# Patient Record
Sex: Male | Born: 1945
Health system: Southern US, Community
[De-identification: ages and names within clinical notes are randomized; demographics above are authoritative.]

## PROBLEM LIST (undated history)

## (undated) DIAGNOSIS — M1990 Unspecified osteoarthritis, unspecified site: Secondary | ICD-10-CM

## (undated) DIAGNOSIS — E785 Hyperlipidemia, unspecified: Secondary | ICD-10-CM

## (undated) DIAGNOSIS — E119 Type 2 diabetes mellitus without complications: Secondary | ICD-10-CM

## (undated) DIAGNOSIS — H409 Unspecified glaucoma: Secondary | ICD-10-CM

## (undated) DIAGNOSIS — N419 Inflammatory disease of prostate, unspecified: Secondary | ICD-10-CM

## (undated) DIAGNOSIS — I10 Essential (primary) hypertension: Secondary | ICD-10-CM

## (undated) HISTORY — DX: Type 2 diabetes mellitus without complications: E11.9

## (undated) HISTORY — DX: Essential (primary) hypertension: I10

## (undated) HISTORY — DX: Unspecified glaucoma: H40.9

## (undated) HISTORY — DX: Hyperlipidemia, unspecified: E78.5

## (undated) HISTORY — PX: APPENDECTOMY: SHX54

## (undated) HISTORY — DX: Unspecified osteoarthritis, unspecified site: M19.90

---

## 2006-05-18 ENCOUNTER — Ambulatory Visit: Payer: Self-pay

## 2006-05-30 ENCOUNTER — Ambulatory Visit: Payer: Self-pay | Admitting: Internal Medicine

## 2006-06-08 ENCOUNTER — Ambulatory Visit: Payer: Self-pay | Admitting: Internal Medicine

## 2006-06-08 LAB — HM COLONOSCOPY

## 2011-07-13 ENCOUNTER — Encounter: Payer: Self-pay | Admitting: Internal Medicine

## 2011-09-30 ENCOUNTER — Telehealth: Payer: Self-pay

## 2011-09-30 NOTE — Telephone Encounter (Signed)
PT WOULD LIKE TO HAVE SOME TEST SCRIPTS CALLED IN FOR HIM. WE HAD NEVER GIVEN IT TO HIM BEFORE, BUT HE WAS IN A STUDY A WHILE BACK PLEASE CALL [phone]   [facility] ON [address]

## 2011-10-02 MED ORDER — GLUCOSE BLOOD VI STRP: ORAL_STRIP | Status: AC

## 2011-10-02 NOTE — Telephone Encounter (Signed)
Sent them to pharmacy.  We will need to see pt.

## 2011-10-02 NOTE — Telephone Encounter (Signed)
PT CHART IS AT NURSES STATION FOR REVIEW.  MR [id number]

## 2011-10-02 NOTE — Telephone Encounter (Signed)
LMOM THAT RX WAS SENT IN AND NEEDS OV

## 2011-10-06 ENCOUNTER — Other Ambulatory Visit: Payer: Self-pay | Admitting: Family Medicine

## 2012-01-02 ENCOUNTER — Telehealth: Payer: Self-pay | Admitting: Medical Oncology

## 2012-01-02 NOTE — Telephone Encounter (Signed)
Wrong chart

## 2012-02-12 ENCOUNTER — Other Ambulatory Visit: Payer: Self-pay | Admitting: Internal Medicine

## 2012-02-12 ENCOUNTER — Other Ambulatory Visit: Payer: Self-pay | Admitting: Physician Assistant

## 2012-02-12 NOTE — Telephone Encounter (Signed)
Chart pulled °

## 2012-02-12 NOTE — Telephone Encounter (Signed)
Need chart

## 2012-02-19 ENCOUNTER — Other Ambulatory Visit: Payer: Self-pay | Admitting: Internal Medicine

## 2012-02-26 ENCOUNTER — Ambulatory Visit (INDEPENDENT_AMBULATORY_CARE_PROVIDER_SITE_OTHER): Payer: Medicare Other | Admitting: Emergency Medicine

## 2012-02-26 ENCOUNTER — Ambulatory Visit: Payer: Medicare Other

## 2012-02-26 VITALS — BP 131/71 | HR 87 | Temp 98.3°F | Resp 17 | Ht 73.0 in | Wt 242.0 lb

## 2012-02-26 DIAGNOSIS — J329 Chronic sinusitis, unspecified: Secondary | ICD-10-CM

## 2012-02-26 DIAGNOSIS — L989 Disorder of the skin and subcutaneous tissue, unspecified: Secondary | ICD-10-CM | POA: Diagnosis not present

## 2012-02-26 DIAGNOSIS — K047 Periapical abscess without sinus: Secondary | ICD-10-CM

## 2012-02-26 MED ORDER — AMOXICILLIN-POT CLAVULANATE 875-125 MG PO TABS: 1.0000 | ORAL_TABLET | Freq: Two times a day (BID) | ORAL | Status: AC

## 2012-02-26 NOTE — Progress Notes (Signed)
  Subjective:    Patient ID: Christopher Bauer, male    DOB: April 29, 1946, 66 y.o.   MRN: [id number]  HPI patient enters with a four-day history of pain and swelling over the left side of his face. He had broken off On his left upper premolar and has not had it repaired yet. He denies. Nasal drainage. He also has a nonhealing lesion over the right side of his face the    Review of Systems     Objective:   Physical Exam TMs are normal. Examination of the nose is normal. Examination of the right side of the face reveals a 1.5 x 0.5 cm shallow ulcerated area with indurated margins. Examination mouth revealed an exposed root of his left upper first premolar  [facility] reading (PRIMARY) by  Dr. Cleta Alberts no evidence of sinusitis .       Assessment & Plan:  I think this probably is secondary to his dental abscess and not secondary to sinusitis. I have advised him to make an appointment to see the dermatologist to remove the skin lesion on the right side of his face.

## 2012-02-26 NOTE — Patient Instructions (Addendum)
Please call first thing in the morning and get an appointment as soon as possible with your dentist. We will call you with your appointment with the dermatologist.

## 2012-03-22 ENCOUNTER — Other Ambulatory Visit: Payer: Self-pay | Admitting: Surgery

## 2012-03-22 DIAGNOSIS — C44319 Basal cell carcinoma of skin of other parts of face: Secondary | ICD-10-CM | POA: Diagnosis not present

## 2012-03-22 DIAGNOSIS — L259 Unspecified contact dermatitis, unspecified cause: Secondary | ICD-10-CM | POA: Diagnosis not present

## 2012-03-22 DIAGNOSIS — C44211 Basal cell carcinoma of skin of unspecified ear and external auricular canal: Secondary | ICD-10-CM | POA: Diagnosis not present

## 2012-03-22 DIAGNOSIS — D485 Neoplasm of uncertain behavior of skin: Secondary | ICD-10-CM | POA: Diagnosis not present

## 2012-04-03 ENCOUNTER — Other Ambulatory Visit: Payer: Self-pay | Admitting: Physician Assistant

## 2012-04-04 ENCOUNTER — Encounter: Payer: Self-pay | Admitting: Internal Medicine

## 2012-04-09 ENCOUNTER — Other Ambulatory Visit: Payer: Self-pay | Admitting: Internal Medicine

## 2012-04-13 ENCOUNTER — Ambulatory Visit (INDEPENDENT_AMBULATORY_CARE_PROVIDER_SITE_OTHER): Payer: Medicare Other | Admitting: Family Medicine

## 2012-04-13 ENCOUNTER — Other Ambulatory Visit: Payer: Self-pay | Admitting: Radiology

## 2012-04-13 VITALS — BP 128/73 | HR 82 | Temp 98.0°F | Resp 16 | Ht 73.0 in | Wt 235.6 lb

## 2012-04-13 DIAGNOSIS — M1990 Unspecified osteoarthritis, unspecified site: Secondary | ICD-10-CM

## 2012-04-13 DIAGNOSIS — E7800 Pure hypercholesterolemia, unspecified: Secondary | ICD-10-CM | POA: Diagnosis not present

## 2012-04-13 DIAGNOSIS — E663 Overweight: Secondary | ICD-10-CM | POA: Insufficient documentation

## 2012-04-13 DIAGNOSIS — E119 Type 2 diabetes mellitus without complications: Secondary | ICD-10-CM

## 2012-04-13 DIAGNOSIS — M170 Bilateral primary osteoarthritis of knee: Secondary | ICD-10-CM

## 2012-04-13 LAB — COMPREHENSIVE METABOLIC PANEL
BUN: 15 mg/dL (ref 6–23)
CO2: 24 mEq/L (ref 19–32)
Calcium: 9.9 mg/dL (ref 8.4–10.5)
Chloride: 104 mEq/L (ref 96–112)
Creat: 0.75 mg/dL (ref 0.50–1.35)

## 2012-04-13 LAB — LIPID PANEL
Cholesterol: 196 mg/dL (ref 0–200)
HDL: 45 mg/dL (ref >39)
Total CHOL/HDL Ratio: 4.4 Ratio
Triglycerides: 126 mg/dL (ref <150)

## 2012-04-13 MED ORDER — GLIPIZIDE ER 2.5 MG PO TB24: 2.5000 mg | ORAL_TABLET | Freq: Every day | ORAL | Status: DC

## 2012-04-13 MED ORDER — METFORMIN HCL 1000 MG PO TABS: 1000.0000 mg | ORAL_TABLET | Freq: Two times a day (BID) | ORAL | Status: DC

## 2012-04-13 MED ORDER — MELOXICAM 15 MG PO TABS: 15.0000 mg | ORAL_TABLET | Freq: Every day | ORAL | Status: DC | PRN

## 2012-04-13 MED ORDER — LISINOPRIL-HYDROCHLOROTHIAZIDE 20-12.5 MG PO TABS: 1.0000 | ORAL_TABLET | Freq: Every day | ORAL | Status: DC

## 2012-04-13 MED ORDER — PRAVASTATIN SODIUM 40 MG PO TABS: 40.0000 mg | ORAL_TABLET | Freq: Every day | ORAL | Status: DC

## 2012-04-13 MED ORDER — METFORMIN HCL ER (MOD) 1000 MG PO TB24: 1000.0000 mg | ORAL_TABLET | Freq: Every day | ORAL | Status: DC

## 2012-04-13 NOTE — Progress Notes (Signed)
[facility] [address] [place] [address] [phone]  Date:  04/13/2012   Name:  Christopher Bauer   DOB:  28-Nov-1945   MRN:  [id number]  PCP:  Tally Due, MD    Chief Complaint: Diabetes   History of Present Illness:  Christopher Bauer is a 66 y.o. very pleasant male patient who presents with the following:  Here today for a DM check- up.  He was told at the pharmacy that he needed an office visit to get more medication.   He also needs a RF of his mobic which he uses for OA of the knees.  However, he sometimes needs to take 2 of these a day and wonders if he could be increased to the 15 mg dosage.  He does not use these everyday- more when he has to do a lot of driving or plays golf.  He plays a fair amount of golf for exercise.    He works at the [employer].  He will get his flu shot there this year.    There is no problem list on file for this patient.   No past medical history on file.  No past surgical history on file.  History  Substance Use Topics  . Smoking status: Current Every Day Smoker -- 0.8 packs/day for 50 years    Types: Cigarettes  . Smokeless tobacco: Not on file  . Alcohol Use: Not on file    No family history on file.  No Known Allergies  Medication list has been reviewed and updated.  Current Outpatient Prescriptions on File Prior to Visit  Medication Sig Dispense Refill  . lisinopril-hydrochlorothiazide (PRINZIDE,ZESTORETIC) 20-12.5 MG per tablet Take 1 tablet by mouth daily.      . metFORMIN (GLUMETZA) 1000 MG (MOD) 24 hr tablet Take 1,000 mg by mouth daily with breakfast.      . glipiZIDE (GLUCOTROL XL) 2.5 MG 24 hr tablet Take 1 tablet (2.5 mg total) by mouth daily. Needs office visit  30 tablet  0  . glucose blood test strip Use as instructed  100 each  0  . meloxicam (MOBIC) 7.5 MG tablet TAKE 1 TABLET BY MOUTH TWICE DAILY AS NEEDED FOR PAIN  60 tablet  0  . pravastatin (PRAVACHOL) 40 MG tablet TAKE ONE TABLET  BY MOUTH DAILY AT BEDTIME FOR CHOLESTEROL  90 tablet  0    Review of Systems:  As per HPI- otherwise negative.   Physical Examination: Filed Vitals:   04/13/12 0748  BP: 128/73  Pulse: 82  Temp: 98 F (36.7 C)  Resp: 16   Filed Vitals:   04/13/12 0748  Height: 6\' 1"  (1.854 m)  Weight: 235 lb 9.6 oz (106.867 kg)   Body mass index is 31.08 kg/(m^2). Ideal Body Weight: Weight in (lb) to have BMI = 25: 189.1   GEN: WDWN, NAD, Non-toxic, A & O x 3, overweight HEENT: Atraumatic, Normocephalic. Neck supple. No masses, No LAD. Ears and Nose: No external deformity. CV: RRR, No M/G/R. No JVD. No thrill. No extra heart sounds. PULM: CTA B, no wheezes, crackles, rhonchi. No retractions. No resp. distress. No accessory muscle use. EXTR: No c/c/e.  Knees: slight crepitus and increased size of joints due to OA. No heat or effusion.  NEURO Normal gait.  PSYCH: Normally interactive. Conversant. Not depressed or anxious appearing.  Calm demeanor.   Results for orders placed in visit on 04/13/12  POCT GLYCOSYLATED HEMOGLOBIN (HGB A1C)  Component Value Range   Hemoglobin A1C 6.1      Assessment and Plan: 1. Diabetes mellitus type II  POCT glycosylated hemoglobin (Hb A1C), glipiZIDE (GLUCOTROL XL) 2.5 MG 24 hr tablet, lisinopril-hydrochlorothiazide (PRINZIDE,ZESTORETIC) 20-12.5 MG per tablet, metFORMIN (GLUMETZA) 1000 MG (MOD) 24 hr tablet  2. High cholesterol  Comprehensive metabolic panel, Lipid panel, pravastatin (PRAVACHOL) 40 MG tablet  3. Osteoarthritis  meloxicam (MOBIC) 15 MG tablet    DM is well controlled, BP looks fine. Refilled all meds today.  Cautioned re: overuse of mobic which could lead to stomach ulcers.  He agreed to use this only when needed.  Otherwise will follow- up further with his lab results, and he will get his flu shot at work this fall.   Meds ordered this encounter  Medications  . DISCONTD: metFORMIN (GLUMETZA) 1000 MG (MOD) 24 hr tablet    Sig: Take  1,000 mg by mouth daily with breakfast.  . DISCONTD: lisinopril-hydrochlorothiazide (PRINZIDE,ZESTORETIC) 20-12.5 MG per tablet    Sig: Take 1 tablet by mouth daily.  Marland Kitchen glipiZIDE (GLUCOTROL XL) 2.5 MG 24 hr tablet    Sig: Take 1 tablet (2.5 mg total) by mouth daily.    Dispense:  90 tablet    Refill:  3  . lisinopril-hydrochlorothiazide (PRINZIDE,ZESTORETIC) 20-12.5 MG per tablet    Sig: Take 1 tablet by mouth daily.    Dispense:  90 tablet    Refill:  3  . metFORMIN (GLUMETZA) 1000 MG (MOD) 24 hr tablet    Sig: Take 1 tablet (1,000 mg total) by mouth daily with breakfast.    Dispense:  90 tablet    Refill:  3  . pravastatin (PRAVACHOL) 40 MG tablet    Sig: Take 1 tablet (40 mg total) by mouth daily.    Dispense:  90 tablet    Refill:  3  . meloxicam (MOBIC) 15 MG tablet    Sig: Take 1 tablet (15 mg total) by mouth daily as needed for pain.    Dispense:  90 tablet    Refill:  2     COPLAND,JESSICA, MD

## 2012-04-17 ENCOUNTER — Encounter: Payer: Self-pay | Admitting: Family Medicine

## 2012-12-02 ENCOUNTER — Other Ambulatory Visit: Payer: Self-pay | Admitting: Family Medicine

## 2013-01-24 ENCOUNTER — Other Ambulatory Visit: Payer: Self-pay | Admitting: Physician Assistant

## 2013-02-24 ENCOUNTER — Other Ambulatory Visit: Payer: Self-pay | Admitting: Family Medicine

## 2013-03-20 ENCOUNTER — Other Ambulatory Visit: Payer: Self-pay | Admitting: Physician Assistant

## 2013-04-10 ENCOUNTER — Other Ambulatory Visit: Payer: Self-pay | Admitting: Physician Assistant

## 2013-04-13 ENCOUNTER — Other Ambulatory Visit: Payer: Self-pay | Admitting: Family Medicine

## 2013-04-15 ENCOUNTER — Other Ambulatory Visit: Payer: Self-pay | Admitting: Internal Medicine

## 2013-04-21 ENCOUNTER — Other Ambulatory Visit: Payer: Self-pay | Admitting: Internal Medicine

## 2013-04-21 ENCOUNTER — Other Ambulatory Visit: Payer: Self-pay | Admitting: Physician Assistant

## 2013-04-26 ENCOUNTER — Ambulatory Visit (INDEPENDENT_AMBULATORY_CARE_PROVIDER_SITE_OTHER): Payer: Medicare Other | Admitting: Internal Medicine

## 2013-04-26 VITALS — BP 124/72 | HR 80 | Temp 98.2°F | Resp 16 | Ht 72.0 in | Wt 229.0 lb

## 2013-04-26 DIAGNOSIS — E7800 Pure hypercholesterolemia, unspecified: Secondary | ICD-10-CM

## 2013-04-26 DIAGNOSIS — Z79899 Other long term (current) drug therapy: Secondary | ICD-10-CM | POA: Diagnosis not present

## 2013-04-26 DIAGNOSIS — I10 Essential (primary) hypertension: Secondary | ICD-10-CM | POA: Diagnosis not present

## 2013-04-26 DIAGNOSIS — E119 Type 2 diabetes mellitus without complications: Secondary | ICD-10-CM

## 2013-04-26 DIAGNOSIS — E785 Hyperlipidemia, unspecified: Secondary | ICD-10-CM | POA: Diagnosis not present

## 2013-04-26 LAB — LIPID PANEL
Cholesterol: 216 mg/dL — ABNORMAL HIGH (ref 0–200)
HDL: 53 mg/dL (ref >39)
Total CHOL/HDL Ratio: 4.1 Ratio
Triglycerides: 108 mg/dL (ref <150)
VLDL: 22 mg/dL (ref 0–40)

## 2013-04-26 LAB — POCT URINALYSIS DIPSTICK
Glucose, UA: NEGATIVE
Ketones, UA: NEGATIVE
Nitrite, UA: NEGATIVE
Protein, UA: NEGATIVE
Spec Grav, UA: 1.025
Urobilinogen, UA: 0.2

## 2013-04-26 LAB — COMPREHENSIVE METABOLIC PANEL
ALT: 44 U/L (ref 0–53)
Albumin: 4.4 g/dL (ref 3.5–5.2)
Alkaline Phosphatase: 80 U/L (ref 39–117)
BUN: 15 mg/dL (ref 6–23)
CO2: 24 mEq/L (ref 19–32)
Calcium: 9.4 mg/dL (ref 8.4–10.5)
Chloride: 102 mEq/L (ref 96–112)
Creat: 0.73 mg/dL (ref 0.50–1.35)
Glucose, Bld: 116 mg/dL — ABNORMAL HIGH (ref 70–99)
Total Bilirubin: 0.8 mg/dL (ref 0.3–1.2)

## 2013-04-26 LAB — POCT CBC
Lymph, poc: 3.2 (ref 0.6–3.4)
MCHC: 31.7 g/dL — AB (ref 31.8–35.4)
MPV: 7.1 fL (ref 0–99.8)
POC Granulocyte: 8.3 — AB (ref 2–6.9)
POC LYMPH PERCENT: 26 %L (ref 10–50)
POC MID %: 5.6 %M (ref 0–12)
RDW, POC: 14.6 %

## 2013-04-26 LAB — POCT UA - MICROSCOPIC ONLY
Casts, Ur, LPF, POC: NEGATIVE
Crystals, Ur, HPF, POC: NEGATIVE
Mucus, UA: POSITIVE
Yeast, UA: NEGATIVE

## 2013-04-26 LAB — POCT GLYCOSYLATED HEMOGLOBIN (HGB A1C): Hemoglobin A1C: 112

## 2013-04-26 LAB — TSH: TSH: 0.981 u[IU]/mL (ref 0.350–4.500)

## 2013-04-26 MED ORDER — LISINOPRIL-HYDROCHLOROTHIAZIDE 20-12.5 MG PO TABS: 1.0000 | ORAL_TABLET | Freq: Every day | ORAL | Status: DC

## 2013-04-26 MED ORDER — METFORMIN HCL 1000 MG PO TABS: 1000.0000 mg | ORAL_TABLET | Freq: Two times a day (BID) | ORAL | Status: DC

## 2013-04-26 MED ORDER — GLIPIZIDE ER 2.5 MG PO TB24: 2.5000 mg | ORAL_TABLET | Freq: Every day | ORAL | Status: DC

## 2013-04-26 MED ORDER — PRAVASTATIN SODIUM 40 MG PO TABS: 40.0000 mg | ORAL_TABLET | Freq: Every day | ORAL | Status: DC

## 2013-04-26 NOTE — Progress Notes (Signed)
°  Subjective:    Patient ID: Christopher Bauer, male    DOB: March 28, 1946, 67 y.o.   MRN: [id number]  HPI 67 year old male presents with the need of a diabetes check and would also like medications to be refilled.    Review of Systems     Objective:   Physical Exam        Assessment & Plan:

## 2013-04-26 NOTE — Progress Notes (Signed)
  Subjective:    Patient ID: Christopher Bauer, male    DOB: February 19, 1946, 67 y.o.   MRN: [id number]  HPI Doing well diabetes controlled by home glucose hx. No problems with meds. Has lost 15 lbs Feels good.   Review of Systems Had prostate procedure with urologist, unsure what and why    Objective:   Physical Exam  Vitals reviewed. Constitutional: He is oriented to person, place, and time. He appears well-developed and well-nourished.  HENT:  Head: Normocephalic.  Eyes: EOM are normal.  Cardiovascular: Normal rate, regular rhythm and normal heart sounds.   Pulmonary/Chest: Effort normal and breath sounds normal.  Neurological: He is alert and oriented to person, place, and time. He has normal strength. No cranial nerve deficit or sensory deficit.  Skin: No rash noted.  Psychiatric: He has a normal mood and affect. His behavior is normal.          Assessment & Plan:  RF meds 1 yr CPE 6 mos.

## 2013-04-26 NOTE — Patient Instructions (Addendum)
2000 Calorie Diabetic Diet The 2000 calorie diabetic diet is designed for eating up to 2000 calories each day. Following this diet and making healthy meal choices can help improve overall health. It controls blood glucose (sugar) levels. It can also lower blood pressure and cholesterol. SERVING SIZES Measuring foods and serving sizes helps to make sure you are getting the right amount of food. The list below tells how big or small some common serving sizes are.  1 oz.........4 stacked dice.  3 oz.........Deck of cards.  1 tsp........Tip of little finger.  1 tbs........Thumb.  2 tbs........Golf ball.   cup.......Half of a fist.  1 cup........A fist. GUIDELINES FOR CHOOSING FOODS The goal of this diet is to eat a variety of foods and limit calories to 2000 each day. This can be done by choosing foods that are low in calories and fat. The diet also suggests eating small amounts of food often. Doing this helps control your blood glucose levels so they do not get too high or too low. Each meal or snack should contain a protein food source to help you feel more satisfied and to stabilize your blood glucose. Try to eat about the same amount of food around the same time each day. This includes weekend days, travel days, and days off work. Space your meals about 4 to 5 hours apart and add a snack between them if you wish. For example, a daily food plan could include breakfast, a morning snack, lunch, dinner, and an evening snack. Healthy meals and snacks include whole grains, vegetables, fruits, lean meats, poultry, fish, and dairy products. As you plan your meals, choose a variety of foods. Choose from the bread and starches, vegetables, fruit, dairy, and meat/protein groups. Examples of foods from each group are listed below with their suggested serving sizes. Use measuring cups and spoons to become familiar with what a healthy portion looks like. Bread and Starches Each serving equals 15 grams of  carbohydrates.  1 slice bread.   bagel.   cup or 1 cup cold cereal (unsweetened).   cup hot cereal or mashed potatoes.  1 small potato (size of a computer mouse).   cup cooked pasta or rice.   English muffin.  1 cup broth-based soup.  3 cups popcorn.  4 to 6 whole-wheat crackers.   cup cooked beans, peas, or corn. Vegetables Each serving equals 5 grams of carbohydrates.   cup cooked vegetables.  1 cup raw vegetables.   cup tomato juice. Fruit Each serving equals 15 grams of carbohydrates.  1 small apple, banana, or orange.  1  cup watermelon or strawberries.   cup applesauce (no sugar added).  2 tbs raisins.   banana.   cup unsweetened canned fruit.   cup unsweetened fruit juice. Dairy Each serving equals 12 to 15 grams of carbohydrates.  1 cup fat-free milk.  6 oz artificially sweetened yogurt.  1 cup buttermilk.  1 cup soy milk. Meat/Protein  1 large egg.  2 to 3 oz meat, poultry, or fish.   cup cottage cheese.  1 tbs peanut butter.   cup tofu.  1 oz cheese.   cup tuna canned in water. SAMPLE 2000 CALORIE DIET PLAN Breakfast  1 English muffin (2 carb servings).  Reduced fat cream cheese, 1 tbs.  1 scrambled egg.   grapefruit (1 carb serving).  Fat-free milk, 1 cup (1 carb serving). Morning Snack  Artificially sweetened yogurt, 6 oz (1 carb serving).  2 tbs chopped nuts.  1   small peach (1 carb serving). Lunch  Grilled chicken sandwich.  1 hamburger bun (2 carb servings).  2 oz chicken breast.  1 lettuce leaf.  2 slices tomato.  Reduced fat mayonnaise, 1 tbs.  Carrot sticks, 1 cup.  Celery, 1 cup.  1 small apple (1 carb serving).  Fat-free milk, 1 cup (1 carb serving). Afternoon Snack   cup low-fat cottage cheese.  1  cups strawberries (1 carb serving). Dinner  Steak fajitas.  2 oz lean steak.  1 whole-wheat tortilla, 8 inches (1  carb servings).  Shredded lettuce,   cup.  2 slices tomato.  Salsa,  cup.  Low-fat sour cream, 2 tbs.  Brown rice,  cup (1 carb serving).  1 small orange (1 carb serving). Evening Snack  4 reduced fat whole-wheat crackers (1 carb serving).  1 tbs peanut butter.  12 to 15 grapes (1 carb serving). MEAL PLAN Use this worksheet to help you make a daily meal plan based on the 2000 calorie diabetic diet suggestions. The total amount of carbohydrates in your meal or snack is more important than making sure you include all of the food groups at every meal or snack. If you are using this plan to help you control your blood glucose, you may interchange carbohydrate containing foods (dairy, starches, and fruits). Choose a variety of fresh foods of varying colors and flavors. You can choose from the following foods to build your day's meals:  11 Starches.  4 Vegetables.  3 Fruits.  3 Dairy.  8 oz Meat.  Up to 6 Fats. Your dietician can use this worksheet to help you decide how many servings and what types of foods are right for you. BREAKFAST Food Group and Servings / Food Choice Starches ___________________________________________ Dairy ______________________________________________ Fruit ______________________________________________ Meat ______________________________________________ Fat________________________________________________ LUNCH Food Group and Servings / Food Choice Starch _____________________________________________ Meat ______________________________________________ Vegetables _________________________________________ Fruit ______________________________________________ Dairy______________________________________________ Fat________________________________________________ Aura Fey Food Group and Servings / Food  Choice Starch________________________________________________ Meat_________________________________________________ Fruit__________________________________________________ Linford Arnold Group and Servings / Food Choice Starches ____________________________________________ Meat _______________________________________________ Dairy _______________________________________________ Vegetables __________________________________________ Fruit ________________________________________________ Fat_________________________________________________ Lollie Sails Food Group and Servings / Food Choice Fruit _______________________________________________ Meat _______________________________________________ Starch ______________________________________________ DAILY TOTALS Starches ________________________ Vegetables ______________________ Fruit ___________________________ Dairy ___________________________ Meat ___________________________ Fat _____________________________ Document Released: 01/17/2005 Document Revised: 09/19/2011 Document Reviewed: 02/02/2009 [employer] Patient Information 2014 [employer] Calorie Counting Diet A calorie counting diet requires you to eat the number of calories that are right for you in a day. Calories are the measurement of how much energy you get from the food you eat. Eating the right amount of calories is important for staying at a healthy weight. If you eat too many calories, your body will store them as fat and you may gain weight. If you eat too few calories, you may lose weight. Counting the number of calories you eat during a day will help you know if you are eating the right amount. A Registered Dietitian can determine how many calories you need in a day. The amount of calories needed varies from person to person. If your goal is to lose weight, you will need to eat fewer calories. Losing weight can benefit you if you are overweight or have health problems such  as heart disease, high blood pressure, or diabetes. If your goal is to gain weight, you will need to eat more calories. Gaining weight may be necessary if you have a certain health problem that causes your body to need more energy. TIPS Whether  you are increasing or decreasing the number of calories you eat during a day, it may be hard to get used to changes in what you eat and drink. The following are tips to help you keep track of the number of calories you eat.  Measure foods at home with measuring cups. This helps you know the amount of food and number of calories you are eating.  Restaurants often serve food in amounts that are larger than 1 serving. While eating out, estimate how many servings of a food you are given. For example, a serving of cooked rice is  cup or about the size of half of a fist. Knowing serving sizes will help you be aware of how much food you are eating at restaurants.  Ask for smaller portion sizes or child-size portions at restaurants.  Plan to eat half of a meal at a restaurant. Take the rest home or share the other half with a friend.  Read the Nutrition Facts panel on food labels for calorie content and serving size. You can find out how many servings are in a package, the size of a serving, and the number of calories each serving has.  For example, a package might contain 3 cookies. The Nutrition Facts panel on that package says that 1 serving is 1 cookie. Below that, it will say there are 3 servings in the container. The calories section of the Nutrition Facts label says there are 90 calories. This means there are 90 calories in 1 cookie (1 serving). If you eat 1 cookie you have eaten 90 calories. If you eat all 3 cookies, you have eaten 270 calories (3 servings x 90 calories = 270 calories). The list below tells you how big or small some common portion sizes are.  1 oz.........4 stacked dice.  3 oz........Marland KitchenDeck of cards.  1 tsp.......Marland KitchenTip of little  finger.  1 tbs......Marland KitchenMarland KitchenThumb.  2 tbs.......Marland KitchenGolf ball.   cup......Marland KitchenHalf of a fist.  1 cup.......Marland KitchenA fist. KEEP A FOOD LOG Write down every food item you eat, the amount you eat, and the number of calories in each food you eat during the day. At the end of the day, you can add up the total number of calories you have eaten. It may help to keep a list like the one below. Find out the calorie information by reading the Nutrition Facts panel on food labels. Breakfast  Bran cereal (1 cup, 110 calories).  Fat-free milk ( cup, 45 calories). Snack  Apple (1 medium, 80 calories). Lunch  Spinach (1 cup, 20 calories).  Tomato ( medium, 20 calories).  Chicken breast strips (3 oz, 165 calories).  Shredded cheddar cheese ( cup, 110 calories).  Light [place] dressing (2 tbs, 60 calories).  Whole-wheat bread (1 slice, 80 calories).  Tub margarine (1 tsp, 35 calories).  Vegetable soup (1 cup, 160 calories). Dinner  Pork chop (3 oz, 190 calories).  Brown rice (1 cup, 215 calories).  Steamed broccoli ( cup, 20 calories).  Strawberries (1  cup, 65 calories).  Whipped cream (1 tbs, 50 calories). Daily Calorie Total: 1425 Document Released: 06/27/2005 Document Revised: 09/19/2011 Document Reviewed: 12/22/2006 [facility] Patient Information 2014 [place], [place].

## 2013-04-27 LAB — MICROALBUMIN, URINE: Microalb, Ur: 1.07 mg/dL (ref 0.00–1.89)

## 2013-04-27 LAB — PSA, MEDICARE: PSA: 0.81 ng/mL (ref <=4.00)

## 2013-04-28 ENCOUNTER — Encounter: Payer: Self-pay | Admitting: *Deleted

## 2013-08-06 ENCOUNTER — Other Ambulatory Visit: Payer: Self-pay | Admitting: Family Medicine

## 2013-10-06 ENCOUNTER — Encounter: Payer: Self-pay | Admitting: Internal Medicine

## 2013-10-06 ENCOUNTER — Ambulatory Visit (INDEPENDENT_AMBULATORY_CARE_PROVIDER_SITE_OTHER): Payer: Medicare Other | Admitting: Internal Medicine

## 2013-10-06 VITALS — BP 142/72 | HR 121 | Temp 98.0°F | Ht 72.0 in | Wt 237.2 lb

## 2013-10-06 DIAGNOSIS — J029 Acute pharyngitis, unspecified: Secondary | ICD-10-CM | POA: Diagnosis not present

## 2013-10-06 DIAGNOSIS — M542 Cervicalgia: Secondary | ICD-10-CM | POA: Diagnosis not present

## 2013-10-06 DIAGNOSIS — E119 Type 2 diabetes mellitus without complications: Secondary | ICD-10-CM

## 2013-10-06 LAB — POCT CBC
GRANULOCYTE PERCENT: 65.8 % (ref 37–80)
HEMATOCRIT: 48.2 % (ref 43.5–53.7)
Hemoglobin: 15.5 g/dL (ref 14.1–18.1)
Lymph, poc: 3.1 (ref 0.6–3.4)
MCH, POC: 31 pg (ref 27–31.2)
MCHC: 32.2 g/dL (ref 31.8–35.4)
MCV: 96.4 fL (ref 80–97)
MID (cbc): 0.7 (ref 0–0.9)
MPV: 7.4 fL (ref 0–99.8)
PLATELET COUNT, POC: 483 10*3/uL — AB (ref 142–424)
POC GRANULOCYTE: 7.3 — AB (ref 2–6.9)
POC LYMPH %: 28 % (ref 10–50)
POC MID %: 6.2 %M (ref 0–12)
RBC: 5 M/uL (ref 4.69–6.13)
RDW, POC: 14.3 %
WBC: 11.1 10*3/uL — AB (ref 4.6–10.2)

## 2013-10-06 LAB — POCT RAPID STREP A (OFFICE): RAPID STREP A SCREEN: NEGATIVE

## 2013-10-06 LAB — GLUCOSE, POCT (MANUAL RESULT ENTRY): POC GLUCOSE: 169 mg/dL — AB (ref 70–99)

## 2013-10-06 LAB — POCT GLYCOSYLATED HEMOGLOBIN (HGB A1C): HEMOGLOBIN A1C: 5.7

## 2013-10-06 MED ORDER — AZITHROMYCIN 500 MG PO TABS: 500.0000 mg | ORAL_TABLET | Freq: Every day | ORAL | Status: DC

## 2013-10-06 MED ORDER — METHOCARBAMOL 750 MG PO TABS: 750.0000 mg | ORAL_TABLET | Freq: Four times a day (QID) | ORAL | Status: DC

## 2013-10-06 NOTE — Progress Notes (Signed)
   Subjective:    Patient ID: Christopher Bauer, male    DOB: 27-Aug-1945, 68 y.o.   MRN: [id number]  HPI 68 year old male complains of ear pain. He has had this pain for 3 days. It is only the right ear and it feels like it has fluid in it. He has difficulty turning his head to the right. He also states when he touches under his right earlobe he can feel pain going down his neck. He has tried Aleve for the pain but it only helped a little. Pain is deep right neck, no hearing loss. No fever, chills. Some pain with swallowing, no congestion or cough but does smoke   Review of Systems     Objective:   Physical Exam  Constitutional: He is oriented to person, place, and time. He appears well-developed and well-nourished. No distress.  HENT:  Head: Normocephalic.  Right Ear: External ear normal.  Left Ear: External ear normal.  Nose: Nose normal.  Mouth/Throat: Uvula is midline and mucous membranes are normal. Uvula swelling present. Posterior oropharyngeal erythema present. No oropharyngeal exudate.  Eyes: EOM are normal. Pupils are equal, round, and reactive to light.  Neck: Phonation normal. Tracheal tenderness and muscular tenderness present. Decreased range of motion present. No mass and no thyromegaly present.    Cardiovascular: Normal rate.   Pulmonary/Chest: Effort normal and breath sounds normal.  Musculoskeletal: He exhibits tenderness.       Cervical back: He exhibits decreased range of motion, tenderness, pain and spasm. He exhibits no bony tenderness, no swelling, no edema, no deformity, no laceration and normal pulse.  Neurological: He is alert and oriented to person, place, and time. He exhibits normal muscle tone. Coordination normal.  Psychiatric: He has a normal mood and affect. His behavior is normal. Judgment and thought content normal.     Results for orders placed in visit on 10/06/13  POCT CBC      Result Value Ref Range   WBC 11.1 (*) 4.6 - 10.2 K/uL   Lymph, poc 3.1   0.6 - 3.4   POC LYMPH PERCENT 28.0  10 - 50 %L   MID (cbc) 0.7  0 - 0.9   POC MID % 6.2  0 - 12 %M   POC Granulocyte 7.3 (*) 2 - 6.9   Granulocyte percent 65.8  37 - 80 %G   RBC 5.00  4.69 - 6.13 M/uL   Hemoglobin 15.5  14.1 - 18.1 g/dL   HCT, POC 48.2  43.5 - 53.7 %   MCV 96.4  80 - 97 fL   MCH, POC 31.0  27 - 31.2 pg   MCHC 32.2  31.8 - 35.4 g/dL   RDW, POC 14.3     Platelet Count, POC 483 (*) 142 - 424 K/uL   MPV 7.4  0 - 99.8 fL  GLUCOSE, POCT (MANUAL RESULT ENTRY)      Result Value Ref Range   POC Glucose 169 (*) 70 - 99 mg/dl  POCT GLYCOSYLATED HEMOGLOBIN (HGB A1C)      Result Value Ref Range   Hemoglobin A1C 5.7    POCT RAPID STREP A (OFFICE)      Result Value Ref Range   Rapid Strep A Screen Negative  Negative        Assessment & Plan:  Possible throat infection Quit smoking Zithromax 500mg  5d Robaxin 750mg  prn neck spasms

## 2013-10-06 NOTE — Progress Notes (Signed)
   Subjective:    Patient ID: Christopher Bauer, male    DOB: Nov 28, 1945, 68 y.o.   MRN: [id number]  HPI    Review of Systems     Objective:   Physical Exam        Assessment & Plan:

## 2013-10-06 NOTE — Patient Instructions (Signed)
Sore Throat A sore throat is pain, burning, irritation, or scratchiness of the throat. There is often pain or tenderness when swallowing or talking. A sore throat may be accompanied by other symptoms, such as coughing, sneezing, fever, and swollen neck glands. A sore throat is often the first sign of another sickness, such as a cold, flu, strep throat, or mononucleosis (commonly known as mono). Most sore throats go away without medical treatment. CAUSES  The most common causes of a sore throat include:  A viral infection, such as a cold, flu, or mono.  A bacterial infection, such as strep throat, tonsillitis, or whooping cough.  Seasonal allergies.  Dryness in the air.  Irritants, such as smoke or pollution.  Gastroesophageal reflux disease (GERD). HOME CARE INSTRUCTIONS   Only take over-the-counter medicines as directed by your caregiver.  Drink enough fluids to keep your urine clear or pale yellow.  Rest as needed.  Try using throat sprays, lozenges, or sucking on hard candy to ease any pain (if older than 4 years or as directed).  Sip warm liquids, such as broth, herbal tea, or warm water with honey to relieve pain temporarily. You may also eat or drink cold or frozen liquids such as frozen ice pops.  Gargle with salt water (mix 1 tsp salt with 8 oz of water).  Do not smoke and avoid secondhand smoke.  Put a cool-mist humidifier in your bedroom at night to moisten the air. You can also turn on a hot shower and sit in the bathroom with the door closed for 5 10 minutes. SEEK IMMEDIATE MEDICAL CARE IF:  You have difficulty breathing.  You are unable to swallow fluids, soft foods, or your saliva.  You have increased swelling in the throat.  Your sore throat does not get better in 7 days.  You have nausea and vomiting.  You have a fever or persistent symptoms for more than 2 3 days.  You have a fever and your symptoms suddenly get worse. MAKE SURE YOU:   Understand  these instructions.  Will watch your condition.  Will get help right away if you are not doing well or get worse. Document Released: 08/04/2004 Document Revised: 06/13/2012 Document Reviewed: 03/04/2012 [facility] Patient Information 2014 [place], [place]. Smoking Cessation Quitting smoking is important to your health and has many advantages. However, it is not always easy to quit since nicotine is a very addictive drug. Often times, people try 3 times or more before being able to quit. This document explains the best ways for you to prepare to quit smoking. Quitting takes hard work and a lot of effort, but you can do it. ADVANTAGES OF QUITTING SMOKING  You will live longer, feel better, and live better.  Your body will feel the impact of quitting smoking almost immediately.  Within 20 minutes, blood pressure decreases. Your pulse returns to its normal level.  After 8 hours, carbon monoxide levels in the blood return to normal. Your oxygen level increases.  After 24 hours, the chance of having a heart attack starts to decrease. Your breath, hair, and body stop smelling like smoke.  After 48 hours, damaged nerve endings begin to recover. Your sense of taste and smell improve.  After 72 hours, the body is virtually free of nicotine. Your bronchial tubes relax and breathing becomes easier.  After 2 to 12 weeks, lungs can hold more air. Exercise becomes easier and circulation improves.  The risk of having a heart attack, stroke, cancer, or lung  disease is greatly reduced.  After 1 year, the risk of coronary heart disease is cut in half.  After 5 years, the risk of stroke falls to the same as a nonsmoker.  After 10 years, the risk of lung cancer is cut in half and the risk of other cancers decreases significantly.  After 15 years, the risk of coronary heart disease drops, usually to the level of a nonsmoker.  If you are pregnant, quitting smoking will improve your chances of having a  healthy baby.  The people you live with, especially any children, will be healthier.  You will have extra money to spend on things other than cigarettes. QUESTIONS TO THINK ABOUT BEFORE ATTEMPTING TO QUIT You may want to talk about your answers with your caregiver.  Why do you want to quit?  If you tried to quit in the past, what helped and what did not?  What will be the most difficult situations for you after you quit? How will you plan to handle them?  Who can help you through the tough times? Your family? Friends? A caregiver?  What pleasures do you get from smoking? What ways can you still get pleasure if you quit? Here are some questions to ask your caregiver:  How can you help me to be successful at quitting?  What medicine do you think would be best for me and how should I take it?  What should I do if I need more help?  What is smoking withdrawal like? How can I get information on withdrawal? GET READY  Set a quit date.  Change your environment by getting rid of all cigarettes, ashtrays, matches, and lighters in your home, car, or work. Do not let people smoke in your home.  Review your past attempts to quit. Think about what worked and what did not. GET SUPPORT AND ENCOURAGEMENT You have a better chance of being successful if you have help. You can get support in many ways.  Tell your family, friends, and co-workers that you are going to quit and need their support. Ask them not to smoke around you.  Get individual, group, or telephone counseling and support. Programs are available at [employer] and health centers. Call your local health department for information about programs in your area.  Spiritual beliefs and practices may help some smokers quit.  Download a "quit meter" on your computer to keep track of quit statistics, such as how long you have gone without smoking, cigarettes not smoked, and money saved.  Get a self-help book about quitting smoking  and staying off of tobacco. [place] yourself from urges to smoke. Talk to someone, go for a walk, or occupy your time with a task.  Change your normal routine. Take a different route to work. Drink tea instead of coffee. Eat breakfast in a different place.  Reduce your stress. Take a hot bath, exercise, or read a book.  Plan something enjoyable to do every day. Reward yourself for not smoking.  Explore interactive web-based programs that specialize in helping you quit. GET MEDICINE AND USE IT CORRECTLY Medicines can help you stop smoking and decrease the urge to smoke. Combining medicine with the above behavioral methods and support can greatly increase your chances of successfully quitting smoking.  Nicotine replacement therapy helps deliver nicotine to your body without the negative effects and risks of smoking. Nicotine replacement therapy includes nicotine gum, lozenges, inhalers, nasal sprays, and skin patches. Some may  be available over-the-counter and others require a prescription.  Antidepressant medicine helps people abstain from smoking, but how this works is unknown. This medicine is available by prescription.  Nicotinic receptor partial agonist medicine simulates the effect of nicotine in your brain. This medicine is available by prescription. Ask your caregiver for advice about which medicines to use and how to use them based on your health history. Your caregiver will tell you what side effects to look out for if you choose to be on a medicine or therapy. Carefully read the information on the package. Do not use any other product containing nicotine while using a nicotine replacement product.  RELAPSE OR DIFFICULT SITUATIONS Most relapses occur within the first 3 months after quitting. Do not be discouraged if you start smoking again. Remember, most people try several times before finally quitting. You may have symptoms of withdrawal because your  body is used to nicotine. You may crave cigarettes, be irritable, feel very hungry, cough often, get headaches, or have difficulty concentrating. The withdrawal symptoms are only temporary. They are strongest when you first quit, but they will go away within 10 14 days. To reduce the chances of relapse, try to:  Avoid drinking alcohol. Drinking lowers your chances of successfully quitting.  Reduce the amount of caffeine you consume. Once you quit smoking, the amount of caffeine in your body increases and can give you symptoms, such as a rapid heartbeat, sweating, and anxiety.  Avoid smokers because they can make you want to smoke.  Do not let weight gain distract you. Many smokers will gain weight when they quit, usually less than 10 pounds. Eat a healthy diet and stay active. You can always lose the weight gained after you quit.  Find ways to improve your mood other than smoking. FOR MORE INFORMATION  www.smokefree.gov  Document Released: 06/21/2001 Document Revised: 12/27/2011 Document Reviewed: 10/06/2011 [facility] Patient Information 2014 [place], [place].

## 2014-04-13 ENCOUNTER — Other Ambulatory Visit: Payer: Self-pay | Admitting: Internal Medicine

## 2014-04-14 ENCOUNTER — Other Ambulatory Visit: Payer: Self-pay | Admitting: Family Medicine

## 2014-04-14 ENCOUNTER — Other Ambulatory Visit: Payer: Self-pay | Admitting: Internal Medicine

## 2014-04-23 ENCOUNTER — Telehealth: Payer: Self-pay | Admitting: *Deleted

## 2014-04-23 NOTE — Telephone Encounter (Signed)
Called patient and advised him to come to the walk in clinic at [address] for follow up on diabetes. Per patient he will come on his next day off from work.

## 2014-05-01 ENCOUNTER — Ambulatory Visit (INDEPENDENT_AMBULATORY_CARE_PROVIDER_SITE_OTHER): Payer: Medicare Other

## 2014-05-01 ENCOUNTER — Ambulatory Visit (INDEPENDENT_AMBULATORY_CARE_PROVIDER_SITE_OTHER): Payer: Medicare Other | Admitting: Emergency Medicine

## 2014-05-01 ENCOUNTER — Other Ambulatory Visit: Payer: Self-pay | Admitting: Emergency Medicine

## 2014-05-01 VITALS — BP 120/64 | HR 88 | Temp 98.3°F | Resp 18 | Ht 73.0 in | Wt 237.0 lb

## 2014-05-01 DIAGNOSIS — F17200 Nicotine dependence, unspecified, uncomplicated: Secondary | ICD-10-CM

## 2014-05-01 DIAGNOSIS — I10 Essential (primary) hypertension: Secondary | ICD-10-CM

## 2014-05-01 DIAGNOSIS — Z23 Encounter for immunization: Secondary | ICD-10-CM | POA: Diagnosis not present

## 2014-05-01 DIAGNOSIS — Z79899 Other long term (current) drug therapy: Secondary | ICD-10-CM

## 2014-05-01 DIAGNOSIS — R938 Abnormal findings on diagnostic imaging of other specified body structures: Secondary | ICD-10-CM

## 2014-05-01 DIAGNOSIS — Z720 Tobacco use: Secondary | ICD-10-CM | POA: Diagnosis not present

## 2014-05-01 DIAGNOSIS — E7800 Pure hypercholesterolemia, unspecified: Secondary | ICD-10-CM

## 2014-05-01 DIAGNOSIS — E119 Type 2 diabetes mellitus without complications: Secondary | ICD-10-CM | POA: Diagnosis not present

## 2014-05-01 DIAGNOSIS — Z7185 Encounter for immunization safety counseling: Secondary | ICD-10-CM

## 2014-05-01 DIAGNOSIS — Z7189 Other specified counseling: Secondary | ICD-10-CM | POA: Diagnosis not present

## 2014-05-01 DIAGNOSIS — E785 Hyperlipidemia, unspecified: Secondary | ICD-10-CM | POA: Diagnosis not present

## 2014-05-01 DIAGNOSIS — R9389 Abnormal findings on diagnostic imaging of other specified body structures: Secondary | ICD-10-CM | POA: Insufficient documentation

## 2014-05-01 LAB — COMPLETE METABOLIC PANEL WITH GFR
ALK PHOS: 65 U/L (ref 39–117)
ALT: 41 U/L (ref 0–53)
AST: 26 U/L (ref 0–37)
Albumin: 4 g/dL (ref 3.5–5.2)
BUN: 16 mg/dL (ref 6–23)
CALCIUM: 9.6 mg/dL (ref 8.4–10.5)
CO2: 23 mEq/L (ref 19–32)
Chloride: 105 mEq/L (ref 96–112)
Creat: 0.82 mg/dL (ref 0.50–1.35)
GFR, Est African American: >89 mL/min
GLUCOSE: 133 mg/dL — AB (ref 70–99)
POTASSIUM: 4.5 meq/L (ref 3.5–5.3)
Sodium: 137 mEq/L (ref 135–145)
Total Bilirubin: 0.7 mg/dL (ref 0.2–1.2)
Total Protein: 6.3 g/dL (ref 6.0–8.3)

## 2014-05-01 LAB — MICROALBUMIN, URINE: Microalb, Ur: 0.5 mg/dL (ref <2.0)

## 2014-05-01 LAB — POCT CBC
Granulocyte percent: 61.7 %G (ref 37–80)
HCT, POC: 48.8 % (ref 43.5–53.7)
HEMOGLOBIN: 15.9 g/dL (ref 14.1–18.1)
LYMPH, POC: 3.4 (ref 0.6–3.4)
MCH: 30.8 pg (ref 27–31.2)
MCHC: 32.6 g/dL (ref 31.8–35.4)
MCV: 94.4 fL (ref 80–97)
MID (cbc): 0.4 (ref 0–0.9)
MPV: 6.1 fL (ref 0–99.8)
POC Granulocyte: 6 (ref 2–6.9)
POC LYMPH %: 34.6 % (ref 10–50)
POC MID %: 3.7 % (ref 0–12)
Platelet Count, POC: 425 10*3/uL — AB (ref 142–424)
RBC: 5.17 M/uL (ref 4.69–6.13)
RDW, POC: 14.6 %
WBC: 9.8 10*3/uL (ref 4.6–10.2)

## 2014-05-01 LAB — LIPID PANEL
CHOL/HDL RATIO: 4.6 ratio
CHOLESTEROL: 201 mg/dL — AB (ref 0–200)
HDL: 44 mg/dL (ref >39)
LDL Cholesterol: 128 mg/dL — ABNORMAL HIGH (ref 0–99)
Triglycerides: 146 mg/dL (ref <150)
VLDL: 29 mg/dL (ref 0–40)

## 2014-05-01 LAB — GLUCOSE, POCT (MANUAL RESULT ENTRY): POC Glucose: 131 mg/dl — AB (ref 70–99)

## 2014-05-01 LAB — POCT GLYCOSYLATED HEMOGLOBIN (HGB A1C): Hemoglobin A1C: 5.7

## 2014-05-01 MED ORDER — METFORMIN HCL 1000 MG PO TABS: ORAL_TABLET | ORAL | Status: DC

## 2014-05-01 MED ORDER — LISINOPRIL-HYDROCHLOROTHIAZIDE 20-12.5 MG PO TABS: 1.0000 | ORAL_TABLET | Freq: Every day | ORAL | Status: DC

## 2014-05-01 MED ORDER — PRAVASTATIN SODIUM 40 MG PO TABS: 40.0000 mg | ORAL_TABLET | Freq: Every day | ORAL | Status: DC

## 2014-05-01 NOTE — Progress Notes (Addendum)
Subjective:    Patient ID: Christopher Bauer, male    DOB: Jun 12, 1946, 68 y.o.   MRN: [id number] This chart was scribed for Arlyss Queen, MD by Marti Sleigh, Medical Scribe. This patient was seen in Room 10 and the patient's care was started at 8:10 AM.  HPI HPI Comments: Christopher Bauer is a 68 y.o. male with a past hx of controlled DM and HLD who presents to the Emergency Department complaining of for a diabetes check up. Pt states he has been taking accidentally 1000mg  of metformin per day, which is double his prescription. Pt states he has felt fine, but recently realized that he was supposed to only take a half pill, and has been taking a whole pill. Pt states he has been checking his blood sugar, and it has been 107-108. Pt states last time he came in for his A1C was six months ago, and it was 5.7. Pt states he is not sure whether he has completed a colonoscopy in the last two years. Pt's previous PCP was Dr. Elder Cyphers. Pt smokes .5 PPD for the last 25 years. Pt states he would like a pneumonia vaccine shot.    Review of Systems  Constitutional: Negative for fever and chills.  Gastrointestinal: Negative for nausea, vomiting, diarrhea and constipation.  Skin: Negative for color change and rash.  Neurological: Negative for dizziness and headaches.       Objective:   Physical Exam  Nursing note and vitals reviewed. Constitutional: He is oriented to person, place, and time. He appears well-developed and well-nourished.  HENT:  Head: Normocephalic and atraumatic.  Eyes: Pupils are equal, round, and reactive to light.  Neck: No JVD present.  Cardiovascular: Normal rate and regular rhythm.   Pulmonary/Chest: Effort normal and breath sounds normal. No respiratory distress.  Neurological: He is alert and oriented to person, place, and time.  Skin: Skin is warm and dry.  Psychiatric: He has a normal mood and affect. His behavior is normal.   [facility] reading (PRIMARY) by  Dr.Decklan Mau there is some elevation  of the right hemidiaphragm. There is a questionable three-quarter centimeter nodule seen on lateral view please comment. Results for orders placed in visit on 05/01/14  POCT CBC      Result Value Ref Range   WBC 9.8  4.6 - 10.2 K/uL   Lymph, poc 3.4  0.6 - 3.4   POC LYMPH PERCENT 34.6  10 - 50 %L   MID (cbc) 0.4  0 - 0.9   POC MID % 3.7  0 - 12 %M   POC Granulocyte 6.0  2 - 6.9   Granulocyte percent 61.7  37 - 80 %G   RBC 5.17  4.69 - 6.13 M/uL   Hemoglobin 15.9  14.1 - 18.1 g/dL   HCT, POC 48.8  43.5 - 53.7 %   MCV 94.4  80 - 97 fL   MCH, POC 30.8  27 - 31.2 pg   MCHC 32.6  31.8 - 35.4 g/dL   RDW, POC 14.6     Platelet Count, POC 425 (*) 142 - 424 K/uL   MPV 6.1  0 - 99.8 fL  GLUCOSE, POCT (MANUAL RESULT ENTRY)      Result Value Ref Range   POC Glucose 131 (*) 70 - 99 mg/dl  POCT GLYCOSYLATED HEMOGLOBIN (HGB A1C)      Result Value Ref Range   Hemoglobin A1C 5.7     Meds ordered this encounter  Medications  . pravastatin (PRAVACHOL)  40 MG tablet    Sig: Take 1 tablet (40 mg total) by mouth daily.    Dispense:  90 tablet    Refill:  3  . lisinopril-hydrochlorothiazide (PRINZIDE,ZESTORETIC) 20-12.5 MG per tablet    Sig: Take 1 tablet by mouth daily.    Dispense:  90 tablet    Refill:  3  . metFORMIN (GLUCOPHAGE) 1000 MG tablet    Sig: Take one half tablet twice a day    Dispense:  90 tablet    Refill:  3    **Patient requests 90 days supply**      Assessment & Plan:  Patient doing great.. Other medication for refill. There is a suspicious nodule on chest x-ray.. Order CT chest. I stopped his sulfonylurea. He will take metformin at a half tablet twice a day. Prevnar was given.I personally performed the services described in this documentation, which was scribed in my presence. The recorded information has been reviewed and is accurate.

## 2014-05-12 ENCOUNTER — Ambulatory Visit
Admission: RE | Admit: 2014-05-12 | Discharge: 2014-05-12 | Disposition: A | Discharge status: Home / Self Care | Payer: Medicare Other | Source: Ambulatory Visit | Attending: Emergency Medicine | Admitting: Emergency Medicine

## 2014-05-12 DIAGNOSIS — R9389 Abnormal findings on diagnostic imaging of other specified body structures: Secondary | ICD-10-CM

## 2014-05-12 DIAGNOSIS — R918 Other nonspecific abnormal finding of lung field: Secondary | ICD-10-CM | POA: Diagnosis not present

## 2014-05-12 DIAGNOSIS — J984 Other disorders of lung: Secondary | ICD-10-CM | POA: Diagnosis not present

## 2014-06-26 ENCOUNTER — Telehealth: Payer: Self-pay

## 2014-06-26 NOTE — Telephone Encounter (Signed)
Called patient to inquire about his flu vaccine, and he states that he had the shot in October.

## 2014-09-29 LAB — HM DIABETES EYE EXAM

## 2014-11-08 ENCOUNTER — Other Ambulatory Visit: Payer: Self-pay | Admitting: Emergency Medicine

## 2014-11-10 ENCOUNTER — Other Ambulatory Visit: Payer: Self-pay | Admitting: Physician Assistant

## 2014-11-18 ENCOUNTER — Other Ambulatory Visit: Payer: Self-pay | Admitting: Emergency Medicine

## 2015-04-05 ENCOUNTER — Other Ambulatory Visit: Payer: Self-pay | Admitting: Physician Assistant

## 2015-04-05 ENCOUNTER — Ambulatory Visit (INDEPENDENT_AMBULATORY_CARE_PROVIDER_SITE_OTHER): Payer: Medicare Other | Admitting: Physician Assistant

## 2015-04-05 VITALS — BP 124/62 | HR 97 | Temp 98.3°F | Resp 16 | Ht 73.0 in | Wt 240.0 lb

## 2015-04-05 DIAGNOSIS — E7800 Pure hypercholesterolemia, unspecified: Secondary | ICD-10-CM

## 2015-04-05 DIAGNOSIS — M25569 Pain in unspecified knee: Secondary | ICD-10-CM

## 2015-04-05 DIAGNOSIS — L989 Disorder of the skin and subcutaneous tissue, unspecified: Secondary | ICD-10-CM | POA: Diagnosis not present

## 2015-04-05 DIAGNOSIS — Z79899 Other long term (current) drug therapy: Secondary | ICD-10-CM

## 2015-04-05 DIAGNOSIS — E119 Type 2 diabetes mellitus without complications: Secondary | ICD-10-CM | POA: Diagnosis not present

## 2015-04-05 DIAGNOSIS — E785 Hyperlipidemia, unspecified: Secondary | ICD-10-CM | POA: Diagnosis not present

## 2015-04-05 DIAGNOSIS — I10 Essential (primary) hypertension: Secondary | ICD-10-CM | POA: Diagnosis not present

## 2015-04-05 LAB — COMPLETE METABOLIC PANEL WITH GFR
ALT: 73 U/L — AB (ref 9–46)
AST: 55 U/L — ABNORMAL HIGH (ref 10–35)
Albumin: 4.1 g/dL (ref 3.6–5.1)
Alkaline Phosphatase: 74 U/L (ref 40–115)
BUN: 13 mg/dL (ref 7–25)
CALCIUM: 9.9 mg/dL (ref 8.6–10.3)
CHLORIDE: 101 mmol/L (ref 98–110)
CO2: 25 mmol/L (ref 20–31)
Creat: 0.75 mg/dL (ref 0.70–1.18)
Glucose, Bld: 134 mg/dL — ABNORMAL HIGH (ref 65–99)
POTASSIUM: 4.4 mmol/L (ref 3.5–5.3)
Sodium: 134 mmol/L — ABNORMAL LOW (ref 135–146)
Total Bilirubin: 0.6 mg/dL (ref 0.2–1.2)
Total Protein: 6.4 g/dL (ref 6.1–8.1)

## 2015-04-05 LAB — POCT CBC
GRANULOCYTE PERCENT: 67 % (ref 37–80)
HCT, POC: 49.2 % (ref 43.5–53.7)
Hemoglobin: 15.7 g/dL (ref 14.1–18.1)
Lymph, poc: 2.8 (ref 0.6–3.4)
MCH, POC: 29.4 pg (ref 27–31.2)
MCHC: 31.9 g/dL (ref 31.8–35.4)
MCV: 92.3 fL (ref 80–97)
MID (CBC): 0.7 (ref 0–0.9)
MPV: 6.2 fL (ref 0–99.8)
POC GRANULOCYTE: 7.1 — AB (ref 2–6.9)
POC LYMPH PERCENT: 26.7 %L (ref 10–50)
POC MID %: 6.3 % (ref 0–12)
Platelet Count, POC: 482 10*3/uL — AB (ref 142–424)
RBC: 5.33 M/uL (ref 4.69–6.13)
RDW, POC: 13.7 %
WBC: 10.6 10*3/uL — AB (ref 4.6–10.2)

## 2015-04-05 LAB — LIPID PANEL
CHOLESTEROL: 215 mg/dL — AB (ref 125–200)
HDL: 46 mg/dL (ref >=40)
LDL Cholesterol: 139 mg/dL — ABNORMAL HIGH (ref <130)
TRIGLYCERIDES: 148 mg/dL (ref <150)
Total CHOL/HDL Ratio: 4.7 Ratio (ref <=5.0)
VLDL: 30 mg/dL (ref <30)

## 2015-04-05 MED ORDER — MELOXICAM 15 MG PO TABS: 15.0000 mg | ORAL_TABLET | Freq: Every day | ORAL | Status: DC | PRN

## 2015-04-05 MED ORDER — PRAVASTATIN SODIUM 40 MG PO TABS: 40.0000 mg | ORAL_TABLET | Freq: Every day | ORAL | Status: DC

## 2015-04-05 MED ORDER — METFORMIN HCL 1000 MG PO TABS: ORAL_TABLET | ORAL | Status: DC

## 2015-04-05 NOTE — Progress Notes (Addendum)
[facility] [address] [place] [address] [phone]  Date:  04/05/2015   Name:  Christopher Bauer   DOB:  1945-07-23   MRN:  [id number]  PCP:  Kennon Portela, MD   Chief Complaint  Patient presents with  . Follow-up    Diabetes  . Medication Refill  . Rash    Bilateral Arms and face     History of Present Illness:  Christopher Bauer is a 69 y.o. male patient who presents to [facility] for chief complaint of rash of arms and right side of face, follow up of diabetes, and medication refill. Patient reports that his diabetes has been within normal range.  He denies any changes in vision, polyuria, fatigue, dizziness, or nausea.  He has not been exercising as of late due to schedule.  He has not had great dietary restriction. Medication refill of metformin and pravastatin.    Right side of face with open wound that has not healed for several years.  Same location with biopsy for basal cell carcinoma.  After the biopsy, the wound appeared like it had never wanted to close.  It is not painful unless he nicks it with his razor.  He has not followed up with the dermatologist.   He also complains of scars and open wounds along his arms.  They heel slowly into hypopigmented skin.  He has minimal sun exposure.    He also noticed change in mood.  He feels as if he is shorter fused.  He wakes in the morning and may feel agitated initially until some time passes.  He denies stressors, loss of interest, energy, sleep, appetite.  No HI.  He works at a [employer].  He has not had any altercations and does not feel these thoughts.    Patient Active Problem List   Diagnosis Date Noted  . Abnormal CXR 05/01/2014  . Diabetes mellitus type II 04/13/2012  . High cholesterol 04/13/2012  . Osteoarthritis of both knees 04/13/2012  . Overweight(278.02) 04/13/2012    Past Medical History  Diagnosis Date  . Arthritis   . Diabetes mellitus without complication   . Hypertension   .  Hyperlipidemia     Past Surgical History  Procedure Laterality Date  . Appendectomy      Social History  Substance Use Topics  . Smoking status: Current Some Day Smoker -- 0.25 packs/day for 50 years    Types: Cigarettes  . Smokeless tobacco: None  . Alcohol Use: 0.0 oz/week    0 Standard drinks or equivalent per week    Family History  Problem Relation Age of Onset  . Heart disease Mother     No Known Allergies  Medication list has been reviewed and updated.  Current Outpatient Prescriptions on File Prior to Visit  Medication Sig Dispense Refill  . aspirin 81 MG tablet Take 81 mg by mouth daily.    Marland Kitchen lisinopril-hydrochlorothiazide (PRINZIDE,ZESTORETIC) 20-12.5 MG per tablet Take 1 tablet by mouth daily. 90 tablet 3  . metFORMIN (GLUCOPHAGE) 1000 MG tablet TAKE 1 AND 1/2 TABLETS BY MOUTH TWICE DAILY.  "OV NEEDED FOR FURTHER REFILLS" 2ND 270 tablet 0  . pravastatin (PRAVACHOL) 40 MG tablet Take 1 tablet (40 mg total) by mouth daily. 90 tablet 3  . meloxicam (MOBIC) 15 MG tablet TAKE 1 TABLET BY MOUTH DAILY AS NEEDED FOR PAIN (Patient not taking: Reported on 04/05/2015) 30 tablet 0   No current facility-administered medications on file prior  to visit.    ROS ROS otherwise unremarkable unless listed above.   Physical Examination: BP 124/62 mmHg  Pulse 97  Temp(Src) 98.3 F (36.8 C) (Oral)  Resp 16  Ht 6\' 1"  (1.854 m)  Wt 240 lb (108.863 kg)  BMI 31.67 kg/m2  SpO2 96% Ideal Body Weight: Weight in (lb) to have BMI = 25: 189.1  Physical Exam  Constitutional: He is oriented to person, place, and time. He appears well-developed and well-nourished. No distress.  HENT:  Head: Normocephalic and atraumatic.  Eyes: Conjunctivae and EOM are normal. Pupils are equal, round, and reactive to light.  Cardiovascular: Normal rate, regular rhythm and normal heart sounds.  Exam reveals no gallop, no distant heart sounds and no friction rub.   Pulses:      Dorsalis pedis pulses are  2+ on the right side, and 2+ on the left side.  Pulmonary/Chest: Effort normal. No respiratory distress.  Musculoskeletal:       Right foot: There is normal range of motion and no deformity.       Left foot: There is normal range of motion and no deformity.  Feet:  Right Foot:  Protective Sensation: 5 sites tested.5 sites sensed. Skin Integrity: Negative for ulcer or blister.  Left Foot:  Protective Sensation: 5 sites tested. 5 sites sensed. Skin Integrity: Negative for ulcer or blister.  Neurological: He is alert and oriented to person, place, and time.  Skin: Skin is warm and dry. He is not diaphoretic.  Forearms with scarring and open abrasions consistent with excoriations.   Right preauricular with ulcerated lesion without inflammation, sharp borders.  No necrosis or erythema.    Psychiatric: He has a normal mood and affect. His behavior is normal.     Assessment and Plan: 69 year old male with PMH listed above that is here today for chief complaint of rash, medication refill, and diabetes recheck. Labs drawn today, rechecking hemoglobin a1c, and kidney function and lipids. Refill glucophage today Dermatology consult appreciated at this time.  Likely the basal cell may have returned.  Arms appear due to nightly scratching skin picking, likely while sleeping.    Type 2 diabetes mellitus without complication - Plan: POCT CBC, COMPLETE METABOLIC PANEL WITH GFR, Lipid panel, Hemoglobin A1c, pravastatin (PRAVACHOL) 40 MG tablet, metFORMIN (GLUCOPHAGE) 1000 MG tablet  Hyperlipidemia - Plan: Lipid panel, pravastatin (PRAVACHOL) 40 MG tablet  Essential hypertension - Plan: COMPLETE METABOLIC PANEL WITH GFR, Lipid panel, pravastatin (PRAVACHOL) 40 MG tablet  Skin lesion - Plan: Ambulatory referral to Dermatology  High cholesterol - Plan: pravastatin (PRAVACHOL) 40 MG tablet  Encounter for medication review - Plan: pravastatin (PRAVACHOL) 40 MG tablet  Knee joint pain, unspecified  laterality - Plan: DISCONTINUED: meloxicam (MOBIC) 15 MG tablet  Ivar Drape, PA-C [facility] 04/05/2015 8:24 AM

## 2015-04-05 NOTE — Patient Instructions (Signed)
I will contact you with the lab results as well as if I can find this medication. Please await contact regarding your appointment with dermatology. Make sure you attempt to exercise.  Water aerobics would be better for the knees.  [facility] the [facility].

## 2015-04-06 LAB — HEMOGLOBIN A1C
Hgb A1c MFr Bld: 7 % — ABNORMAL HIGH (ref <5.7)
Mean Plasma Glucose: 154 mg/dL — ABNORMAL HIGH (ref <117)

## 2015-04-16 DIAGNOSIS — C44319 Basal cell carcinoma of skin of other parts of face: Secondary | ICD-10-CM | POA: Diagnosis not present

## 2015-04-16 DIAGNOSIS — Z85828 Personal history of other malignant neoplasm of skin: Secondary | ICD-10-CM | POA: Diagnosis not present

## 2015-04-16 DIAGNOSIS — L281 Prurigo nodularis: Secondary | ICD-10-CM | POA: Diagnosis not present

## 2015-04-29 DIAGNOSIS — C44319 Basal cell carcinoma of skin of other parts of face: Secondary | ICD-10-CM | POA: Diagnosis not present

## 2015-04-29 DIAGNOSIS — Z85828 Personal history of other malignant neoplasm of skin: Secondary | ICD-10-CM | POA: Diagnosis not present

## 2015-05-07 DIAGNOSIS — Z4802 Encounter for removal of sutures: Secondary | ICD-10-CM | POA: Diagnosis not present

## 2015-05-08 ENCOUNTER — Encounter: Payer: Self-pay | Admitting: Family Medicine

## 2015-05-08 ENCOUNTER — Other Ambulatory Visit: Payer: Self-pay | Admitting: Emergency Medicine

## 2015-05-08 ENCOUNTER — Telehealth: Payer: Self-pay | Admitting: Family Medicine

## 2015-07-15 ENCOUNTER — Other Ambulatory Visit: Payer: Self-pay | Admitting: Physician Assistant

## 2015-07-15 ENCOUNTER — Telehealth: Payer: Self-pay

## 2015-07-15 DIAGNOSIS — E119 Type 2 diabetes mellitus without complications: Secondary | ICD-10-CM

## 2015-07-15 NOTE — Telephone Encounter (Signed)
Notes Recorded by Joretta Bachelor, PA on 04/29/2015 at 8:45 AM It was a pleasure to meet you. Your a1c has increased to 7.0. We need to exercise 4 times per week for at least 30 minutes of aerobic movement. This will help with weight loss which will help your glucose and cholesterol. This is also almost guaranteed to help with mood as well. Cholesterol has also mildly increased, and again exercise will help that. Avoid artifical juices and sugars. Avoid fried fatty foods and sweet teas. We can recheck this in 3 months. If you have concerns, let me know.       Ref Range       He was supposed to come back in three months.

## 2015-07-15 NOTE — Telephone Encounter (Signed)
Pt would like a refill on his metFORMIN (GLUCOPHAGE) 1000 MG tablet [id number]. Pharmacy:  [id number] - [place], [place] - [address] AT [address] CB # [phone]

## 2015-07-16 MED ORDER — METFORMIN HCL 1000 MG PO TABS: ORAL_TABLET | ORAL | Status: DC

## 2015-07-16 NOTE — Telephone Encounter (Signed)
Rx refilled.

## 2015-08-07 ENCOUNTER — Other Ambulatory Visit: Payer: Self-pay | Admitting: Physician Assistant

## 2015-09-30 ENCOUNTER — Telehealth: Payer: Self-pay | Admitting: Family Medicine

## 2015-09-30 NOTE — Telephone Encounter (Signed)
Patient returned call.  We updated his flu information.   He will not have another colonoscopy, he is not due until November 2017.

## 2015-09-30 NOTE — Telephone Encounter (Signed)
PATIENT WILL BE RETURNING CALL TO SEE IF HE HAS A COLONOSCOPY SINCE 2007.  IF HE HAS WHERE AND WHEN?  IF HE HAS NOT, SCHEDULE ONE WITH  [facility]

## 2015-10-10 ENCOUNTER — Other Ambulatory Visit: Payer: Self-pay | Admitting: Physician Assistant

## 2015-10-15 ENCOUNTER — Ambulatory Visit (INDEPENDENT_AMBULATORY_CARE_PROVIDER_SITE_OTHER): Payer: Medicare HMO

## 2015-10-15 ENCOUNTER — Encounter: Payer: Self-pay | Admitting: Emergency Medicine

## 2015-10-15 ENCOUNTER — Ambulatory Visit (INDEPENDENT_AMBULATORY_CARE_PROVIDER_SITE_OTHER): Payer: Medicare HMO | Admitting: Emergency Medicine

## 2015-10-15 VITALS — BP 120/58 | HR 102 | Temp 98.2°F | Resp 16 | Ht 72.0 in | Wt 231.4 lb

## 2015-10-15 DIAGNOSIS — F17200 Nicotine dependence, unspecified, uncomplicated: Secondary | ICD-10-CM

## 2015-10-15 DIAGNOSIS — C4490 Unspecified malignant neoplasm of skin, unspecified: Secondary | ICD-10-CM | POA: Diagnosis not present

## 2015-10-15 DIAGNOSIS — Z125 Encounter for screening for malignant neoplasm of prostate: Secondary | ICD-10-CM | POA: Diagnosis not present

## 2015-10-15 DIAGNOSIS — E785 Hyperlipidemia, unspecified: Secondary | ICD-10-CM | POA: Diagnosis not present

## 2015-10-15 DIAGNOSIS — E119 Type 2 diabetes mellitus without complications: Secondary | ICD-10-CM | POA: Diagnosis not present

## 2015-10-15 DIAGNOSIS — R945 Abnormal results of liver function studies: Secondary | ICD-10-CM

## 2015-10-15 DIAGNOSIS — Z720 Tobacco use: Secondary | ICD-10-CM | POA: Diagnosis not present

## 2015-10-15 DIAGNOSIS — E7800 Pure hypercholesterolemia, unspecified: Secondary | ICD-10-CM | POA: Diagnosis not present

## 2015-10-15 DIAGNOSIS — I10 Essential (primary) hypertension: Secondary | ICD-10-CM | POA: Diagnosis not present

## 2015-10-15 DIAGNOSIS — R799 Abnormal finding of blood chemistry, unspecified: Secondary | ICD-10-CM | POA: Diagnosis not present

## 2015-10-15 DIAGNOSIS — R7989 Other specified abnormal findings of blood chemistry: Secondary | ICD-10-CM | POA: Insufficient documentation

## 2015-10-15 DIAGNOSIS — Z79899 Other long term (current) drug therapy: Secondary | ICD-10-CM

## 2015-10-15 DIAGNOSIS — R918 Other nonspecific abnormal finding of lung field: Secondary | ICD-10-CM

## 2015-10-15 LAB — POC MICROSCOPIC URINALYSIS (UMFC): Mucus: ABSENT

## 2015-10-15 LAB — POCT URINALYSIS DIP (MANUAL ENTRY)
BILIRUBIN UA: NEGATIVE
Bilirubin, UA: NEGATIVE
Glucose, UA: NEGATIVE
LEUKOCYTES UA: NEGATIVE
NITRITE UA: NEGATIVE
Protein Ur, POC: NEGATIVE
SPEC GRAV UA: 1.025
UROBILINOGEN UA: 1
pH, UA: 5.5

## 2015-10-15 LAB — GLUCOSE, POCT (MANUAL RESULT ENTRY): POC Glucose: 102 mg/dL — AB (ref 70–99)

## 2015-10-15 LAB — COMPLETE METABOLIC PANEL WITH GFR
ALT: 29 U/L (ref 9–46)
AST: 20 U/L (ref 10–35)
Albumin: 3.9 g/dL (ref 3.6–5.1)
Alkaline Phosphatase: 73 U/L (ref 40–115)
BUN: 16 mg/dL (ref 7–25)
CHLORIDE: 102 mmol/L (ref 98–110)
CO2: 21 mmol/L (ref 20–31)
CREATININE: 0.71 mg/dL (ref 0.70–1.18)
Calcium: 9.4 mg/dL (ref 8.6–10.3)
GFR, Est African American: >89 mL/min (ref >=60)
GFR, Est Non African American: >89 mL/min (ref >=60)
Glucose, Bld: 128 mg/dL — ABNORMAL HIGH (ref 65–99)
Potassium: 4.7 mmol/L (ref 3.5–5.3)
SODIUM: 136 mmol/L (ref 135–146)
Total Bilirubin: 0.8 mg/dL (ref 0.2–1.2)
Total Protein: 6.4 g/dL (ref 6.1–8.1)

## 2015-10-15 LAB — LIPID PANEL
Cholesterol: 191 mg/dL (ref 125–200)
HDL: 39 mg/dL — AB (ref >=40)
LDL Cholesterol: 123 mg/dL (ref <130)
Total CHOL/HDL Ratio: 4.9 Ratio (ref <=5.0)
Triglycerides: 144 mg/dL (ref <150)
VLDL: 29 mg/dL (ref <30)

## 2015-10-15 LAB — POCT GLYCOSYLATED HEMOGLOBIN (HGB A1C): HEMOGLOBIN A1C: 6.1

## 2015-10-15 LAB — HEPATITIS C ANTIBODY: HCV Ab: NEGATIVE

## 2015-10-15 MED ORDER — PRAVASTATIN SODIUM 40 MG PO TABS: 40.0000 mg | ORAL_TABLET | Freq: Every day | ORAL | Status: DC

## 2015-10-15 MED ORDER — MELOXICAM 15 MG PO TABS: ORAL_TABLET | ORAL | Status: DC

## 2015-10-15 MED ORDER — LISINOPRIL-HYDROCHLOROTHIAZIDE 20-12.5 MG PO TABS: 1.0000 | ORAL_TABLET | Freq: Every day | ORAL | Status: DC

## 2015-10-15 MED ORDER — METFORMIN HCL 1000 MG PO TABS: ORAL_TABLET | ORAL | Status: DC

## 2015-10-15 NOTE — Progress Notes (Addendum)
Patient ID: Christopher Bauer, male   DOB: October 28, 1945, 70 y.o.   MRN: [id number]    By signing my name below, I, Essence Howell, attest that this documentation has been prepared under the direction and in the presence of Darlyne Russian, MD Electronically Signed: Ladene Artist, ED Scribe 10/15/2015 at 2:56 PM.  Chief Complaint:  Chief Complaint  Patient presents with  . Follow-up    DIABETES  . Medication Refill    METFORMIN,PRAVASTATIN, MOBIC, LISINOPRIL-HCTZ for 90 day   HPI: Christopher Bauer is a 70 y.o. male, with a h/o HTN, DM, hyperlipidemia, who reports to [facility] today for a follow-up regarding DM. Pt states that he feels well overall. Pt states that he has been eating healthier and losing weight to improve his A1C.   Wt Readings from Last 3 Encounters:  10/15/15 231 lb 6.4 oz (104.962 kg)  04/05/15 240 lb (108.863 kg)  05/01/14 237 lb (107.502 kg)   R Wrist Mas  Pt reports a non-tender mass to his right wrist for several weeks. He suspects that this is a ganglion cyst. Pt has a h/o ganglion cyst to left wrist and right foot that were removed several years ago.   Dermatology  Pt had a basal cell removed from the right side of his face in October 2016 by Dr. Elvera Lennox with [facility]. He plans to schedule another appointment with them for another skin spot that he recently noticed. Pt is only available on Thursdays. He states that this skin spot feels like a bee sting.   Smoker Pt is an active smoker; smoke 0.5 ppd. He denies chest pain at rest or on exertion. Pt takes a baby aspirin daily. He had a stress test done several years ago.   Preventative Maintenance  Pt is overdue for his annual eye exam. Pt's last colonoscopy was in October 2007; pt states that he does not desire to have another due to infection following his last colonoscopy.   Immunizations Pt received Prevnar 13 in 04/2014. He has not yet received the pneumovax vaccine.   Medication Refill  Pt requests a refill  of Metformin, pravastatin, mobic and lisipopril-HCTZ for 90 days.    Past Medical History  Diagnosis Date  . Arthritis   . Diabetes mellitus without complication   . Hypertension   . Hyperlipidemia    Past Surgical History  Procedure Laterality Date  . Appendectomy     Social History   Social History  . Marital Status: Single    Spouse Name: N/A  . Number of Children: N/A  . Years of Education: N/A   Social History Main Topics  . Smoking status: Current Some Day Smoker -- 0.25 packs/day for 50 years    Types: Cigarettes  . Smokeless tobacco: Not on file  . Alcohol Use: 0.0 oz/week    0 Standard drinks or equivalent per week  . Drug Use: No  . Sexual Activity: Not on file   Other Topics Concern  . Not on file   Social History Narrative   Family History  Problem Relation Age of Onset  . Heart disease Mother    No Known Allergies Prior to Admission medications   Medication Sig Start Date End Date Taking? Authorizing Provider  aspirin 81 MG tablet Take 81 mg by mouth daily.    Historical Provider, MD  lisinopril-hydrochlorothiazide (PRINZIDE,ZESTORETIC) 20-12.5 MG tablet TAKE 1 TABLET BY MOUTH EVERY DAY 05/12/15   Dorian Heckle English, PA  meloxicam (MOBIC) 15 MG tablet TAKE  1 TABLET(15 MG) BY MOUTH DAILY AS NEEDED FOR PAIN 04/06/15   Dorian Heckle English, PA  metFORMIN (GLUCOPHAGE) 1000 MG tablet TAKE 1 AND 1/2 TABLETS BY MOUTH TWICE DAILY( FOLLOW UP VISIT REQUIRED FOR MORE REFILLS) 10/11/15   Darlyne Russian, MD  pravastatin (PRAVACHOL) 40 MG tablet Take 1 tablet (40 mg total) by mouth daily. 04/05/15   Dorian Heckle English, PA   ROS: The patient denies fevers, chills, night sweats, unintentional weight loss, -chest pain, palpitations, wheezing, dyspnea on exertion, nausea, vomiting, abdominal pain, dysuria, hematuria, melena, numbness, weakness, or tingling. +cyst (R wrist)  All other systems have been reviewed and were otherwise negative with the exception of those mentioned  in the HPI and as above.    PHYSICAL EXAM: Filed Vitals:   10/15/15 1421  BP: 120/58  Pulse: 102  Temp: 98.2 F (36.8 C)  Resp: 16   Body mass index is 31.38 kg/(m^2).  General: Alert, no acute distress HEENT:  Normocephalic, atraumatic, oropharynx patent. R side of face: 1x1.5 cm crusty raised area suspicious for basal sub cancer.  Eye: Juliet[facility] M[facility] Cardiovascular: Tachycardic. No rubs, murmurs or gallops. No Carotid bruits, radial pulse intact. No pedal edema.  Respiratory: Clear to auscultation bilaterally. No wheezes, rales, or rhonchi. No cyanosis, no use of accessory musculature Abdominal: No organomegaly, abdomen is soft and non-tender, positive bowel sounds. No masses. Musculoskeletal: Gait intact. No edema, tenderness. R arm: 2x2 cm ganglion cyst. Skin: No rashes. Neurologic: Facial musculature symmetric. Psychiatric: Patient acts appropriately throughout our interaction. Lymphatic: No cervical or submandibular lymphadenopathy  LABS: Results for orders placed or performed in visit on 10/15/15  POCT glucose (manual entry)  Result Value Ref Range   POC Glucose 102 (A) 70 - 99 mg/dl  POCT glycosylated hemoglobin (Hb A1C)  Result Value Ref Range   Hemoglobin A1C 6.1   POCT urinalysis dipstick  Result Value Ref Range   Color, UA yellow yellow   Clarity, UA clear clear   Glucose, UA negative negative   Bilirubin, UA negative negative   Ketones, POC UA negative negative   Spec Grav, UA 1.025    Blood, UA trace-intact (A) negative   pH, UA 5.5    Protein Ur, POC negative negative   Urobilinogen, UA 1.0    Nitrite, UA Negative Negative   Leukocytes, UA Negative Negative  POCT Microscopic Urinalysis ([facility])  Result Value Ref Range   WBC,UR,HPF,POC None None WBC/hpf   RBC,UR,HPF,POC None None RBC/hpf   Bacteria None None, Too numerous to count   Mucus Absent Absent   Epithelial Cells, UR Per Microscopy Few (A) None, Too numerous to count cells/hpf     EKG/XRAY:     ASSESSMENT/PLAN: Referral made to cardiology for evaluation due to multiple risk factors. Patient advised to make an appointment at the dermatology office for appointment for  a skin cancer. Patient scheduled for CT chest follow-up pulmonary nodules. Patient agrees, back in 2 months to update his immunizations and discuss ways he can stop smoking.I personally performed the services described in this documentation, which was scribed in my presence. The recorded information has been reviewed and is accurate.    Gross sideeffects, risk and benefits, and alternatives of medications d/w patient. Patient is aware that all medications have potential sideeffects and we are unable to predict every sideeffect or drug-drug interaction that may occur.  Arlyss Queen MD 10/15/2015 2:35 PM

## 2015-10-15 NOTE — Patient Instructions (Addendum)
I decreased her metformin to 1 g twice a day. I have made a referral for you to see the cardiologist. I have made a referral for you to have a CT of your chest to follow-up on your smoking history.Smoking Cessation, Tips for Success If you are ready to quit smoking, congratulations! You have chosen to help yourself be healthier. Cigarettes bring nicotine, tar, carbon monoxide, and other irritants into your body. Your lungs, heart, and blood vessels will be able to work better without these poisons. There are many different ways to quit smoking. Nicotine gum, nicotine patches, a nicotine inhaler, or nicotine nasal spray can help with physical craving. Hypnosis, support groups, and medicines help break the habit of smoking. WHAT THINGS CAN I DO TO MAKE QUITTING EASIER?  Here are some tips to help you quit for good:  Pick a date when you will quit smoking completely. Tell all of your friends and family about your plan to quit on that date.  Do not try to slowly cut down on the number of cigarettes you are smoking. Pick a quit date and quit smoking completely starting on that day.  Throw away all cigarettes.   Clean and remove all ashtrays from your home, work, and car.  On a card, write down your reasons for quitting. Carry the card with you and read it when you get the urge to smoke.  Cleanse your body of nicotine. Drink enough water and fluids to keep your urine clear or pale yellow. Do this after quitting to flush the nicotine from your body.  Learn to predict your moods. Do not let a bad situation be your excuse to have a cigarette. Some situations in your life might tempt you into wanting a cigarette.  Never have "just one" cigarette. It leads to wanting another and another. Remind yourself of your decision to quit.  Change habits associated with smoking. If you smoked while driving or when feeling stressed, try other activities to replace smoking. Stand up when drinking your coffee. Brush  your teeth after eating. Sit in a different chair when you read the paper. Avoid alcohol while trying to quit, and try to drink fewer caffeinated beverages. Alcohol and caffeine may urge you to smoke.  Avoid foods and drinks that can trigger a desire to smoke, such as sugary or spicy foods and alcohol.  Ask people who smoke not to smoke around you.  Have something planned to do right after eating or having a cup of coffee. For example, plan to take a walk or exercise.  Try a relaxation exercise to calm you down and decrease your stress. Remember, you may be tense and nervous for the first 2 weeks after you quit, but this will pass.  Find new activities to keep your hands busy. Play with a pen, coin, or rubber band. Doodle or draw things on paper.  Brush your teeth right after eating. This will help cut down on the craving for the taste of tobacco after meals. You can also try mouthwash.   Use oral substitutes in place of cigarettes. Try using lemon drops, carrots, cinnamon sticks, or chewing gum. Keep them handy so they are available when you have the urge to smoke.  When you have the urge to smoke, try deep breathing.  Designate your home as a nonsmoking area.  If you are a heavy smoker, ask your health care provider about a prescription for nicotine chewing gum. It can ease your withdrawal from nicotine.  Reward  yourself. Set aside the cigarette money you save and buy yourself something nice.  Look for support from others. Join a support group or smoking cessation program. Ask someone at home or at work to help you with your plan to quit smoking.  Always ask yourself, "Do I need this cigarette or is this just a reflex?" Tell yourself, "Today, I choose not to smoke," or "I do not want to smoke." You are reminding yourself of your decision to quit.  Do not replace cigarette smoking with electronic cigarettes (commonly called e-cigarettes). The safety of e-cigarettes is unknown, and some  may contain harmful chemicals.  If you relapse, do not give up! Plan ahead and think about what you will do the next time you get the urge to smoke. HOW WILL I FEEL WHEN I QUIT SMOKING? You may have symptoms of withdrawal because your body is used to nicotine (the addictive substance in cigarettes). You may crave cigarettes, be irritable, feel very hungry, cough often, get headaches, or have difficulty concentrating. The withdrawal symptoms are only temporary. They are strongest when you first quit but will go away within 10-14 days. When withdrawal symptoms occur, stay in control. Think about your reasons for quitting. Remind yourself that these are signs that your body is healing and getting used to being without cigarettes. Remember that withdrawal symptoms are easier to treat than the major diseases that smoking can cause.  Even after the withdrawal is over, expect periodic urges to smoke. However, these cravings are generally short lived and will go away whether you smoke or not. Do not smoke! WHAT RESOURCES ARE AVAILABLE TO HELP ME QUIT SMOKING? Your health care provider can direct you to community resources or hospitals for support, which may include:  Group support.  Education.  Hypnosis.  Therapy.   This information is not intended to replace advice given to you by your health care provider. Make sure you discuss any questions you have with your health care provider.   Document Released: 03/25/2004 Document Revised: 07/18/2014 Document Reviewed: 12/13/2012 Elsevier Interactive Patient Education 2016 [employer].     IF you received an x-ray today, you will receive an invoice from [facility]. Please contact [facility] at [phone] with questions or concerns regarding your invoice.   IF you received labwork today, you will receive an invoice from [employer]. Please contact [facility] at [phone] with questions or concerns  regarding your invoice.   Our billing staff will not be able to assist you with questions regarding bills from these companies.  You will be contacted with the lab results as soon as they are available. The fastest way to get your results is to activate your My Chart account. Instructions are located on the last page of this paperwork. If you have not heard from [place] regarding the results in 2 weeks, please contact this office.

## 2015-10-16 LAB — PSA, MEDICARE: PSA: 0.77 ng/mL (ref <=4.00)

## 2015-10-16 LAB — MICROALBUMIN, URINE: Microalb, Ur: 0.3 mg/dL

## 2015-10-22 DIAGNOSIS — L821 Other seborrheic keratosis: Secondary | ICD-10-CM | POA: Diagnosis not present

## 2015-10-22 DIAGNOSIS — Z85828 Personal history of other malignant neoplasm of skin: Secondary | ICD-10-CM | POA: Diagnosis not present

## 2015-10-22 DIAGNOSIS — C44319 Basal cell carcinoma of skin of other parts of face: Secondary | ICD-10-CM | POA: Diagnosis not present

## 2015-11-26 ENCOUNTER — Ambulatory Visit
Admission: RE | Admit: 2015-11-26 | Discharge: 2015-11-26 | Disposition: A | Discharge status: Home / Self Care | Payer: Medicare HMO | Source: Ambulatory Visit | Attending: Emergency Medicine | Admitting: Emergency Medicine

## 2015-11-26 ENCOUNTER — Other Ambulatory Visit: Payer: Medicare Other

## 2015-11-26 ENCOUNTER — Other Ambulatory Visit: Payer: Self-pay | Admitting: Emergency Medicine

## 2015-11-26 DIAGNOSIS — R918 Other nonspecific abnormal finding of lung field: Secondary | ICD-10-CM | POA: Diagnosis not present

## 2015-11-26 DIAGNOSIS — I2584 Coronary atherosclerosis due to calcified coronary lesion: Principal | ICD-10-CM

## 2015-11-26 DIAGNOSIS — I2510 Atherosclerotic heart disease of native coronary artery without angina pectoris: Secondary | ICD-10-CM

## 2015-12-03 DIAGNOSIS — Z85828 Personal history of other malignant neoplasm of skin: Secondary | ICD-10-CM | POA: Diagnosis not present

## 2015-12-03 DIAGNOSIS — C44319 Basal cell carcinoma of skin of other parts of face: Secondary | ICD-10-CM | POA: Diagnosis not present

## 2015-12-24 ENCOUNTER — Encounter: Payer: Self-pay | Admitting: *Deleted

## 2016-02-04 ENCOUNTER — Ambulatory Visit (INDEPENDENT_AMBULATORY_CARE_PROVIDER_SITE_OTHER): Payer: Medicare HMO

## 2016-02-04 ENCOUNTER — Ambulatory Visit (INDEPENDENT_AMBULATORY_CARE_PROVIDER_SITE_OTHER): Payer: Medicare HMO | Admitting: Emergency Medicine

## 2016-02-04 ENCOUNTER — Encounter: Payer: Self-pay | Admitting: Emergency Medicine

## 2016-02-04 VITALS — BP 110/74 | HR 112 | Temp 97.8°F | Resp 18 | Ht 72.0 in | Wt 235.0 lb

## 2016-02-04 DIAGNOSIS — I2584 Coronary atherosclerosis due to calcified coronary lesion: Secondary | ICD-10-CM

## 2016-02-04 DIAGNOSIS — Z23 Encounter for immunization: Secondary | ICD-10-CM

## 2016-02-04 DIAGNOSIS — H547 Unspecified visual loss: Secondary | ICD-10-CM

## 2016-02-04 DIAGNOSIS — Z720 Tobacco use: Secondary | ICD-10-CM

## 2016-02-04 DIAGNOSIS — I10 Essential (primary) hypertension: Secondary | ICD-10-CM

## 2016-02-04 DIAGNOSIS — M7989 Other specified soft tissue disorders: Secondary | ICD-10-CM | POA: Diagnosis not present

## 2016-02-04 DIAGNOSIS — R938 Abnormal findings on diagnostic imaging of other specified body structures: Secondary | ICD-10-CM

## 2016-02-04 DIAGNOSIS — F17200 Nicotine dependence, unspecified, uncomplicated: Secondary | ICD-10-CM

## 2016-02-04 DIAGNOSIS — E119 Type 2 diabetes mellitus without complications: Secondary | ICD-10-CM | POA: Diagnosis not present

## 2016-02-04 DIAGNOSIS — R000 Tachycardia, unspecified: Secondary | ICD-10-CM

## 2016-02-04 DIAGNOSIS — M67431 Ganglion, right wrist: Secondary | ICD-10-CM

## 2016-02-04 DIAGNOSIS — M170 Bilateral primary osteoarthritis of knee: Secondary | ICD-10-CM | POA: Diagnosis not present

## 2016-02-04 DIAGNOSIS — E7800 Pure hypercholesterolemia, unspecified: Secondary | ICD-10-CM | POA: Diagnosis not present

## 2016-02-04 DIAGNOSIS — R9389 Abnormal findings on diagnostic imaging of other specified body structures: Secondary | ICD-10-CM

## 2016-02-04 DIAGNOSIS — I2510 Atherosclerotic heart disease of native coronary artery without angina pectoris: Secondary | ICD-10-CM | POA: Diagnosis not present

## 2016-02-04 LAB — COMPLETE METABOLIC PANEL WITH GFR
ALBUMIN: 4.1 g/dL (ref 3.6–5.1)
ALK PHOS: 75 U/L (ref 40–115)
ALT: 41 U/L (ref 9–46)
AST: 31 U/L (ref 10–35)
BILIRUBIN TOTAL: 0.7 mg/dL (ref 0.2–1.2)
BUN: 16 mg/dL (ref 7–25)
CO2: 21 mmol/L (ref 20–31)
CREATININE: 0.83 mg/dL (ref 0.70–1.18)
Calcium: 9.7 mg/dL (ref 8.6–10.3)
Chloride: 102 mmol/L (ref 98–110)
GFR, EST NON AFRICAN AMERICAN: 89 mL/min (ref >=60)
GFR, Est African American: >89 mL/min (ref >=60)
GLUCOSE: 180 mg/dL — AB (ref 65–99)
Potassium: 4.3 mmol/L (ref 3.5–5.3)
SODIUM: 134 mmol/L — AB (ref 135–146)
TOTAL PROTEIN: 6.6 g/dL (ref 6.1–8.1)

## 2016-02-04 LAB — POCT GLYCOSYLATED HEMOGLOBIN (HGB A1C): Hemoglobin A1C: 7.1

## 2016-02-04 LAB — GLUCOSE, POCT (MANUAL RESULT ENTRY): POC Glucose: 179 mg/dl — AB (ref 70–99)

## 2016-02-04 MED ORDER — DICLOFENAC SODIUM 1 % TD GEL: 4.0000 g | Freq: Four times a day (QID) | TRANSDERMAL | 3 refills | Status: DC

## 2016-02-04 NOTE — Patient Instructions (Signed)
     IF you received an x-ray today, you will receive an invoice from [facility]. Please contact [facility] at [phone] with questions or concerns regarding your invoice.   IF you received labwork today, you will receive an invoice from [facility]. Please contact [facility] at [phone] with questions or concerns regarding your invoice.   Our billing staff will not be able to assist you with questions regarding bills from these companies.  You will be contacted with the lab results as soon as they are available. The fastest way to get your results is to activate your My Chart account. Instructions are located on the last page of this paperwork. If you have not heard from us regarding the results in 2 weeks, please contact this office.      

## 2016-02-04 NOTE — Progress Notes (Signed)
By signing my name below, I, Moises Blood, attest that this documentation has been prepared under the direction and in the presence of Arlyss Queen, MD. Electronically Signed: Moises Blood, [place]. 02/04/2016 , 10:23 AM .  Patient was seen in room 2 .  Chief Complaint:  Chief Complaint  Patient presents with  . Follow-up    TRANSFER CARE/ MEDICATION QUESTIONS    HPI: Christopher Bauer is a 70 y.o. male who reports to [facility] today for follow up of the 2x2 cm ganglion cyst over his right wrist. Patient states the mass went away without doing anything different. He had a ganglion cyst over his left wrist surgically removed years ago.   Patient also requests referral for eye doctor. He was informed insurance would pay for it if a referral is written. He denies any vision changes.   Patient had a CT chest done on 11/26/15, which showed stable multiple opacities. Recommended follow up CT in 6-12 months. Patient has an appointment with cardiology set up per phone message.   He receives his annual flu shot for free at [employer].   Patient refuses further colonoscopies because he had an infection after having one done previously.   Past Medical History:  Diagnosis Date  . Arthritis   . Diabetes mellitus without complication ([place])   . Hyperlipidemia   . Hypertension    Past Surgical History:  Procedure Laterality Date  . APPENDECTOMY     Social History   Social History  . Marital status: Single    Spouse name: N/A  . Number of children: N/A  . Years of education: N/A   Social History Main Topics  . Smoking status: Current Some Day Smoker    Packs/day: 0.25    Years: 50.00    Types: Cigarettes  . Smokeless tobacco: Never Used  . Alcohol use 0.0 oz/week  . Drug use: No  . Sexual activity: Not Asked   Other Topics Concern  . None   Social History Narrative  . None   Family History  Problem Relation Age of Onset  . Heart disease Mother    No Known  Allergies Prior to Admission medications   Medication Sig Start Date End Date Taking? Authorizing Provider  aspirin 81 MG tablet Take 81 mg by mouth daily.   Yes Historical Provider, MD  lisinopril-hydrochlorothiazide (PRINZIDE,ZESTORETIC) 20-12.5 MG tablet Take 1 tablet by mouth daily. 10/15/15  Yes Darlyne Russian, MD  meloxicam (MOBIC) 15 MG tablet TAKE 1 TABLET(15 MG) BY MOUTH DAILY AS NEEDED FOR PAIN 10/15/15  Yes Darlyne Russian, MD  metFORMIN (GLUCOPHAGE) 1000 MG tablet Take 1 tablet by mouth daily 10/15/15  Yes Darlyne Russian, MD  pravastatin (PRAVACHOL) 40 MG tablet Take 1 tablet (40 mg total) by mouth daily. 10/15/15  Yes Darlyne Russian, MD     ROS:  Constitutional: negative for fever, chills, night sweats, weight changes, or fatigue  HEENT: negative for vision changes, hearing loss, congestion, rhinorrhea, ST, epistaxis, or sinus pressure Cardiovascular: negative for chest pain or palpitations Respiratory: negative for hemoptysis, wheezing, shortness of breath, or cough Abdominal: negative for abdominal pain, nausea, vomiting, diarrhea, or constipation Dermatological: negative for rash Neurologic: negative for headache, dizziness, or syncope All other systems reviewed and are otherwise negative with the exception to those above and in the HPI.  PHYSICAL EXAM: Vitals:   02/04/16 0959  BP: 110/74  Pulse: (!) 112  Resp: 18  Temp: 97.8 F (36.6 C)  Body mass index is 31.87 kg/m.   General: Alert, no acute distress HEENT:  Normocephalic, atraumatic, oropharynx patent; Large scar over right side of his cheek Eye: [facility] Cardiovascular:  Regular rate and rhythm, no rubs murmurs or gallops.  No Carotid bruits, radial pulse intact. No pedal edema.  Respiratory: Clear to auscultation bilaterally.  No wheezes, rales, or rhonchi.  No cyanosis, no use of accessory musculature Abdominal: No organomegaly, abdomen is soft and non-tender, positive bowel sounds. No masses. Musculoskeletal:  Gait intact. No edema, tenderness Skin: No rashes. Neurologic: Facial musculature symmetric. Psychiatric: Patient acts appropriately throughout our interaction.  Lymphatic: No cervical or submandibular lymphadenopathy Genitourinary/Anorectal: No acute findings  LABS: Results for orders placed or performed in visit on 02/04/16  POCT glucose (manual entry)  Result Value Ref Range   POC Glucose 179 (A) 70 - 99 mg/dl  POCT glycosylated hemoglobin (Hb A1C)  Result Value Ref Range   Hemoglobin A1C 7.1      EKG/XRAY:   Dg Wrist 2 Views Right  Result Date: 02/04/2016 CLINICAL DATA:  Follow-up of a previously known ganglion cyst on the wrists which apparently spontaneously regressed. Has undergone previous surgical excision of a ganglion cyst on the right. EXAM: RIGHT WRIST - 2 VIEW COMPARISON:  None in PACs FINDINGS: The bones are subjectively osteopenic. There is dense calcification in the triangular fibrocartilage. There is mild soft tissue swelling over the ulnar aspect of the wrist. There is mild degenerative joint calcification distal to the radio carpal joint. The distal radius and ulna, the carpal bones, and the visualized metacarpals are intact. Mild degenerative change of the first [facility] joint is present. IMPRESSION: There are degenerative changes centered on the radiocarpal and ulnocarpal joints. Soft tissue swelling over the ulnar aspect of the wrist is present. No discrete soft tissue mass is observed. There is no acute bony abnormality. Electronically Signed   By: David  Martinique M.D.   On: 02/04/2016 11:08  Meds ordered this encounter  Medications  . diclofenac sodium (VOLTAREN) 1 % GEL    Sig: Apply 4 g topically 4 (four) times daily.    Dispense:  100 g    Refill:  3    ASSESSMENT/PLAN: Referral made to cardiology for evaluation. He had decreased his mettformin last visit and will return to taking 1 tablet twice a day.pneumonia 23 was given todayi did place an order for his  Voltaren gel and referral made to  DrMarland Kitchen [facility]   to help with the  cystic lesion in his wrist.I personally performed the services described in this documentation, which was scribed in my presence. The recorded information has been reviewed and is accurate.he needs a follow-up CT of his chest. He also has coronary calcification needs evaluation of this.referral also made to ophthalmology.  Gross sideeffects, risk and benefits, and alternatives of medications d/w patient. Patient is aware that all medications have potential sideeffects and we are unable to predict every sideeffect or drug-drug interaction that may occur.  Arlyss Queen MD 02/04/2016 10:05 AM

## 2016-03-01 ENCOUNTER — Encounter: Payer: Self-pay | Admitting: Emergency Medicine

## 2016-03-01 DIAGNOSIS — H02831 Dermatochalasis of right upper eyelid: Secondary | ICD-10-CM | POA: Diagnosis not present

## 2016-03-01 DIAGNOSIS — H2513 Age-related nuclear cataract, bilateral: Secondary | ICD-10-CM | POA: Diagnosis not present

## 2016-03-01 DIAGNOSIS — H02834 Dermatochalasis of left upper eyelid: Secondary | ICD-10-CM | POA: Diagnosis not present

## 2016-03-01 DIAGNOSIS — E119 Type 2 diabetes mellitus without complications: Secondary | ICD-10-CM | POA: Diagnosis not present

## 2016-03-01 LAB — HM DIABETES EYE EXAM

## 2016-03-10 DIAGNOSIS — I2584 Coronary atherosclerosis due to calcified coronary lesion: Secondary | ICD-10-CM | POA: Diagnosis not present

## 2016-03-10 DIAGNOSIS — I2510 Atherosclerotic heart disease of native coronary artery without angina pectoris: Secondary | ICD-10-CM | POA: Diagnosis not present

## 2016-03-10 DIAGNOSIS — R000 Tachycardia, unspecified: Secondary | ICD-10-CM | POA: Diagnosis not present

## 2016-03-10 DIAGNOSIS — R0602 Shortness of breath: Secondary | ICD-10-CM | POA: Diagnosis not present

## 2016-03-10 DIAGNOSIS — M67431 Ganglion, right wrist: Secondary | ICD-10-CM | POA: Diagnosis not present

## 2016-03-24 ENCOUNTER — Encounter: Payer: Self-pay | Admitting: Emergency Medicine

## 2016-03-24 DIAGNOSIS — E119 Type 2 diabetes mellitus without complications: Secondary | ICD-10-CM | POA: Diagnosis not present

## 2016-03-24 DIAGNOSIS — I10 Essential (primary) hypertension: Secondary | ICD-10-CM | POA: Diagnosis not present

## 2016-03-24 DIAGNOSIS — E7800 Pure hypercholesterolemia, unspecified: Secondary | ICD-10-CM | POA: Diagnosis not present

## 2016-03-25 DIAGNOSIS — I2510 Atherosclerotic heart disease of native coronary artery without angina pectoris: Secondary | ICD-10-CM | POA: Diagnosis not present

## 2016-03-25 DIAGNOSIS — I10 Essential (primary) hypertension: Secondary | ICD-10-CM | POA: Diagnosis not present

## 2016-03-25 DIAGNOSIS — R0602 Shortness of breath: Secondary | ICD-10-CM | POA: Diagnosis not present

## 2016-03-31 DIAGNOSIS — R0602 Shortness of breath: Secondary | ICD-10-CM | POA: Diagnosis not present

## 2016-03-31 DIAGNOSIS — I2584 Coronary atherosclerosis due to calcified coronary lesion: Secondary | ICD-10-CM | POA: Diagnosis not present

## 2016-03-31 DIAGNOSIS — I2510 Atherosclerotic heart disease of native coronary artery without angina pectoris: Secondary | ICD-10-CM | POA: Diagnosis not present

## 2016-03-31 DIAGNOSIS — E7800 Pure hypercholesterolemia, unspecified: Secondary | ICD-10-CM | POA: Diagnosis not present

## 2016-04-01 ENCOUNTER — Telehealth: Payer: Self-pay | Admitting: Emergency Medicine

## 2016-04-01 NOTE — Telephone Encounter (Signed)
Good morning J. I received the notes on Christopher Bauer date of birth [birth year] thank you. Christopher Bauer. Do you feel like he will need to catheterize him?Marland Kitchen

## 2016-04-04 ENCOUNTER — Encounter: Payer: Self-pay | Admitting: Cardiology

## 2016-04-05 NOTE — Telephone Encounter (Signed)
Good morning Christopher Bauer you are well. Medical therapy for now. I am seeing him back in 6 weeks to evaluate his symptoms

## 2016-05-19 DIAGNOSIS — E7800 Pure hypercholesterolemia, unspecified: Secondary | ICD-10-CM | POA: Diagnosis not present

## 2016-05-19 DIAGNOSIS — I2584 Coronary atherosclerosis due to calcified coronary lesion: Secondary | ICD-10-CM | POA: Diagnosis not present

## 2016-05-19 DIAGNOSIS — I2510 Atherosclerotic heart disease of native coronary artery without angina pectoris: Secondary | ICD-10-CM | POA: Diagnosis not present

## 2016-05-19 DIAGNOSIS — R0602 Shortness of breath: Secondary | ICD-10-CM | POA: Diagnosis not present

## 2016-05-20 ENCOUNTER — Other Ambulatory Visit: Payer: Self-pay | Admitting: Emergency Medicine

## 2016-05-25 ENCOUNTER — Other Ambulatory Visit: Payer: Self-pay | Admitting: Urgent Care

## 2016-05-25 ENCOUNTER — Ambulatory Visit (INDEPENDENT_AMBULATORY_CARE_PROVIDER_SITE_OTHER): Payer: Medicare HMO | Admitting: Family Medicine

## 2016-05-25 ENCOUNTER — Encounter: Payer: Self-pay | Admitting: Family Medicine

## 2016-05-25 VITALS — BP 122/76 | HR 77 | Temp 98.4°F | Resp 18 | Ht 72.0 in | Wt 250.0 lb

## 2016-05-25 DIAGNOSIS — E119 Type 2 diabetes mellitus without complications: Secondary | ICD-10-CM

## 2016-05-25 DIAGNOSIS — E782 Mixed hyperlipidemia: Secondary | ICD-10-CM | POA: Diagnosis not present

## 2016-05-25 DIAGNOSIS — R739 Hyperglycemia, unspecified: Secondary | ICD-10-CM

## 2016-05-25 DIAGNOSIS — Z23 Encounter for immunization: Secondary | ICD-10-CM | POA: Diagnosis not present

## 2016-05-25 DIAGNOSIS — Z5181 Encounter for therapeutic drug level monitoring: Secondary | ICD-10-CM

## 2016-05-25 LAB — LIPID PANEL
CHOLESTEROL: 150 mg/dL (ref <200)
HDL: 45 mg/dL (ref >40)
LDL Cholesterol: 75 mg/dL (ref <100)
Total CHOL/HDL Ratio: 3.3 Ratio (ref <5.0)
Triglycerides: 151 mg/dL — ABNORMAL HIGH (ref <150)
VLDL: 30 mg/dL (ref <30)

## 2016-05-25 LAB — COMPREHENSIVE METABOLIC PANEL
ALK PHOS: 70 U/L (ref 40–115)
ALT: 48 U/L — AB (ref 9–46)
AST: 32 U/L (ref 10–35)
Albumin: 4 g/dL (ref 3.6–5.1)
BILIRUBIN TOTAL: 0.8 mg/dL (ref 0.2–1.2)
BUN: 16 mg/dL (ref 7–25)
CALCIUM: 9.2 mg/dL (ref 8.6–10.3)
CO2: 24 mmol/L (ref 20–31)
Chloride: 101 mmol/L (ref 98–110)
Creat: 0.66 mg/dL — ABNORMAL LOW (ref 0.70–1.18)
GLUCOSE: 146 mg/dL — AB (ref 65–99)
Potassium: 4.5 mmol/L (ref 3.5–5.3)
Sodium: 134 mmol/L — ABNORMAL LOW (ref 135–146)
Total Protein: 6.3 g/dL (ref 6.1–8.1)

## 2016-05-25 LAB — GLUCOSE, POCT (MANUAL RESULT ENTRY): POC Glucose: 155 mg/dl — AB (ref 70–99)

## 2016-05-25 MED ORDER — CARVEDILOL 6.25 MG PO TABS: 6.2500 mg | ORAL_TABLET | Freq: Two times a day (BID) | ORAL | 3 refills | Status: DC

## 2016-05-25 MED ORDER — LISINOPRIL-HYDROCHLOROTHIAZIDE 20-12.5 MG PO TABS: 1.0000 | ORAL_TABLET | Freq: Every day | ORAL | 3 refills | Status: DC

## 2016-05-25 MED ORDER — ATORVASTATIN CALCIUM 40 MG PO TABS: 40.0000 mg | ORAL_TABLET | Freq: Every day | ORAL | 3 refills | Status: DC

## 2016-05-25 MED ORDER — METFORMIN HCL 1000 MG PO TABS: 1000.0000 mg | ORAL_TABLET | Freq: Two times a day (BID) | ORAL | 1 refills | Status: DC

## 2016-05-25 NOTE — Progress Notes (Signed)
Chief Complaint  Patient presents with  . Diabetes  . Flu Vaccine    HPI  Diabetes Mellitus: Patient presents for follow up of diabetes. Symptoms: hyperglycemia. Symptoms have been well-controlled. Patient denies none.  Evaluation to date has been included: fasting blood sugar.  Home sugars: patient does not check sugars. Treatment to date: Decreased dose of metformin which has been effective, Increased dose of statin which has been effective and Continued ACE inhibitor/ARB which has been effective.  Lab Results  Component Value Date   HGBA1C 7.1 02/04/2016   Pt reports that his last glucose reading was very good so his metformin wsa decreased to Metformin 1000 mg bid instead TID.   Patient ate breakfast at 2am and hist 8:44am fasting glucose was 155.  Coronary Artery Disease He reports that Dr. Everlene Farrier suggested that he meet with a Cardiologist  Dr. Einar Gip Cardiology prescribed Lipitor 40mg  and Carvedilol 6.25mg  by mouth bid He states that he plans to get a stationary bicycle for exercise.  BP Readings from Last 3 Encounters:  05/25/16 122/76  02/04/16 110/74  10/15/15 (!) 120/58   Hypertension: Patient is here for evaluation of elevated blood pressures.  Reports that he watches out for his salt intake and tries to exercise.  States that he is doing well with his meds.  He denies cough, palpitations, sob, lower extremity edema.  BP Readings from Last 3 Encounters:  05/25/16 122/76  02/04/16 110/74  10/15/15 (!) 120/58   Encounter for Medication Monitoring Pt takes meloxicam as needed and reports that he also uses voltaren gel He was started on atorvastatin by Cardiology in September He was also decrease on his metformin and is fasting for labs   Past Medical History:  Diagnosis Date  . Arthritis   . Diabetes mellitus without complication ([place])   . Hyperlipidemia   . Hypertension     Current Outpatient Prescriptions  Medication Sig Dispense Refill  . aspirin 81 MG  tablet Take 81 mg by mouth daily.    Marland Kitchen atorvastatin (LIPITOR) 40 MG tablet Take 1 tablet (40 mg total) by mouth daily. 90 tablet 3  . carvedilol (COREG) 6.25 MG tablet Take 1 tablet (6.25 mg total) by mouth 2 (two) times daily with a meal. 180 tablet 3  . diclofenac sodium (VOLTAREN) 1 % GEL Apply 4 g topically 4 (four) times daily. 100 g 3  . lisinopril-hydrochlorothiazide (PRINZIDE,ZESTORETIC) 20-12.5 MG tablet Take 1 tablet by mouth daily. 90 tablet 3  . meloxicam (MOBIC) 15 MG tablet TAKE 1 TABLET(15 MG) BY MOUTH DAILY AS NEEDED FOR PAIN 30 tablet 1  . metFORMIN (GLUCOPHAGE) 1000 MG tablet Take 1 tablet (1,000 mg total) by mouth 2 (two) times daily with a meal. 180 tablet 1   No current facility-administered medications for this visit.     Allergies: No Known Allergies  Past Surgical History:  Procedure Laterality Date  . APPENDECTOMY      Social History   Social History  . Marital status: Single    Spouse name: N/A  . Number of children: N/A  . Years of education: N/A   Social History Main Topics  . Smoking status: Current Some Day Smoker    Packs/day: 0.25    Years: 50.00    Types: Cigarettes  . Smokeless tobacco: Never Used  . Alcohol use 0.0 oz/week  . Drug use: No  . Sexual activity: Not Asked   Other Topics Concern  . None   Social History Narrative  . None  ROS  Objective: Vitals:   05/25/16 0822  BP: 122/76  Pulse: 77  Resp: 18  Temp: 98.4 F (36.9 C)  TempSrc: Oral  SpO2: 98%  Weight: 250 lb (113.4 kg)  Height: 6' (1.829 m)    Physical Exam  Constitutional: He is oriented to person, place, and time. He appears well-developed and well-nourished.  HENT:  Head: Normocephalic and atraumatic.  Eyes: Conjunctivae and EOM are normal.  Neck: Normal range of motion. Neck supple.  Cardiovascular: Normal rate, regular rhythm and normal heart sounds.   Pulmonary/Chest: Effort normal and breath sounds normal. No respiratory distress. He has no  wheezes.  Abdominal: Soft. Bowel sounds are normal. He exhibits no distension. There is no tenderness.  Musculoskeletal: Normal range of motion. He exhibits no edema.  Neurological: He is alert and oriented to person, place, and time.  Skin: Skin is warm. Capillary refill takes less than 2 seconds. No rash noted.  Psychiatric: He has a normal mood and affect. His behavior is normal. Judgment and thought content normal.     Assessment and Plan Christopher Bauer was seen today for diabetes and flu vaccine.  Diagnoses and all orders for this visit:  Type 2 diabetes mellitus without complication, without long-term current use of insulin ([place])- advised pt to continue his metformin bid Checked fasting glucose and was at 155 Discussed that he should also exercise to improve sugars -     POCT glucose (manual entry) -     Hemoglobin A1c  Need for prophylactic vaccination and inoculation against influenza -     Flu Vaccine QUAD 36+ mos IM  Hyperglycemia -     Hemoglobin A1c  Mixed hyperlipidemia- changed to lipitor by cardiology Will assess response and fax to Cardiology- Dr. Einar Gip -     Lipid panel -     Comprehensive metabolic panel  Encounter for medication monitoring- pt on metformin, bp meds and a statin as well as nsaids -     Lipid panel -     Comprehensive metabolic panel -     Hemoglobin A1c  Other orders -     atorvastatin (LIPITOR) 40 MG tablet; Take 1 tablet (40 mg total) by mouth daily. -     carvedilol (COREG) 6.25 MG tablet; Take 1 tablet (6.25 mg total) by mouth 2 (two) times daily with a meal. -     lisinopril-hydrochlorothiazide (PRINZIDE,ZESTORETIC) 20-12.5 MG tablet; Take 1 tablet by mouth daily. -     metFORMIN (GLUCOPHAGE) 1000 MG tablet; Take 1 tablet (1,000 mg total) by mouth 2 (two) times daily with a meal.  A total of 40 minutes were spent face-to-face with the patient during this encounter and over half of that time was spent on counseling and coordination of  care.    [place]

## 2016-05-25 NOTE — Patient Instructions (Addendum)
IF you received an x-ray today, you will receive an invoice from [facility]. Please contact [facility] at [phone] with questions or concerns regarding your invoice.   IF you received labwork today, you will receive an invoice from [employer]. Please contact [facility] at [phone] with questions or concerns regarding your invoice.   Our billing staff will not be able to assist you with questions regarding bills from these companies.  You will be contacted with the lab results as soon as they are available. The fastest way to get your results is to activate your My Chart account. Instructions are located on the last page of this paperwork. If you have not heard from [place] regarding the results in 2 weeks, please contact this office.     Diabetes Mellitus and Exercise Exercising regularly is important for your overall health, especially when you have diabetes (diabetes mellitus). Exercising is not only about losing weight. It has many health benefits, such as increasing muscle strength and bone density and reducing body fat and stress. This leads to improved fitness, flexibility, and endurance, all of which result in better overall health. Exercise has additional benefits for people with diabetes, including:  Reducing appetite.  Helping to lower and control blood glucose.  Lowering blood pressure.  Helping to control amounts of fatty substances (lipids) in the blood, such as cholesterol and triglycerides.  Helping the body to respond better to insulin (improving insulin sensitivity).  Reducing how much insulin the body needs.  Decreasing the risk for heart disease by:  Lowering cholesterol and triglyceride levels.  Increasing the levels of good cholesterol.  Lowering blood glucose levels. What is my activity plan? Your health care provider or certified diabetes educator can help you make a plan for the type and frequency of  exercise (activity plan) that works for you. Make sure that you:  Do at least 150 minutes of moderate-intensity or vigorous-intensity exercise each week. This could be brisk walking, biking, or water aerobics.  Do stretching and strength exercises, such as yoga or weightlifting, at least 2 times a week.  Spread out your activity over at least 3 days of the week.  Get some form of physical activity every day.  Do not go more than 2 days in a row without some kind of physical activity.  Avoid being inactive for more than 90 minutes at a time. Take frequent breaks to walk or stretch.  Choose a type of exercise or activity that you enjoy, and set realistic goals.  Start slowly, and gradually increase the intensity of your exercise over time. What do I need to know about managing my diabetes?  Check your blood glucose before and after exercising.  If your blood glucose is higher than 240 mg/dL (13.3 mmol/L) before you exercise, check your urine for ketones. If you have ketones in your urine, do not exercise until your blood glucose returns to normal.  Know the symptoms of low blood glucose (hypoglycemia) and how to treat it. Your risk for hypoglycemia increases during and after exercise. Common symptoms of hypoglycemia can include:  Hunger.  Anxiety.  Sweating and feeling clammy.  Confusion.  Dizziness or feeling light-headed.  Increased heart rate or palpitations.  Blurry vision.  Tingling or numbness around the mouth, lips, or tongue.  Tremors or shakes.  Irritability.  Keep a rapid-acting carbohydrate snack available before, during, and after exercise to help prevent or treat hypoglycemia.  Avoid injecting insulin into areas of the  body that are going to be exercised. For example, avoid injecting insulin into:  The arms, when playing tennis.  The legs, when jogging.  Keep records of your exercise habits. Doing this can help you and your health care provider adjust  your diabetes management plan as needed. Write down:  Food that you eat before and after you exercise.  Blood glucose levels before and after you exercise.  The type and amount of exercise you have done.  When your insulin is expected to peak, if you use insulin. Avoid exercising at times when your insulin is peaking.  When you start a new exercise or activity, work with your health care provider to make sure the activity is safe for you, and to adjust your insulin, medicines, or food intake as needed.  Drink plenty of water while you exercise to prevent dehydration or heat stroke. Drink enough fluid to keep your urine clear or pale yellow. This information is not intended to replace advice given to you by your health care provider. Make sure you discuss any questions you have with your health care provider. Document Released: 09/17/2003 Document Revised: 01/15/2016 Document Reviewed: 12/07/2015 Elsevier Interactive Patient Education  2017 [employer].

## 2016-05-26 LAB — HEMOGLOBIN A1C
Hgb A1c MFr Bld: 7 % — ABNORMAL HIGH (ref <5.7)
Mean Plasma Glucose: 154 mg/dL

## 2016-05-26 NOTE — Telephone Encounter (Signed)
Addressed.

## 2016-06-06 ENCOUNTER — Telehealth: Payer: Self-pay | Admitting: Emergency Medicine

## 2016-06-06 ENCOUNTER — Telehealth: Payer: Self-pay

## 2016-06-06 NOTE — Telephone Encounter (Signed)
Application for new disability parking placard placed in Dr. Nolon Rod box

## 2016-06-06 NOTE — Telephone Encounter (Signed)
Pt dropped off handicap form to be completed,gave pt copy of same and placed forms in nurses box,  Pt may be reached at [phone]

## 2016-06-14 NOTE — Telephone Encounter (Signed)
Pt picked up form

## 2016-06-26 IMAGING — CT CT CHEST W/O CM
1 series · 15 of 34 positions shown, 19 images · non-contrast
Comparison: CT chest of 05/12/2014

CLINICAL DATA: Followup pulmonary nodules, smoking history

EXAM:
CT CHEST WITHOUT CONTRAST
TECHNIQUE: Multidetector CT imaging of the chest was performed following the
standard protocol without IV contrast.

[Series 2: chest w/(date) · axial · 0.88mm/px · z∈[-364,-62]mm · 15 of 179 slices shown, 19 images]
[im 14/179  mediastinal]
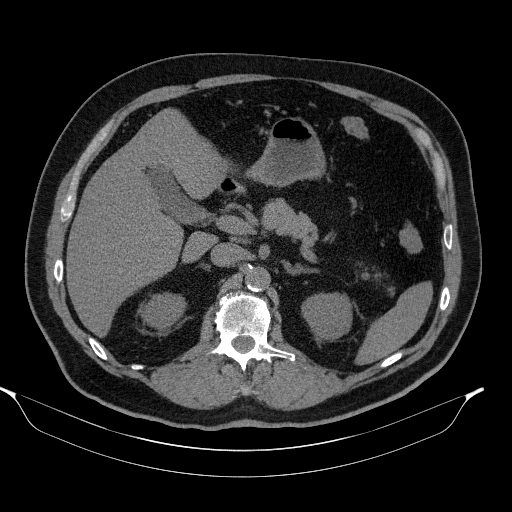
[im 14/179  lung]
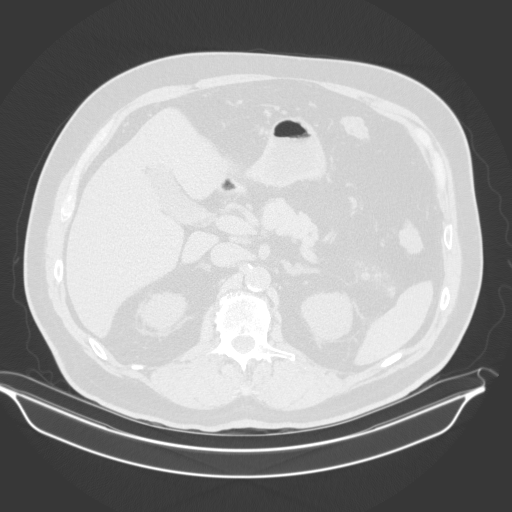
[im 27/179  lung]
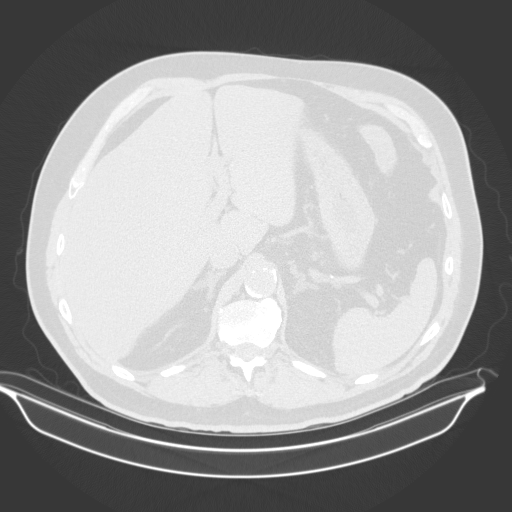
[im 36/179  lung]
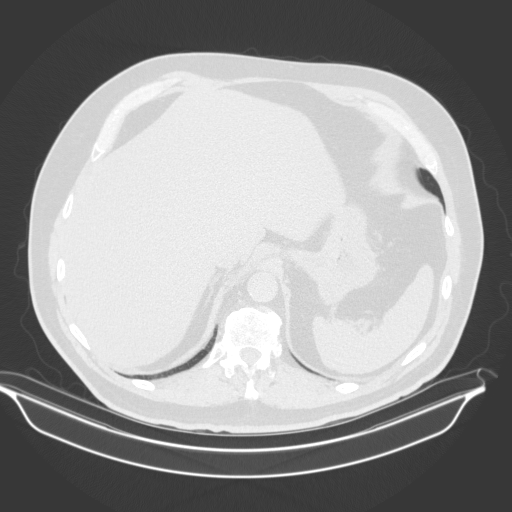
[im 47/179  lung]
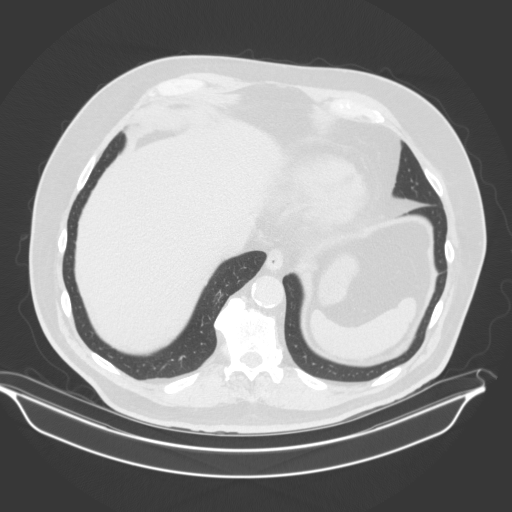
[im 60/179  mediastinal]
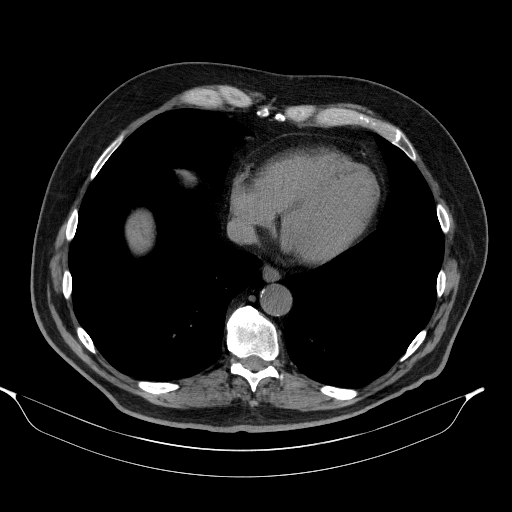
[im 60/179  lung]
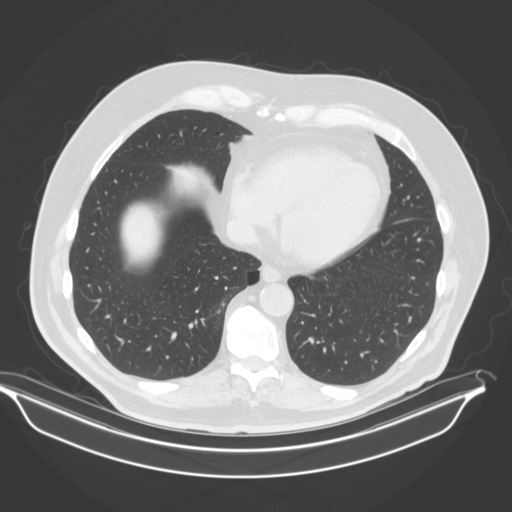
[im 72/179  lung]
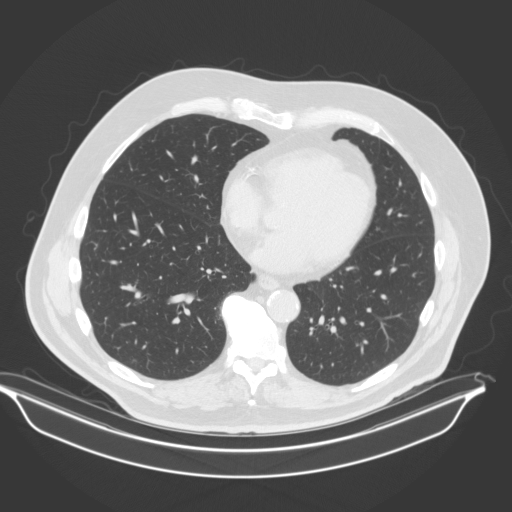
[im 80/179  lung]
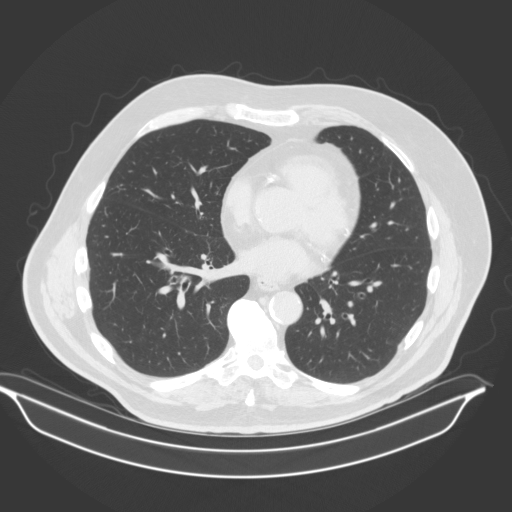
[im 93/179  lung]
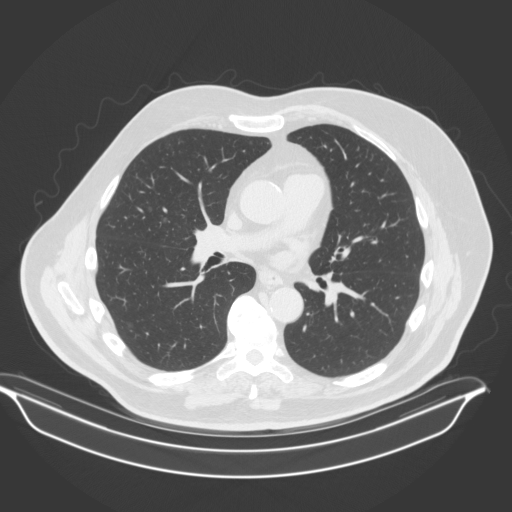
[im 99/179  mediastinal]
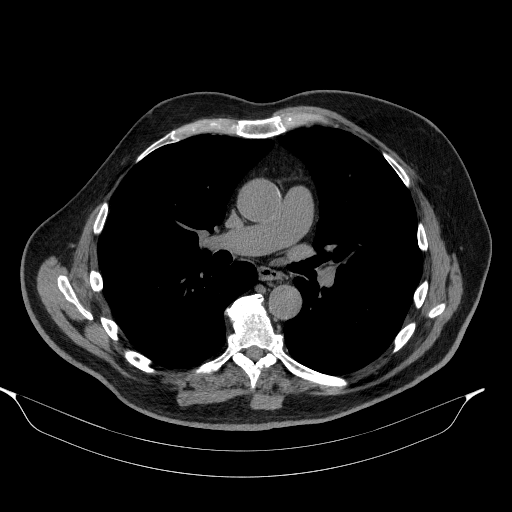
[im 99/179  lung]
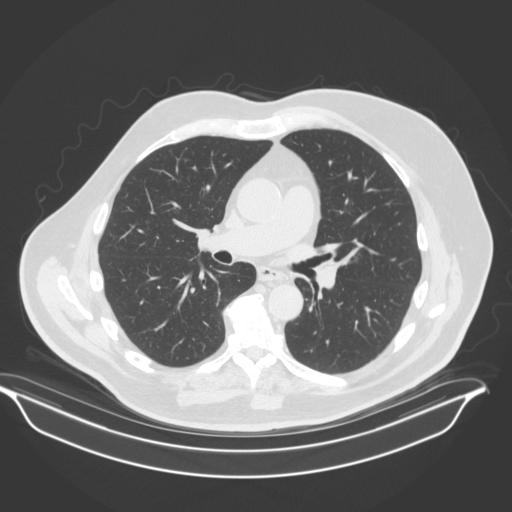
[im 107/179  lung]
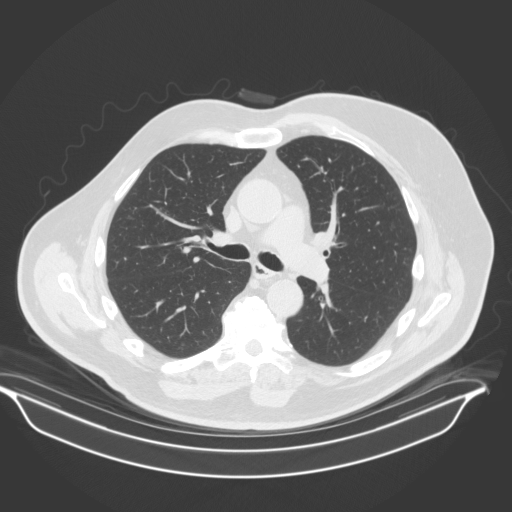
[im 119/179  lung]
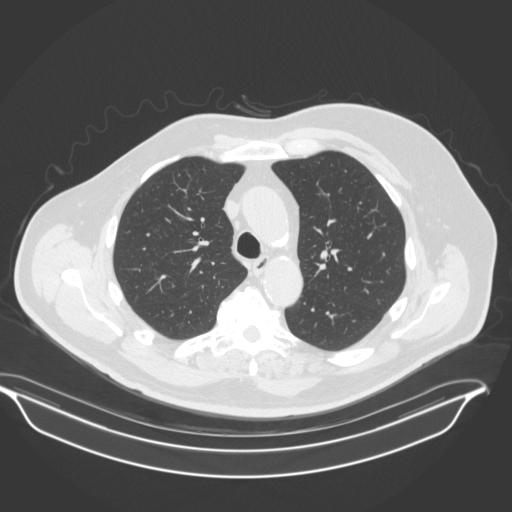
[im 132/179  lung]
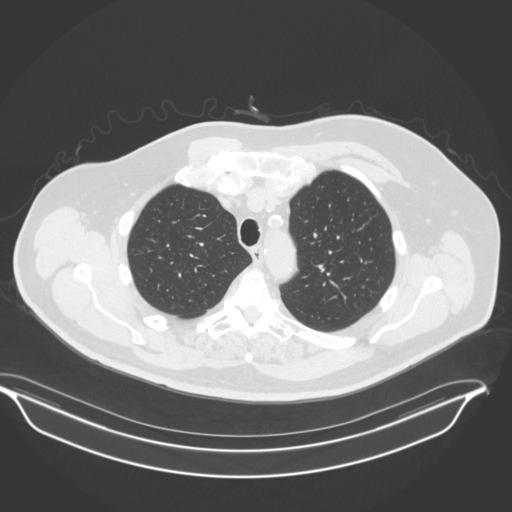
[im 143/179  mediastinal]
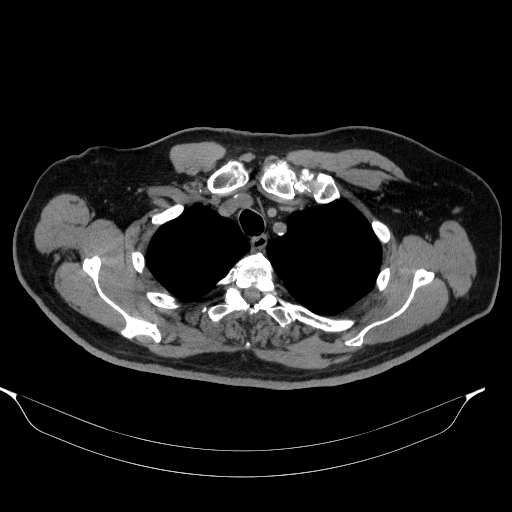
[im 143/179  lung]
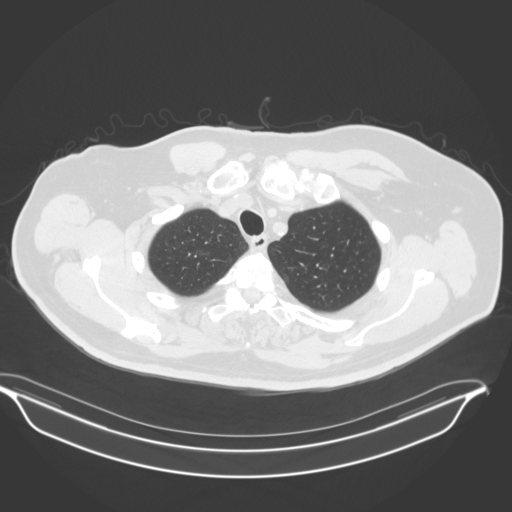
[im 152/179  lung]
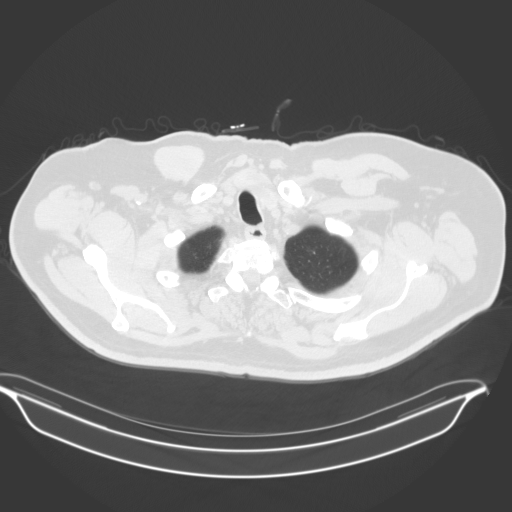
[im 165/179  lung]
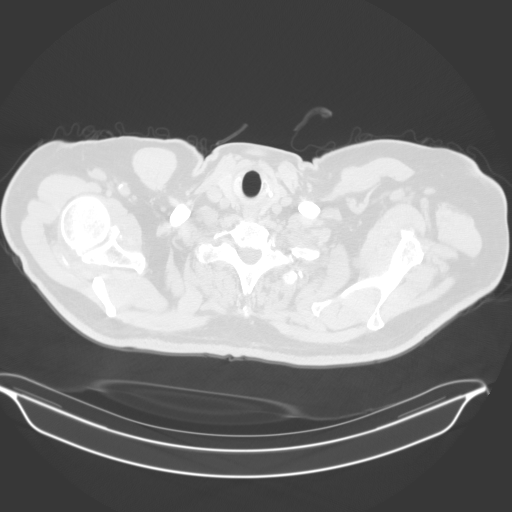

[15 of 34 positions shown; findings below may reference images not displayed]

FINDINGS: On lung window images, multiple scattered foci of ground-glass
opacity are noted throughout the lungs right more numerous than
left. The largest of these ground-glass opacities is within the
right upper lobe measuring 8 mm in diameter, unchanged and compared
to the prior CT of the chest. These ground-glass opacities were not
as well seen on the prior study but they do appear to have been
present and there do appear to be stable. This may represent a post
infectious or post inflammatory process. However in view of the
multiple ground-glass opacities, an additional followup CT of the
chest is recommended in 6-12 months. No new or definitely enlarging
ground-glass opacity is seen. No pleural effusion is noted. The
central airway is patent. Degenerative changes are noted in both
shoulders.

On soft tissue window images, the thyroid gland is inhomogeneous but
unremarkable. On this unenhanced study, no mediastinal or hilar
adenopathy is seen with only small lymph nodes present. Three vessel
coronary artery disease is noted. The portion of the upper abdomen
that is visualized is unremarkable other than at least 1 gallstone
layering in the gallbladder.
IMPRESSION: 1. Stable multiple small ground-glass opacities the largest of 8 mm
in diameter in the right upper lobe. Recommend followup CT the chest
in 6-12 months to assess stability.
2. 3 vessel coronary artery disease.
3. Gallstone layering within the gallbladder.

## 2016-07-27 ENCOUNTER — Ambulatory Visit: Payer: Medicare HMO | Admitting: Family Medicine

## 2016-07-28 ENCOUNTER — Encounter: Payer: Self-pay | Admitting: Family Medicine

## 2016-08-24 ENCOUNTER — Encounter: Payer: Self-pay | Admitting: Family Medicine

## 2016-08-24 ENCOUNTER — Ambulatory Visit (INDEPENDENT_AMBULATORY_CARE_PROVIDER_SITE_OTHER): Payer: Medicare HMO | Admitting: Family Medicine

## 2016-08-24 VITALS — BP 128/74 | HR 82 | Temp 98.3°F | Resp 18 | Ht 72.0 in | Wt 248.0 lb

## 2016-08-24 DIAGNOSIS — E119 Type 2 diabetes mellitus without complications: Secondary | ICD-10-CM

## 2016-08-24 DIAGNOSIS — I10 Essential (primary) hypertension: Secondary | ICD-10-CM

## 2016-08-24 DIAGNOSIS — M2550 Pain in unspecified joint: Secondary | ICD-10-CM | POA: Diagnosis not present

## 2016-08-24 DIAGNOSIS — E782 Mixed hyperlipidemia: Secondary | ICD-10-CM

## 2016-08-24 DIAGNOSIS — Z87891 Personal history of nicotine dependence: Secondary | ICD-10-CM | POA: Diagnosis not present

## 2016-08-24 MED ORDER — ATORVASTATIN CALCIUM 40 MG PO TABS: 40.0000 mg | ORAL_TABLET | Freq: Every day | ORAL | 3 refills | Status: DC

## 2016-08-24 MED ORDER — CARVEDILOL 6.25 MG PO TABS: 6.2500 mg | ORAL_TABLET | Freq: Two times a day (BID) | ORAL | 3 refills | Status: DC

## 2016-08-24 MED ORDER — MELOXICAM 15 MG PO TABS: ORAL_TABLET | ORAL | 1 refills | Status: DC

## 2016-08-24 MED ORDER — METFORMIN HCL 1000 MG PO TABS: 1000.0000 mg | ORAL_TABLET | Freq: Two times a day (BID) | ORAL | 1 refills | Status: DC

## 2016-08-24 MED ORDER — LISINOPRIL-HYDROCHLOROTHIAZIDE 20-12.5 MG PO TABS: 1.0000 | ORAL_TABLET | Freq: Every day | ORAL | 3 refills | Status: DC

## 2016-08-24 MED ORDER — MELOXICAM 15 MG PO TABS: ORAL_TABLET | ORAL | 3 refills | Status: DC

## 2016-08-24 NOTE — Progress Notes (Signed)
Chief Complaint  Patient presents with  . Follow-up    DIABETES    HPI   Diabetes Mellitus: Patient presents for follow up of diabetes. Symptoms: none. Symptoms have essentially resolved. Patient denies foot ulcerations, hyperglycemia, hypoglycemia , increase appetite and nausea.  Evaluation to date has been included: fasting blood sugar, fasting lipid panel and hemoglobin A1C.  Home sugars: patient does not check sugars. Treatment to date: Continued metformin which has been effective.  Lab Results  Component Value Date   HGBA1C 7.0 (H) 05/25/2016    Dyslipidemia: Patient presents for evaluation of lipids.  Compliance with treatment thus far has been excellent.  A repeat fasting lipid profile was done.  The patient does use medications that may worsen dyslipidemias (corticosteroids, progestins, anabolic steroids, diuretics, beta-blockers, amiodarone, cyclosporine, olanzapine). The patient exercises intermittently. He took a month off the lipitor after developing arthralgia since starting the lipitor.  After taking a month off he noted that his joint aches and pain are not much better.  The patient is known to have coexisting coronary artery disease.    Hypertension: Patient here for follow-up of elevated blood pressure. He is not exercising and is adherent to low salt diet.  Blood pressure is well controlled at home. Cardiac symptoms none. Patient denies chest pain, claudication, dyspnea, fatigue, irregular heart beat, lower extremity edema and palpitations.  Cardiovascular risk factors: advanced age (older than 21 for men, 71 for women), diabetes mellitus, dyslipidemia, hypertension, male gender and sedentary lifestyle. Use of agents associated with hypertension: NSAIDS.   Arthralgia He reports that he has been having arthritis issues in his hands with stiffness He reports that he now has hand stiffness and swelling He reports that mainly in the evening after going to sleep he has  swelling in the mornings He states that the right hand is worse.  Meloxicam helps him if he doubles the dose but otherwise he does not get release.   Past Medical History:  Diagnosis Date  . Arthritis   . Diabetes mellitus without complication ([place])   . Hyperlipidemia   . Hypertension     Current Outpatient Prescriptions  Medication Sig Dispense Refill  . aspirin 81 MG tablet Take 81 mg by mouth daily.    Marland Kitchen atorvastatin (LIPITOR) 40 MG tablet Take 1 tablet (40 mg total) by mouth daily. 90 tablet 3  . carvedilol (COREG) 6.25 MG tablet Take 1 tablet (6.25 mg total) by mouth 2 (two) times daily with a meal. 180 tablet 3  . lisinopril-hydrochlorothiazide (PRINZIDE,ZESTORETIC) 20-12.5 MG tablet Take 1 tablet by mouth daily. 90 tablet 3  . metFORMIN (GLUCOPHAGE) 1000 MG tablet Take 1 tablet (1,000 mg total) by mouth 2 (two) times daily with a meal. 180 tablet 1  . meloxicam (MOBIC) 15 MG tablet TAKE 1 TABLET(15 MG) BY MOUTH DAILY AS NEEDED FOR PAIN 90 tablet 3   No current facility-administered medications for this visit.     Allergies: No Known Allergies  Past Surgical History:  Procedure Laterality Date  . APPENDECTOMY      Social History   Social History  . Marital status: Single    Spouse name: N/A  . Number of children: N/A  . Years of education: N/A   Social History Main Topics  . Smoking status: Current Some Day Smoker    Packs/day: 0.25    Years: 50.00    Types: Cigarettes  . Smokeless tobacco: Never Used  . Alcohol use 0.0 oz/week  . Drug use: No  .  Sexual activity: Not Asked   Other Topics Concern  . None   Social History Narrative  . None    Review of Systems  Constitutional: Negative for chills and fever.  Eyes: Negative for blurred vision and double vision.  Respiratory: Negative for cough, shortness of breath and wheezing.   Cardiovascular: Negative for chest pain, palpitations and orthopnea.  Gastrointestinal: Negative for abdominal pain,  heartburn, nausea and vomiting.  Genitourinary: Negative for dysuria, frequency and urgency.  Musculoskeletal: Positive for joint pain. Negative for back pain and neck pain.  Skin: Negative for itching and rash.  Neurological: Negative for dizziness, tingling and headaches.  Psychiatric/Behavioral: Negative for depression. The patient does not have insomnia.     Objective: Vitals:   08/24/16 0756  BP: 128/74  Pulse: 82  Resp: 18  Temp: 98.3 F (36.8 C)  TempSrc: Oral  SpO2: 96%  Weight: 248 lb (112.5 kg)  Height: 6' (1.829 m)    Physical Exam  Constitutional: He is oriented to person, place, and time. He appears well-developed and well-nourished.  HENT:  Head: Normocephalic and atraumatic.  Right Ear: External ear normal.  Left Ear: External ear normal.  Nose: Nose normal.  Mouth/Throat: Oropharynx is clear and moist.  Eyes: Conjunctivae and EOM are normal.  Neck: Normal range of motion. Neck supple.  Cardiovascular: Normal rate, regular rhythm and normal heart sounds.   No murmur heard. Pulmonary/Chest: Effort normal and breath sounds normal. No respiratory distress. He has no wheezes.  Musculoskeletal: Normal range of motion. He exhibits no edema.  Trace edema in the right hands  Neurological: He is alert and oriented to person, place, and time. No cranial nerve deficit.  Skin: Capillary refill takes less than 2 seconds.  Psychiatric: He has a normal mood and affect. His behavior is normal. Judgment and thought content normal.    Assessment and Plan Vicente was seen today for follow-up.  Diagnoses and all orders for this visit:  Type 2 diabetes mellitus without complication, without long-term current use of insulin ([place])- his sugars were previously well controlled Will space out visits to every 6 months -     Comprehensive metabolic panel -     Hemoglobin A1c  Mixed hyperlipidemia- since pt continues to have pain despite the drug holiday from lipitor his pain is  probably from arthritis Continue lipitor -     Comprehensive metabolic panel -     Lipid panel  Essential hypertension- bp at goal, cpm -     Comprehensive metabolic panel  Former smoker- abstinent for 5 months  Polyarthralgia- discussed that he should take the meloxicam once a day instead of taking it prn which is leading to the need to doubling the dose  Other orders -     Cancel: POCT glycosylated hemoglobin (Hb A1C) -     Discontinue: meloxicam (MOBIC) 15 MG tablet; TAKE 1 TABLET(15 MG) BY MOUTH DAILY AS NEEDED FOR PAIN -     carvedilol (COREG) 6.25 MG tablet; Take 1 tablet (6.25 mg total) by mouth 2 (two) times daily with a meal. -     atorvastatin (LIPITOR) 40 MG tablet; Take 1 tablet (40 mg total) by mouth daily. -     lisinopril-hydrochlorothiazide (PRINZIDE,ZESTORETIC) 20-12.5 MG tablet; Take 1 tablet by mouth daily. -     metFORMIN (GLUCOPHAGE) 1000 MG tablet; Take 1 tablet (1,000 mg total) by mouth 2 (two) times daily with a meal. -     meloxicam (MOBIC) 15 MG tablet; TAKE 1  TABLET(15 MG) BY MOUTH DAILY AS NEEDED FOR PAIN  A total of 40 minutes were spent face-to-face with the patient during this encounter and over half of that time was spent on counseling and coordination of care.    [place]

## 2016-08-24 NOTE — Patient Instructions (Signed)
     IF you received an x-ray today, you will receive an invoice from [facility]. Please contact [facility] at [phone] with questions or concerns regarding your invoice.   IF you received labwork today, you will receive an invoice from [facility]. Please contact [facility] at [phone] with questions or concerns regarding your invoice.   Our billing staff will not be able to assist you with questions regarding bills from these companies.  You will be contacted with the lab results as soon as they are available. The fastest way to get your results is to activate your My Chart account. Instructions are located on the last page of this paperwork. If you have not heard from us regarding the results in 2 weeks, please contact this office.     

## 2016-08-25 LAB — COMPREHENSIVE METABOLIC PANEL
A/G RATIO: 1.9 (ref 1.2–2.2)
ALT: 68 IU/L — AB (ref 0–44)
AST: 57 IU/L — AB (ref 0–40)
Albumin: 4.2 g/dL (ref 3.5–4.8)
Alkaline Phosphatase: 82 IU/L (ref 39–117)
BILIRUBIN TOTAL: 0.8 mg/dL (ref 0.0–1.2)
BUN/Creatinine Ratio: 22 (ref 10–24)
BUN: 18 mg/dL (ref 8–27)
CALCIUM: 9.1 mg/dL (ref 8.6–10.2)
CHLORIDE: 99 mmol/L (ref 96–106)
CO2: 20 mmol/L (ref 18–29)
Creatinine, Ser: 0.81 mg/dL (ref 0.76–1.27)
GFR calc Af Amer: 103 mL/min/{1.73_m2} (ref >59)
GFR calc non Af Amer: 89 mL/min/{1.73_m2} (ref >59)
GLUCOSE: 168 mg/dL — AB (ref 65–99)
Globulin, Total: 2.2 g/dL (ref 1.5–4.5)
POTASSIUM: 4.6 mmol/L (ref 3.5–5.2)
Sodium: 139 mmol/L (ref 134–144)
Total Protein: 6.4 g/dL (ref 6.0–8.5)

## 2016-08-25 LAB — LIPID PANEL
Chol/HDL Ratio: 3.7 ratio units (ref 0.0–5.0)
Cholesterol, Total: 159 mg/dL (ref 100–199)
HDL: 43 mg/dL (ref >39)
LDL Calculated: 87 mg/dL (ref 0–99)
TRIGLYCERIDES: 144 mg/dL (ref 0–149)
VLDL Cholesterol Cal: 29 mg/dL (ref 5–40)

## 2016-08-25 LAB — HEMOGLOBIN A1C
Est. average glucose Bld gHb Est-mCnc: 166 mg/dL
HEMOGLOBIN A1C: 7.4 % — AB (ref 4.8–5.6)

## 2016-10-20 DIAGNOSIS — E782 Mixed hyperlipidemia: Secondary | ICD-10-CM | POA: Diagnosis not present

## 2016-10-20 DIAGNOSIS — E669 Obesity, unspecified: Secondary | ICD-10-CM | POA: Diagnosis not present

## 2016-10-20 DIAGNOSIS — Z791 Long term (current) use of non-steroidal anti-inflammatories (NSAID): Secondary | ICD-10-CM | POA: Diagnosis not present

## 2016-10-20 DIAGNOSIS — Z6835 Body mass index (BMI) 35.0-35.9, adult: Secondary | ICD-10-CM | POA: Diagnosis not present

## 2016-10-20 DIAGNOSIS — M13161 Monoarthritis, not elsewhere classified, right knee: Secondary | ICD-10-CM | POA: Diagnosis not present

## 2016-10-20 DIAGNOSIS — Z0000 Encounter for general adult medical examination without abnormal findings: Secondary | ICD-10-CM | POA: Diagnosis not present

## 2016-10-20 DIAGNOSIS — E119 Type 2 diabetes mellitus without complications: Secondary | ICD-10-CM | POA: Diagnosis not present

## 2016-10-20 DIAGNOSIS — Z87891 Personal history of nicotine dependence: Secondary | ICD-10-CM | POA: Diagnosis not present

## 2016-10-20 DIAGNOSIS — Z7982 Long term (current) use of aspirin: Secondary | ICD-10-CM | POA: Diagnosis not present

## 2016-10-20 DIAGNOSIS — M13162 Monoarthritis, not elsewhere classified, left knee: Secondary | ICD-10-CM | POA: Diagnosis not present

## 2016-10-20 DIAGNOSIS — I10 Essential (primary) hypertension: Secondary | ICD-10-CM | POA: Diagnosis not present

## 2016-12-01 DIAGNOSIS — I2584 Coronary atherosclerosis due to calcified coronary lesion: Secondary | ICD-10-CM | POA: Diagnosis not present

## 2016-12-01 DIAGNOSIS — E7800 Pure hypercholesterolemia, unspecified: Secondary | ICD-10-CM | POA: Diagnosis not present

## 2016-12-01 DIAGNOSIS — R0602 Shortness of breath: Secondary | ICD-10-CM | POA: Diagnosis not present

## 2016-12-01 DIAGNOSIS — I2510 Atherosclerotic heart disease of native coronary artery without angina pectoris: Secondary | ICD-10-CM | POA: Diagnosis not present

## 2016-12-01 DIAGNOSIS — R748 Abnormal levels of other serum enzymes: Secondary | ICD-10-CM | POA: Diagnosis not present

## 2017-01-27 ENCOUNTER — Telehealth: Payer: Self-pay | Admitting: Family Medicine

## 2017-01-27 NOTE — Telephone Encounter (Signed)
LVM for pt re. reschedule of appmt. On 02/22/17. Asked that he call office.

## 2017-01-30 ENCOUNTER — Telehealth: Payer: Self-pay | Admitting: Family Medicine

## 2017-01-30 NOTE — Telephone Encounter (Addendum)
PATIENT STATES SOME TIME IN June DR. Adrian Prows ([place]) TOLD HIM THAT HE NEEDED TO TALK WITH DR. Nolon Rod TO RECOMMEND A MEDICATION CHANGE. HE WAS NOT SURE WHAT MEDICATION CHANGE DR. GANJI WAS REFERRING TO BUT HE THINKS IT MAY BE HIS CHOLESTROL. HE WANTS TO BE SURE DR. STALLINGS SPEAKS WITH DR. GANJI BEFORE HIS APPOINTMENT WITH HER ON 02/15/17. BEST PHONE [phone] (PATIENT CELL) [place]

## 2017-01-31 NOTE — Telephone Encounter (Signed)
I spoke with Dr. Irven Shelling nurse [place]. She stated there is not a note in the pts chart regarding a medication change. She will speak with Dr. Einar Gip and call our office tomorrow.

## 2017-02-02 NOTE — Telephone Encounter (Signed)
JENNIFER FROM DR Einar Gip OFFICE CALLED STATING THAT THE ONLY MEDICINE CHANGE WAS TO DISCONTINUE PRAVASTATIN AND START ON ATORVASTATIN THAT THAT'S BEEN A WHILE AGO SINCE THE CHANGE

## 2017-02-06 NOTE — Telephone Encounter (Signed)
FYI

## 2017-02-13 ENCOUNTER — Other Ambulatory Visit: Payer: Self-pay | Admitting: Family Medicine

## 2017-02-15 ENCOUNTER — Encounter: Payer: Self-pay | Admitting: Family Medicine

## 2017-02-15 ENCOUNTER — Ambulatory Visit (INDEPENDENT_AMBULATORY_CARE_PROVIDER_SITE_OTHER): Payer: Medicare HMO | Admitting: Family Medicine

## 2017-02-15 ENCOUNTER — Ambulatory Visit (INDEPENDENT_AMBULATORY_CARE_PROVIDER_SITE_OTHER): Payer: Medicare HMO

## 2017-02-15 VITALS — BP 116/72 | HR 78 | Temp 97.6°F | Resp 16 | Ht 72.0 in | Wt 248.6 lb

## 2017-02-15 DIAGNOSIS — M25562 Pain in left knee: Secondary | ICD-10-CM

## 2017-02-15 DIAGNOSIS — Z23 Encounter for immunization: Secondary | ICD-10-CM | POA: Diagnosis not present

## 2017-02-15 DIAGNOSIS — Z87891 Personal history of nicotine dependence: Secondary | ICD-10-CM | POA: Diagnosis not present

## 2017-02-15 DIAGNOSIS — I25810 Atherosclerosis of coronary artery bypass graft(s) without angina pectoris: Secondary | ICD-10-CM | POA: Diagnosis not present

## 2017-02-15 DIAGNOSIS — E1169 Type 2 diabetes mellitus with other specified complication: Secondary | ICD-10-CM | POA: Diagnosis not present

## 2017-02-15 DIAGNOSIS — M67431 Ganglion, right wrist: Secondary | ICD-10-CM | POA: Diagnosis not present

## 2017-02-15 DIAGNOSIS — M25462 Effusion, left knee: Secondary | ICD-10-CM | POA: Diagnosis not present

## 2017-02-15 DIAGNOSIS — E669 Obesity, unspecified: Secondary | ICD-10-CM | POA: Diagnosis not present

## 2017-02-15 MED ORDER — INDOMETHACIN 50 MG PO CAPS: 50.0000 mg | ORAL_CAPSULE | Freq: Two times a day (BID) | ORAL | 0 refills | Status: DC

## 2017-02-15 NOTE — Progress Notes (Signed)
Chief Complaint  Patient presents with  . Follow-up    hypertension, pt c/o l/r knee swelling, pt has hx of gout in the past and thinks it maybe flaring, also c/o r/l hand swellling,  swellling in middle and ring finger right hand, ? trigger finger.  Per pt he is taking aleve and it is relieving the pain some.    HPI  Former smoker - quit smoking about a year ago in March 16, 2016 He states that his breathing has gotten better  Obesity- he reports that he his cutting down on his eating to try to lose weight.  Body mass index is 33.72 kg/m. Wt Readings from Last 3 Encounters:  02/15/17 248 lb 9.6 oz (112.8 kg)  08/24/16 248 lb (112.5 kg)  05/25/16 250 lb (113.4 kg)   Diabetes Pt has a history of diabetes mellitus Patient reports that he is doing well with his diabetes for which he takes metformin 1000mg  bid He denies side effects  He takes it every day He is not checking blood glucoses Lab Results  Component Value Date   HGBA1C 7.4 (H) 08/24/2016    CAD He denies chest pains, palpitations or shortness of breath  He denies any stents placed.  He reports that he smoked for 50 years then quit.  He is on lipitor daily at 40mg  and aspirin 81mg  He does not exercise due to his joint pains His cardiologist wanted him to try Jardiance or Glyxambi but he declined taking any additional medications at this time. Lab Results  Component Value Date   CHOL 159 08/24/2016   HDL 43 08/24/2016   LDLCALC 87 08/24/2016   TRIG 144 08/24/2016   CHOLHDL 3.7 08/24/2016    Left knee pain Pt reports tha the developed a hot red knee of the left He states that he started having swelling of the knee a couple of months a gout He reports that he typically gets gout in the feet He states that this pain now reminds him of gout  He also started having contractures of the index and ring finger He reports that he was taking meloxicam and alleve He also has a ganglion cyst on the right wrist  Past  Medical History:  Diagnosis Date  . Arthritis   . Diabetes mellitus without complication ([place])   . Hyperlipidemia   . Hypertension     Current Outpatient Prescriptions  Medication Sig Dispense Refill  . aspirin 81 MG tablet Take 81 mg by mouth daily.    Marland Kitchen atorvastatin (LIPITOR) 40 MG tablet Take 1 tablet (40 mg total) by mouth daily. 90 tablet 3  . carvedilol (COREG) 6.25 MG tablet Take 1 tablet (6.25 mg total) by mouth 2 (two) times daily with a meal. 180 tablet 3  . lisinopril-hydrochlorothiazide (PRINZIDE,ZESTORETIC) 20-12.5 MG tablet Take 1 tablet by mouth daily. 90 tablet 3  . meloxicam (MOBIC) 15 MG tablet TAKE 1 TABLET(15 MG) BY MOUTH DAILY AS NEEDED FOR PAIN 90 tablet 3  . metFORMIN (GLUCOPHAGE) 1000 MG tablet Take 1 tablet (1,000 mg total) by mouth 2 (two) times daily with a meal. 180 tablet 1   No current facility-administered medications for this visit.     Allergies: No Known Allergies  Past Surgical History:  Procedure Laterality Date  . APPENDECTOMY      Social History   Social History  . Marital status: Single    Spouse name: N/A  . Number of children: N/A  . Years of education: N/A  Social History Main Topics  . Smoking status: Former Smoker    Packs/day: 0.25    Years: 50.00    Types: Cigarettes    Quit date: 03/18/2016  . Smokeless tobacco: Never Used  . Alcohol use 0.0 oz/week  . Drug use: No  . Sexual activity: Not Asked   Other Topics Concern  . None   Social History Narrative  . None    ROS See hpi Review of Systems See HPI Constitution: No fevers or chills No malaise No diaphoresis Skin: No rash or itching Eyes: no blurry vision, no double vision GU: no dysuria or hematuria Neuro: no dizziness or headaches  Objective: Vitals:   02/15/17 0824  BP: 116/72  Pulse: 78  Resp: 16  Temp: 97.6 F (36.4 C)  TempSrc: Oral  SpO2: 96%  Weight: 248 lb 9.6 oz (112.8 kg)  Height: 6' (1.829 m)    Physical Exam  Constitutional: He  is oriented to person, place, and time. He appears well-developed and well-nourished.  HENT:  Head: Normocephalic and atraumatic.  Eyes: Conjunctivae and EOM are normal.  Cardiovascular: Normal rate, regular rhythm and normal heart sounds.   Pulmonary/Chest: Effort normal and breath sounds normal. No respiratory distress. He has no wheezes.  Neurological: He is alert and oriented to person, place, and time.  Skin: Skin is warm. No erythema.  Psychiatric: He has a normal mood and affect. His behavior is normal. Judgment and thought content normal.    Left knee with pocket of effusion along the medial joint line Crepitus with range of motion Stiffness No patellar apprehension  DG Knee Complete 4 Views Left  CLINICAL DATA:  Left knee pain, erythema and effusion.  EXAM: LEFT KNEE - COMPLETE 4+ VIEW  COMPARISON:  None.  FINDINGS: Tricompartment osteophytosis. Chondrocalcinosis in the medial and lateral compartments. Subchondral sclerosis and mild joint space narrowing in the medial compartment. There may be an osteochondral defect along the articular surface of the lateral femoral condyle. No joint effusion. Vascular calcifications.  IMPRESSION: 1. Possible osteochondral defect along the articular surface of the lateral femoral condyles. 2. Tricompartment osteoarthritis.   Electronically Signed   By: Lorin Picket M.D.   On: 02/15/2017 09:39      Assessment and Plan Madix was seen today for follow-up.  Diagnoses and all orders for this visit:  Acute pain of left knee-  Advised follow up with Orthopedics Will check for gout today with uric acid Since his symptoms are improving he may be recovering so discussed that the diagnosis may not be able to be made definitely today -     Basic metabolic panel -     Uric Acid -     Sedimentation Rate -     DG Knee Complete 4 Views Left  Ganglion cyst of wrist, right- see ORTHO  Coronary artery disease involving  coronary bypass graft of native heart without angina pectoris- Pt declined a second medication Jardiance He will continue aspirin, lipitor, diet and exercise  Diabetes mellitus type 2 in obese ([place])- will assess but so far is doing well on metformin bid -     Hemoglobin A1c  Former smoker- continued encouragement towards smoking cessation  Other orders -     Cancel: Ambulatory referral to Gastroenterology -     Tdap vaccine greater than or equal to 7yo IM   A total of 40 minutes were spent face-to-face with the patient during this encounter and over half of that time was spent on  counseling and coordination of care.   [place]

## 2017-02-15 NOTE — Patient Instructions (Addendum)
   IF you received an x-ray today, you will receive an invoice from [facility]. Please contact [facility] at [phone] with questions or concerns regarding your invoice.   IF you received labwork today, you will receive an invoice from [facility]. Please contact [facility] at [phone] with questions or concerns regarding your invoice.   Our billing staff will not be able to assist you with questions regarding bills from these companies.  You will be contacted with the lab results as soon as they are available. The fastest way to get your results is to activate your My Chart account. Instructions are located on the last page of this paperwork. If you have not heard from us regarding the results in 2 weeks, please contact this office.     Ganglion Cyst A ganglion cyst is a noncancerous, fluid-filled lump that occurs near joints or tendons. The ganglion cyst grows out of a joint or the lining of a tendon. It most often develops in the hand or wrist, but it can also develop in the shoulder, elbow, hip, knee, ankle, or foot. The round or oval ganglion cyst can be the size of a pea or larger than a grape. Increased activity may enlarge the size of the cyst because more fluid starts to build up. What are the causes? It is not known what causes a ganglion cyst to grow. However, it may be related to:  Inflammation or irritation around the joint.  An injury.  Repetitive movements or overuse.  Arthritis.  What increases the risk? Risk factors include:  Being a woman.  Being age 20-50.  What are the signs or symptoms? Symptoms may include:  A lump. This most often appears on the hand or wrist, but it can occur in other areas of the body.  Tingling.  Pain.  Numbness.  Muscle weakness.  Weak grip.  Less movement in a joint.  How is this diagnosed? Ganglion cysts are most often diagnosed based on a physical exam. Your health care provider will feel the  lump and may shine a light alongside it. If it is a ganglion cyst, a light often shines through it. Your health care provider may order an X-ray, ultrasound, or MRI to rule out other conditions. How is this treated? Ganglion cysts usually go away on their own without treatment. If pain or other symptoms are involved, treatment may be needed. Treatment is also needed if the ganglion cyst limits your movement or if it gets infected. Treatment may include:  Wearing a brace or splint on your wrist or finger.  Taking anti-inflammatory medicine.  Draining fluid from the lump with a needle (aspiration).  Injecting a steroid into the joint.  Surgery to remove the ganglion cyst.  Follow these instructions at home:  Do not press on the ganglion cyst, poke it with a needle, or hit it.  Take medicines only as directed by your health care provider.  Wear your brace or splint as directed by your health care provider.  Watch your ganglion cyst for any changes.  Keep all follow-up visits as directed by your health care provider. This is important. Contact a health care provider if:  Your ganglion cyst becomes larger or more painful.  You have increased redness, red streaks, or swelling.  You have pus coming from the lump.  You have weakness or numbness in the affected area.  You have a fever or chills. This information is not intended to replace advice given to you by your   health care provider. Make sure you discuss any questions you have with your health care provider. Document Released: 06/24/2000 Document Revised: 12/03/2015 Document Reviewed: 12/10/2013 Elsevier Interactive Patient Education  2018 [employer].

## 2017-02-16 LAB — URIC ACID: URIC ACID: 6.7 mg/dL (ref 3.7–8.6)

## 2017-02-16 LAB — BASIC METABOLIC PANEL
BUN / CREAT RATIO: 27 — AB (ref 10–24)
BUN: 22 mg/dL (ref 8–27)
CALCIUM: 10.4 mg/dL — AB (ref 8.6–10.2)
CHLORIDE: 98 mmol/L (ref 96–106)
CO2: 20 mmol/L (ref 20–29)
Creatinine, Ser: 0.83 mg/dL (ref 0.76–1.27)
GFR calc non Af Amer: 89 mL/min/{1.73_m2} (ref >59)
GFR, EST AFRICAN AMERICAN: 102 mL/min/{1.73_m2} (ref >59)
GLUCOSE: 160 mg/dL — AB (ref 65–99)
Potassium: 4.8 mmol/L (ref 3.5–5.2)
Sodium: 139 mmol/L (ref 134–144)

## 2017-02-16 LAB — HEMOGLOBIN A1C
ESTIMATED AVERAGE GLUCOSE: 131 mg/dL
HEMOGLOBIN A1C: 6.2 % — AB (ref 4.8–5.6)

## 2017-02-16 LAB — SEDIMENTATION RATE: Sed Rate: 50 mm/hr — ABNORMAL HIGH (ref 0–30)

## 2017-02-22 ENCOUNTER — Ambulatory Visit: Payer: Medicare HMO | Admitting: Family Medicine

## 2017-02-23 ENCOUNTER — Telehealth: Payer: Self-pay

## 2017-02-23 NOTE — Telephone Encounter (Signed)
Type in indomethacin instead. The Indocin is only as a suppository of liquid suspension when I type it in.

## 2017-02-23 NOTE — Telephone Encounter (Signed)
Trying to start PA on Cover My Meds for this patient, however, when I type in the medication INDOCIN 50 mg Capsules, it says that INDOCIN 50 mg only comes in suppositories.  Please advise.Marland KitchenMarland Kitchen

## 2017-02-28 NOTE — Telephone Encounter (Signed)
Received response from Dr. Nolon Rod regarding patient's medication.  Started PA on Cover My Meds.  Key code is [id number] and should take 5 business days for response.

## 2017-03-01 NOTE — Telephone Encounter (Signed)
Received fax re: patient's medication for gout.  Placed in Dr. Kaleen Mask box for her to complete  There is a fax number on form for her assistant to fax back when signed off by provider.

## 2017-03-03 ENCOUNTER — Telehealth: Payer: Self-pay

## 2017-03-03 NOTE — Telephone Encounter (Signed)
Dr. Nolon Rod, Received fax denying coverage for INDOMETHACIN from Medicare.  They need patient to have tried and failed Allopurinol (generic tablets), Uloric (febuxostat), Generic Colchicine, or Colcrys (colchicine.) before they will approve the Indomethacin.   Please advise.. Thanks,  Evorn Gong

## 2017-03-09 ENCOUNTER — Encounter: Payer: Self-pay | Admitting: Family Medicine

## 2017-03-09 DIAGNOSIS — M170 Bilateral primary osteoarthritis of knee: Secondary | ICD-10-CM | POA: Diagnosis not present

## 2017-03-30 DIAGNOSIS — G5601 Carpal tunnel syndrome, right upper limb: Secondary | ICD-10-CM | POA: Diagnosis not present

## 2017-03-30 DIAGNOSIS — M67431 Ganglion, right wrist: Secondary | ICD-10-CM | POA: Diagnosis not present

## 2017-04-10 DIAGNOSIS — G5601 Carpal tunnel syndrome, right upper limb: Secondary | ICD-10-CM | POA: Diagnosis not present

## 2017-04-11 DIAGNOSIS — G5601 Carpal tunnel syndrome, right upper limb: Secondary | ICD-10-CM | POA: Diagnosis not present

## 2017-04-11 DIAGNOSIS — M65341 Trigger finger, right ring finger: Secondary | ICD-10-CM | POA: Diagnosis not present

## 2017-04-11 DIAGNOSIS — M67431 Ganglion, right wrist: Secondary | ICD-10-CM | POA: Diagnosis not present

## 2017-05-03 ENCOUNTER — Other Ambulatory Visit: Payer: Self-pay

## 2017-05-03 DIAGNOSIS — G5601 Carpal tunnel syndrome, right upper limb: Secondary | ICD-10-CM | POA: Diagnosis not present

## 2017-05-03 DIAGNOSIS — G5603 Carpal tunnel syndrome, bilateral upper limbs: Secondary | ICD-10-CM | POA: Diagnosis not present

## 2017-05-03 DIAGNOSIS — G8918 Other acute postprocedural pain: Secondary | ICD-10-CM | POA: Diagnosis not present

## 2017-05-03 DIAGNOSIS — M67431 Ganglion, right wrist: Secondary | ICD-10-CM | POA: Diagnosis not present

## 2017-05-05 ENCOUNTER — Encounter: Payer: Self-pay | Admitting: Family Medicine

## 2017-05-18 ENCOUNTER — Encounter: Payer: Self-pay | Admitting: Family Medicine

## 2017-05-18 DIAGNOSIS — H02834 Dermatochalasis of left upper eyelid: Secondary | ICD-10-CM | POA: Diagnosis not present

## 2017-05-18 DIAGNOSIS — H2513 Age-related nuclear cataract, bilateral: Secondary | ICD-10-CM | POA: Diagnosis not present

## 2017-05-18 DIAGNOSIS — H02831 Dermatochalasis of right upper eyelid: Secondary | ICD-10-CM | POA: Diagnosis not present

## 2017-05-18 DIAGNOSIS — E119 Type 2 diabetes mellitus without complications: Secondary | ICD-10-CM | POA: Diagnosis not present

## 2017-05-18 LAB — HM DIABETES EYE EXAM

## 2017-05-19 DIAGNOSIS — H52209 Unspecified astigmatism, unspecified eye: Secondary | ICD-10-CM | POA: Diagnosis not present

## 2017-05-19 DIAGNOSIS — G5601 Carpal tunnel syndrome, right upper limb: Secondary | ICD-10-CM | POA: Diagnosis not present

## 2017-05-19 DIAGNOSIS — H5213 Myopia, bilateral: Secondary | ICD-10-CM | POA: Diagnosis not present

## 2017-06-08 DIAGNOSIS — R9439 Abnormal result of other cardiovascular function study: Secondary | ICD-10-CM | POA: Diagnosis not present

## 2017-06-08 DIAGNOSIS — E7800 Pure hypercholesterolemia, unspecified: Secondary | ICD-10-CM | POA: Diagnosis not present

## 2017-06-08 DIAGNOSIS — I2510 Atherosclerotic heart disease of native coronary artery without angina pectoris: Secondary | ICD-10-CM | POA: Diagnosis not present

## 2017-06-08 DIAGNOSIS — I2584 Coronary atherosclerosis due to calcified coronary lesion: Secondary | ICD-10-CM | POA: Diagnosis not present

## 2017-06-27 ENCOUNTER — Other Ambulatory Visit: Payer: Self-pay | Admitting: Family Medicine

## 2017-07-02 ENCOUNTER — Other Ambulatory Visit: Payer: Self-pay | Admitting: Family Medicine

## 2017-08-02 ENCOUNTER — Other Ambulatory Visit: Payer: Self-pay | Admitting: Family Medicine

## 2017-08-23 ENCOUNTER — Ambulatory Visit: Payer: Medicare HMO | Admitting: Family Medicine

## 2017-08-24 ENCOUNTER — Other Ambulatory Visit: Payer: Self-pay

## 2017-08-24 ENCOUNTER — Ambulatory Visit (INDEPENDENT_AMBULATORY_CARE_PROVIDER_SITE_OTHER): Payer: Medicare HMO | Admitting: Family Medicine

## 2017-08-24 ENCOUNTER — Ambulatory Visit (INDEPENDENT_AMBULATORY_CARE_PROVIDER_SITE_OTHER): Payer: Medicare HMO

## 2017-08-24 ENCOUNTER — Encounter: Payer: Self-pay | Admitting: Family Medicine

## 2017-08-24 VITALS — BP 126/82 | HR 84 | Temp 98.0°F | Resp 16 | Ht 72.0 in | Wt 232.5 lb

## 2017-08-24 VITALS — BP 126/82 | HR 86 | Ht 72.0 in | Wt 232.5 lb

## 2017-08-24 DIAGNOSIS — Z0000 Encounter for general adult medical examination without abnormal findings: Secondary | ICD-10-CM | POA: Diagnosis not present

## 2017-08-24 DIAGNOSIS — E119 Type 2 diabetes mellitus without complications: Secondary | ICD-10-CM

## 2017-08-24 DIAGNOSIS — E7800 Pure hypercholesterolemia, unspecified: Secondary | ICD-10-CM | POA: Diagnosis not present

## 2017-08-24 DIAGNOSIS — E669 Obesity, unspecified: Secondary | ICD-10-CM | POA: Diagnosis not present

## 2017-08-24 DIAGNOSIS — I10 Essential (primary) hypertension: Secondary | ICD-10-CM | POA: Diagnosis not present

## 2017-08-24 DIAGNOSIS — E782 Mixed hyperlipidemia: Secondary | ICD-10-CM

## 2017-08-24 DIAGNOSIS — I25810 Atherosclerosis of coronary artery bypass graft(s) without angina pectoris: Secondary | ICD-10-CM | POA: Diagnosis not present

## 2017-08-24 DIAGNOSIS — M2550 Pain in unspecified joint: Secondary | ICD-10-CM | POA: Diagnosis not present

## 2017-08-24 DIAGNOSIS — E1169 Type 2 diabetes mellitus with other specified complication: Secondary | ICD-10-CM | POA: Diagnosis not present

## 2017-08-24 MED ORDER — ATORVASTATIN CALCIUM 40 MG PO TABS: 40.0000 mg | ORAL_TABLET | Freq: Every day | ORAL | 3 refills | Status: DC

## 2017-08-24 MED ORDER — LISINOPRIL-HYDROCHLOROTHIAZIDE 20-12.5 MG PO TABS: 1.0000 | ORAL_TABLET | Freq: Every day | ORAL | 3 refills | Status: DC

## 2017-08-24 NOTE — Progress Notes (Signed)
Subjective:   Christopher Bauer is a 72 y.o. male who presents for an Initial Medicare Annual Wellness Visit.  Review of Systems  N/A Cardiac Risk Factors include: advanced age (>44men, >49 women);male gender;obesity (BMI >30kg/m2);diabetes mellitus;dyslipidemia    Objective:    Today's Vitals   08/24/17 0904 08/24/17 0912  BP: 126/82   Pulse: 86   SpO2: 96%   Weight: 232 lb 8 oz (105.5 kg)   Height: 6' (1.829 m)   PainSc:  3    Body mass index is 31.53 kg/m.  Advanced Directives 08/24/2017  Does Patient Have a Medical Advance Directive? No  Would patient like information on creating a medical advance directive? Yes (MAU/Ambulatory/Procedural Areas - Information given)    Current Medications (verified) Outpatient Encounter Medications as of 08/24/2017  Medication Sig  . aspirin 81 MG tablet Take 81 mg by mouth daily.  Marland Kitchen atorvastatin (LIPITOR) 40 MG tablet Take 1 tablet (40 mg total) by mouth daily.  . carvedilol (COREG) 6.25 MG tablet TAKE 1 TABLET TWICE DAILY  WITH MEALS  . lisinopril-hydrochlorothiazide (PRINZIDE,ZESTORETIC) 20-12.5 MG tablet Take 1 tablet by mouth daily.  . meloxicam (MOBIC) 15 MG tablet TAKE 1 TABLET DAILY AS     NEEDED FOR PAIN  . metFORMIN (GLUCOPHAGE) 1000 MG tablet TAKE 1 TABLET TWICE DAILY  WITH MEALS  . [DISCONTINUED] atorvastatin (LIPITOR) 40 MG tablet TAKE 1 TABLET DAILY  . [DISCONTINUED] carvedilol (COREG) 6.25 MG tablet TAKE 1 TABLET TWICE DAILY  WITH MEALS  . indomethacin (INDOCIN) 50 MG capsule Take 1 capsule (50 mg total) by mouth 2 (two) times daily with a meal. Only for gout flare (Patient not taking: Reported on 08/24/2017)   No facility-administered encounter medications on file as of 08/24/2017.     Allergies (verified) Patient has no known allergies.   History: Past Medical History:  Diagnosis Date  . Arthritis   . Diabetes mellitus without complication ([place])   . Hyperlipidemia   . Hypertension    Past Surgical History:    Procedure Laterality Date  . APPENDECTOMY     Family History  Problem Relation Age of Onset  . Heart disease Mother    Social History   Socioeconomic History  . Marital status: Divorced    Spouse name: None  . Number of children: 2  . Years of education: None  . Highest education level: High school graduate  Social Needs  . Financial resource strain: Not hard at all  . Food insecurity - worry: Never true  . Food insecurity - inability: Never true  . Transportation needs - medical: No  . Transportation needs - non-medical: No  Occupational History  . None  Tobacco Use  . Smoking status: Former Smoker    Packs/day: 0.25    Years: 50.00    Pack years: 12.50    Types: Cigarettes    Last attempt to quit: 03/18/2016    Years since quitting: 1.4  . Smokeless tobacco: Never Used  Substance and Sexual Activity  . Alcohol use: Yes    Alcohol/week: 0.0 oz    Comment: 4 beers once a week   . Drug use: No  . Sexual activity: None  Other Topics Concern  . None  Social History Narrative  . None   Tobacco Counseling Counseling given: Not Answered   Clinical Intake:  Pre-visit preparation completed: Yes  Pain : 0-10 Pain Score: 3  Pain Type: Chronic pain Pain Location: Knee Pain Orientation: Left, Right Pain Descriptors / Indicators:  Aching Pain Onset: More than a month ago Pain Frequency: Intermittent     Nutritional Status: BMI > 30  Obese Nutritional Risks: None Diabetes: Yes CBG done?: No Did pt. bring in CBG monitor from home?: No  How often do you need to have someone help you when you read instructions, pamphlets, or other written materials from your doctor or pharmacy?: 1 - Never What is the last grade level you completed in school?: 12th grade  Interpreter Needed?: No  Information entered by :: Andrez Grime, LPN  Activities of Daily Living In your present state of health, do you have any difficulty performing the following activities: 08/24/2017   Hearing? N  Vision? N  Difficulty concentrating or making decisions? N  Walking or climbing stairs? N  Dressing or bathing? N  Doing errands, shopping? N  Preparing Food and eating ? N  Using the Toilet? N  In the past six months, have you accidently leaked urine? N  Do you have problems with loss of bowel control? N  Managing your Medications? N  Managing your Finances? N  Housekeeping or managing your Housekeeping? N  Some recent data might be hidden     Immunizations and Health Maintenance Immunization History  Administered Date(s) Administered  . Influenza Split 04/12/2013  . Influenza,inj,Quad PF,6+ Mos 05/25/2016  . Influenza-Unspecified 04/10/2014, 04/20/2015  . Pneumococcal Conjugate-13 05/01/2014  . Pneumococcal Polysaccharide-23 02/04/2016  . Tdap 02/15/2017   Health Maintenance Due  Topic Date Due  . FOOT EXAM  10/14/2016  . OPHTHALMOLOGY EXAM  03/01/2017  . HEMOGLOBIN A1C  08/18/2017    Patient Care Team: Forrest Moron, MD as PCP - General (Internal Medicine) Adrian Prows, MD as Consulting Physician (Cardiology) Warden Fillers, MD as Consulting Physician (Ophthalmology)  Indicate any recent Medical Services you may have received from other than [facility] providers in the past year (date may be approximate).    Assessment:   This is a routine wellness examination for Christopher Bauer.  Hearing/Vision screen Hearing Screening Comments: Patient had a hearing exam completed through his employer Vision Screening Comments: Patient sees Dr. Katy Fitch once a year for his eye exams.   Dietary issues and exercise activities discussed: Current Exercise Habits: The patient does not participate in regular exercise at present, Exercise limited by: None identified  Goals    . Weight (lb) < 220 lb (99.8 kg)     Patient states that he would like to lose weight and get to around 220 lbs in the future.       Depression Screen PHQ 2/9 Scores 08/24/2017 02/15/2017 08/24/2016 05/25/2016   PHQ - 2 Score 0 0 0 0    Fall Risk Fall Risk  08/24/2017 02/15/2017 08/24/2016 05/25/2016 02/04/2016  Falls in the past year? No No No No No    Is the patient's home free of loose throw rugs in walkways, pet beds, electrical cords, etc?   yes      Grab bars in the bathroom? no      Handrails on the stairs?   no      Adequate lighting?   yes  Timed Get Up and Go performed: yes, completed within 30 seconds   Cognitive Function:     6CIT Screen 08/24/2017  What Year? 0 points  What month? 0 points  What time? 0 points  Count back from 20 0 points  Months in reverse 4 points  Repeat phrase 2 points  Total Score 6    Screening Tests Health Maintenance  Topic Date Due  . FOOT EXAM  10/14/2016  . OPHTHALMOLOGY EXAM  03/01/2017  . HEMOGLOBIN A1C  08/18/2017  . COLONOSCOPY  08/24/2018 (Originally 06/08/2016)  . TETANUS/TDAP  02/16/2027  . INFLUENZA VACCINE  Completed  . Hepatitis C Screening  Completed  . PNA vac Low Risk Adult  Completed    Qualifies for Shingles Vaccine? Advised patient to check with his pharmacy about receiving the Shingrix vaccine  Cancer Screenings: Lung: Low Dose CT Chest recommended if Age 52-80 years, 30 pack-year currently smoking OR have quit w/in 15years. Patient does qualify. Patient states that he completed a lung cancer screening through his cardiologist last year. He will have them to send [place] a copy of the report. Colorectal: declined  Additional Screenings:  Hepatitis B/HIV/Syphillis: not indicated  Hepatitis C Screening: completed 10/15/2015      Plan:   I have personally reviewed and noted the following in the patient's chart:   . Medical and social history . Use of alcohol, tobacco or illicit drugs  . Current medications and supplements . Functional ability and status . Nutritional status . Physical activity . Advanced directives . List of other physicians . Hospitalizations, surgeries, and ER visits in previous 12  months . Vitals . Screenings to include cognitive, depression, and falls . Referrals and appointments  In addition, I have reviewed and discussed with patient certain preventive protocols, quality metrics, and best practice recommendations. A written personalized care plan for preventive services as well as general preventive health recommendations were provided to patient.   1. Pure hypercholesterolemia - Lipid panel - Comprehensive metabolic panel - CBC with Differential/Platelet  2. Diabetes mellitus without complication (HCC) - Hemoglobin A1c - Microalbumin / creatinine urine ratio - Urinalysis, Complete  3. Encounter for Medicare annual wellness exam    Andrez Grime, LPN   10/10/270

## 2017-08-24 NOTE — Patient Instructions (Addendum)
Christopher Bauer , Thank you for taking time to come for your Medicare Wellness Visit. I appreciate your ongoing commitment to your health goals. Please review the following plan we discussed and let me know if I can assist you in the future.   Screening recommendations/referrals: Colonoscopy: declined Recommended yearly ophthalmology/optometry visit for glaucoma screening and checkup Recommended yearly dental visit for hygiene and checkup  Vaccinations: Influenza vaccine: up to date, You state you received this through your employer Pneumococcal vaccine: up to date Tdap vaccine: up to date, next due 02/16/2027 Shingles vaccine: Check with your pharmacy about receiving the Shingrix vaccine     Advanced directives: Advance directive discussed with you today. I have provided a copy for you to complete at home and have notarized. Once this is complete please bring a copy in to our office so we can scan it into your chart.  Conditions/risks identified: Try to lose weight and get to around 220 lbs in the future.  Next appointment: today @ 10 am with Dr. Nolon Rod, next AWV in 1 year   Preventive Care 39 Years and Older, Male Preventive care refers to lifestyle choices and visits with your health care provider that can promote health and wellness. What does preventive care include?  A yearly physical exam. This is also called an annual well check.  Dental exams once or twice a year.  Routine eye exams. Ask your health care provider how often you should have your eyes checked.  Personal lifestyle choices, including:  Daily care of your teeth and gums.  Regular physical activity.  Eating a healthy diet.  Avoiding tobacco and drug use.  Limiting alcohol use.  Practicing safe sex.  Taking low doses of aspirin every day.  Taking vitamin and mineral supplements as recommended by your health care provider. What happens during an annual well check? The services and screenings done by your  health care provider during your annual well check will depend on your age, overall health, lifestyle risk factors, and family history of disease. Counseling  Your health care provider may ask you questions about your:  Alcohol use.  Tobacco use.  Drug use.  Emotional well-being.  Home and relationship well-being.  Sexual activity.  Eating habits.  History of falls.  Memory and ability to understand (cognition).  Work and work Statistician. Screening  You may have the following tests or measurements:  Height, weight, and BMI.  Blood pressure.  Lipid and cholesterol levels. These may be checked every 5 years, or more frequently if you are over 38 years old.  Skin check.  Lung cancer screening. You may have this screening every year starting at age 57 if you have a 30-pack-year history of smoking and currently smoke or have quit within the past 15 years.  Fecal occult blood test (FOBT) of the stool. You may have this test every year starting at age 71.  Flexible sigmoidoscopy or colonoscopy. You may have a sigmoidoscopy every 5 years or a colonoscopy every 10 years starting at age 47.  Prostate cancer screening. Recommendations will vary depending on your family history and other risks.  Hepatitis C blood test.  Hepatitis B blood test.  Sexually transmitted disease (STD) testing.  Diabetes screening. This is done by checking your blood sugar (glucose) after you have not eaten for a while (fasting). You may have this done every 1-3 years.  Abdominal aortic aneurysm (AAA) screening. You may need this if you are a current or former smoker.  Osteoporosis. You  may be screened starting at age 38 if you are at high risk. Talk with your health care provider about your test results, treatment options, and if necessary, the need for more tests. Vaccines  Your health care provider may recommend certain vaccines, such as:  Influenza vaccine. This is recommended every  year.  Tetanus, diphtheria, and acellular pertussis (Tdap, Td) vaccine. You may need a Td booster every 10 years.  Zoster vaccine. You may need this after age 29.  Pneumococcal 13-valent conjugate (PCV13) vaccine. One dose is recommended after age 4.  Pneumococcal polysaccharide (PPSV23) vaccine. One dose is recommended after age 68. Talk to your health care provider about which screenings and vaccines you need and how often you need them. This information is not intended to replace advice given to you by your health care provider. Make sure you discuss any questions you have with your health care provider. Document Released: 07/24/2015 Document Revised: 03/16/2016 Document Reviewed: 04/28/2015 Elsevier Interactive Patient Education  2017 [place] Prevention in the Home Falls can cause injuries. They can happen to people of all ages. There are many things you can do to make your home safe and to help prevent falls. What can I do on the outside of my home?  Regularly fix the edges of walkways and driveways and fix any cracks.  Remove anything that might make you trip as you walk through a door, such as a raised step or threshold.  Trim any bushes or trees on the path to your home.  Use bright outdoor lighting.  Clear any walking paths of anything that might make someone trip, such as rocks or tools.  Regularly check to see if handrails are loose or broken. Make sure that both sides of any steps have handrails.  Any raised decks and porches should have guardrails on the edges.  Have any leaves, snow, or ice cleared regularly.  Use sand or salt on walking paths during winter.  Clean up any spills in your garage right away. This includes oil or grease spills. What can I do in the bathroom?  Use night lights.  Install grab bars by the toilet and in the tub and shower. Do not use towel bars as grab bars.  Use non-skid mats or decals in the tub or shower.  If you  need to sit down in the shower, use a plastic, non-slip stool.  Keep the floor dry. Clean up any water that spills on the floor as soon as it happens.  Remove soap buildup in the tub or shower regularly.  Attach bath mats securely with double-sided non-slip rug tape.  Do not have throw rugs and other things on the floor that can make you trip. What can I do in the bedroom?  Use night lights.  Make sure that you have a light by your bed that is easy to reach.  Do not use any sheets or blankets that are too big for your bed. They should not hang down onto the floor.  Have a firm chair that has side arms. You can use this for support while you get dressed.  Do not have throw rugs and other things on the floor that can make you trip. What can I do in the kitchen?  Clean up any spills right away.  Avoid walking on wet floors.  Keep items that you use a lot in easy-to-reach places.  If you need to reach something above you, use a strong step stool that  has a grab bar.  Keep electrical cords out of the way.  Do not use floor polish or wax that makes floors slippery. If you must use wax, use non-skid floor wax.  Do not have throw rugs and other things on the floor that can make you trip. What can I do with my stairs?  Do not leave any items on the stairs.  Make sure that there are handrails on both sides of the stairs and use them. Fix handrails that are broken or loose. Make sure that handrails are as long as the stairways.  Check any carpeting to make sure that it is firmly attached to the stairs. Fix any carpet that is loose or worn.  Avoid having throw rugs at the top or bottom of the stairs. If you do have throw rugs, attach them to the floor with carpet tape.  Make sure that you have a light switch at the top of the stairs and the bottom of the stairs. If you do not have them, ask someone to add them for you. What else can I do to help prevent falls?  Wear shoes  that:  Do not have high heels.  Have rubber bottoms.  Are comfortable and fit you well.  Are closed at the toe. Do not wear sandals.  If you use a stepladder:  Make sure that it is fully opened. Do not climb a closed stepladder.  Make sure that both sides of the stepladder are locked into place.  Ask someone to hold it for you, if possible.  Clearly mark and make sure that you can see:  Any grab bars or handrails.  First and last steps.  Where the edge of each step is.  Use tools that help you move around (mobility aids) if they are needed. These include:  Canes.  Walkers.  Scooters.  Crutches.  Turn on the lights when you go into a dark area. Replace any light bulbs as soon as they burn out.  Set up your furniture so you have a clear path. Avoid moving your furniture around.  If any of your floors are uneven, fix them.  If there are any pets around you, be aware of where they are.  Review your medicines with your doctor. Some medicines can make you feel dizzy. This can increase your chance of falling. Ask your doctor what other things that you can do to help prevent falls. This information is not intended to replace advice given to you by your health care provider. Make sure you discuss any questions you have with your health care provider. Document Released: 04/23/2009 Document Revised: 12/03/2015 Document Reviewed: 08/01/2014 Elsevier Interactive Patient Education  2017 [employer].

## 2017-08-24 NOTE — Patient Instructions (Addendum)
  Call your orthopedic surgeon about your knees  Tylenol for pain    IF you received an x-ray today, you will receive an invoice from [facility]. Please contact [facility] at [phone] with questions or concerns regarding your invoice.   IF you received labwork today, you will receive an invoice from [facility]. Please contact [facility] at [phone] with questions or concerns regarding your invoice.   Our billing staff will not be able to assist you with questions regarding bills from these companies.  You will be contacted with the lab results as soon as they are available. The fastest way to get your results is to activate your My Chart account. Instructions are located on the last page of this paperwork. If you have not heard from [place] regarding the results in 2 weeks, please contact this office.

## 2017-08-24 NOTE — Progress Notes (Signed)
Chief Complaint  Patient presents with  . Diabetes    6 MONTHS FOLLOW UP    HPI   Diabetes Mellitus: Patient presents for follow up of diabetes. Symptoms: none. Symptoms have stabilized. Patient denies foot ulcerations, hyperglycemia, hypoglycemia , increase appetite, nausea, paresthesia of the feet, polydipsia, polyuria and visual disturbances.  Evaluation to date has been included: hemoglobin A1C.  Home sugars: patient does not check sugars. Treatment to date: Continued metformin which has been effective.  Lab Results  Component Value Date   HGBA1C 6.2 (H) 02/15/2017   Arthritis Pt reports that [facility] injected both knees a year ago  He takes indomethacin for his gout flares and thinks he might be having early gout attack When he walks his right hip joint is extremely painful He states that for his daily arthritis he typically takes meloxicam daily for his arthritis If he feels like he is having a gouty attacks he takes indomethacin and stops the meloxicam His pain is 4/10 and is tolerable   Hypertension: Patient here for follow-up of elevated blood pressure. He is not exercising and is adherent to low salt diet.  Blood pressure is well controlled at home. Cardiac symptoms none. Patient denies chest pain, chest pressure/discomfort, dyspnea, exertional chest pressure/discomfort, fatigue, irregular heart beat and lower extremity edema.  Cardiovascular risk factors: advanced age (older than 81 for men, 63 for women), diabetes mellitus, dyslipidemia, hypertension and male gender. Use of agents associated with hypertension: NSAIDS. History of target organ damage: none. BP Readings from Last 3 Encounters:  08/24/17 126/82  08/24/17 126/82  02/15/17 116/72      Past Medical History:  Diagnosis Date  . Arthritis   . Diabetes mellitus without complication ([place])   . Hyperlipidemia   . Hypertension     Current Outpatient Medications  Medication Sig Dispense Refill  .  aspirin 81 MG tablet Take 81 mg by mouth daily.    Marland Kitchen atorvastatin (LIPITOR) 40 MG tablet Take 1 tablet (40 mg total) by mouth daily. 90 tablet 3  . carvedilol (COREG) 6.25 MG tablet TAKE 1 TABLET TWICE DAILY  WITH MEALS 180 tablet 3  . indomethacin (INDOCIN) 50 MG capsule Take 1 capsule (50 mg total) by mouth 2 (two) times daily with a meal. Only for gout flare 60 capsule 0  . lisinopril-hydrochlorothiazide (PRINZIDE,ZESTORETIC) 20-12.5 MG tablet Take 1 tablet by mouth daily. 90 tablet 3  . meloxicam (MOBIC) 15 MG tablet TAKE 1 TABLET DAILY AS     NEEDED FOR PAIN 90 tablet 3  . metFORMIN (GLUCOPHAGE) 1000 MG tablet TAKE 1 TABLET TWICE DAILY  WITH MEALS 180 tablet 1   No current facility-administered medications for this visit.     Allergies: No Known Allergies  Past Surgical History:  Procedure Laterality Date  . APPENDECTOMY      Social History   Socioeconomic History  . Marital status: Divorced    Spouse name: None  . Number of children: 2  . Years of education: None  . Highest education level: High school graduate  Social Needs  . Financial resource strain: Not hard at all  . Food insecurity - worry: Never true  . Food insecurity - inability: Never true  . Transportation needs - medical: No  . Transportation needs - non-medical: No  Occupational History  . None  Tobacco Use  . Smoking status: Former Smoker    Packs/day: 0.25    Years: 50.00    Pack years: 12.50    Types:  Cigarettes    Last attempt to quit: 03/18/2016    Years since quitting: 1.4  . Smokeless tobacco: Never Used  Substance and Sexual Activity  . Alcohol use: Yes    Alcohol/week: 0.0 oz    Comment: 4 beers once a week   . Drug use: No  . Sexual activity: None  Other Topics Concern  . None  Social History Narrative  . None    Family History  Problem Relation Age of Onset  . Heart disease Mother      ROS Review of Systems See HPI Constitution: No fevers or chills No malaise No  diaphoresis Skin: No rash or itching Eyes: no blurry vision, no double vision GU: no dysuria or hematuria Neuro: no dizziness or headaches all others reviewed and negative   Objective: Vitals:   08/24/17 1004  BP: 126/82  Pulse: 84  Resp: 16  Temp: 98 F (36.7 C)  TempSrc: Oral  SpO2: 96%  Weight: 232 lb 8 oz (105.5 kg)  Height: 6' (1.829 m)    Physical Exam  Constitutional: He is oriented to person, place, and time. He appears well-developed and well-nourished.  HENT:  Head: Normocephalic and atraumatic.  Right Ear: External ear normal.  Left Ear: External ear normal.  Mouth/Throat: Oropharynx is clear and moist.  Eyes: Conjunctivae and EOM are normal.  Cardiovascular: Normal rate, regular rhythm, normal heart sounds and intact distal pulses.  No murmur heard. Pulmonary/Chest: Effort normal and breath sounds normal. No stridor. No respiratory distress. He has no wheezes.  Musculoskeletal:       Right knee: He exhibits effusion, deformity and bony tenderness. Tenderness found. Medial joint line tenderness noted.       Left knee: He exhibits effusion, deformity and bony tenderness. Tenderness found. Medial joint line tenderness noted.  Neurological: He is alert and oriented to person, place, and time.  Psychiatric: He has a normal mood and affect. His behavior is normal. Judgment and thought content normal.    Assessment and Plan Christopher Bauer was seen today for diabetes.  Diagnoses and all orders for this visit:  Diabetes mellitus type 2 in obese ([place])- well controlled with current diet and meds  Coronary artery disease involving coronary bypass graft of native heart without angina pectoris  Mixed hyperlipidemia- will refill lipitor Lipid panel pending  Polyarthralgia- advised to contact Orthopedics about possible knee replacement I am concerned about the quantity of nsaid consumption  The underlying problem needs to be addressed  Essential hypertension- bp at goal,  refilled meds today  Other orders -     lisinopril-hydrochlorothiazide (PRINZIDE,ZESTORETIC) 20-12.5 MG tablet; Take 1 tablet by mouth daily. -     atorvastatin (LIPITOR) 40 MG tablet; Take 1 tablet (40 mg total) by mouth daily.     [place]

## 2017-08-25 LAB — CBC WITH DIFFERENTIAL/PLATELET
BASOS ABS: 0 10*3/uL (ref 0.0–0.2)
Basos: 0 %
EOS (ABSOLUTE): 0.2 10*3/uL (ref 0.0–0.4)
Eos: 2 %
Hematocrit: 41.6 % (ref 37.5–51.0)
Hemoglobin: 14.3 g/dL (ref 13.0–17.7)
IMMATURE GRANS (ABS): 0 10*3/uL (ref 0.0–0.1)
IMMATURE GRANULOCYTES: 0 %
LYMPHS: 27 %
Lymphocytes Absolute: 2.9 10*3/uL (ref 0.7–3.1)
MCH: 30.4 pg (ref 26.6–33.0)
MCHC: 34.4 g/dL (ref 31.5–35.7)
MCV: 89 fL (ref 79–97)
MONOCYTES: 7 %
Monocytes Absolute: 0.8 10*3/uL (ref 0.1–0.9)
NEUTROS ABS: 7 10*3/uL (ref 1.4–7.0)
Neutrophils: 64 %
PLATELETS: 399 10*3/uL — AB (ref 150–379)
RBC: 4.7 x10E6/uL (ref 4.14–5.80)
RDW: 13.5 % (ref 12.3–15.4)
WBC: 10.9 10*3/uL — ABNORMAL HIGH (ref 3.4–10.8)

## 2017-08-25 LAB — URINALYSIS, COMPLETE
Bilirubin, UA: NEGATIVE
GLUCOSE, UA: NEGATIVE
Ketones, UA: NEGATIVE
LEUKOCYTES UA: NEGATIVE
NITRITE UA: NEGATIVE
PH UA: 6.5 (ref 5.0–7.5)
PROTEIN UA: NEGATIVE
RBC, UA: NEGATIVE
Specific Gravity, UA: 1.026 (ref 1.005–1.030)
UUROB: 1 mg/dL (ref 0.2–1.0)

## 2017-08-25 LAB — MICROSCOPIC EXAMINATION
Bacteria, UA: NONE SEEN
CASTS: NONE SEEN /LPF

## 2017-08-25 LAB — COMPREHENSIVE METABOLIC PANEL
ALT: 31 IU/L (ref 0–44)
AST: 28 IU/L (ref 0–40)
Albumin/Globulin Ratio: 2 (ref 1.2–2.2)
Albumin: 4.3 g/dL (ref 3.5–4.8)
Alkaline Phosphatase: 105 IU/L (ref 39–117)
BUN/Creatinine Ratio: 25 — ABNORMAL HIGH (ref 10–24)
BUN: 15 mg/dL (ref 8–27)
Bilirubin Total: 0.9 mg/dL (ref 0.0–1.2)
CALCIUM: 10 mg/dL (ref 8.6–10.2)
CHLORIDE: 100 mmol/L (ref 96–106)
CO2: 22 mmol/L (ref 20–29)
Creatinine, Ser: 0.59 mg/dL — ABNORMAL LOW (ref 0.76–1.27)
GFR, EST AFRICAN AMERICAN: 117 mL/min/{1.73_m2} (ref >59)
GFR, EST NON AFRICAN AMERICAN: 101 mL/min/{1.73_m2} (ref >59)
GLUCOSE: 138 mg/dL — AB (ref 65–99)
Globulin, Total: 2.1 g/dL (ref 1.5–4.5)
Potassium: 4.4 mmol/L (ref 3.5–5.2)
Sodium: 141 mmol/L (ref 134–144)
TOTAL PROTEIN: 6.4 g/dL (ref 6.0–8.5)

## 2017-08-25 LAB — MICROALBUMIN / CREATININE URINE RATIO
CREATININE, UR: 109 mg/dL
MICROALBUM., U, RANDOM: 28.3 ug/mL
Microalb/Creat Ratio: 26 mg/g creat (ref 0.0–30.0)

## 2017-08-25 LAB — LIPID PANEL
CHOL/HDL RATIO: 3.3 ratio (ref 0.0–5.0)
Cholesterol, Total: 140 mg/dL (ref 100–199)
HDL: 42 mg/dL (ref >39)
LDL Calculated: 70 mg/dL (ref 0–99)
Triglycerides: 142 mg/dL (ref 0–149)
VLDL Cholesterol Cal: 28 mg/dL (ref 5–40)

## 2017-08-25 LAB — HEMOGLOBIN A1C
ESTIMATED AVERAGE GLUCOSE: 169 mg/dL
HEMOGLOBIN A1C: 7.5 % — AB (ref 4.8–5.6)

## 2017-11-15 ENCOUNTER — Other Ambulatory Visit: Payer: Self-pay | Admitting: Family Medicine

## 2017-11-28 ENCOUNTER — Other Ambulatory Visit: Payer: Self-pay | Admitting: *Deleted

## 2017-11-28 MED ORDER — METFORMIN HCL 1000 MG PO TABS: 1000.0000 mg | ORAL_TABLET | Freq: Two times a day (BID) | ORAL | 0 refills | Status: DC

## 2018-02-21 ENCOUNTER — Other Ambulatory Visit: Payer: Self-pay | Admitting: Family Medicine

## 2018-03-03 ENCOUNTER — Ambulatory Visit (INDEPENDENT_AMBULATORY_CARE_PROVIDER_SITE_OTHER): Payer: Medicare HMO | Admitting: Family Medicine

## 2018-03-03 ENCOUNTER — Ambulatory Visit (INDEPENDENT_AMBULATORY_CARE_PROVIDER_SITE_OTHER): Payer: Medicare HMO

## 2018-03-03 ENCOUNTER — Encounter: Payer: Self-pay | Admitting: Family Medicine

## 2018-03-03 VITALS — BP 112/66 | HR 73 | Temp 98.7°F | Resp 16 | Ht 72.0 in | Wt 228.4 lb

## 2018-03-03 DIAGNOSIS — E119 Type 2 diabetes mellitus without complications: Secondary | ICD-10-CM

## 2018-03-03 DIAGNOSIS — M170 Bilateral primary osteoarthritis of knee: Secondary | ICD-10-CM | POA: Diagnosis not present

## 2018-03-03 DIAGNOSIS — I10 Essential (primary) hypertension: Secondary | ICD-10-CM | POA: Diagnosis not present

## 2018-03-03 DIAGNOSIS — M25551 Pain in right hip: Secondary | ICD-10-CM

## 2018-03-03 DIAGNOSIS — I25810 Atherosclerosis of coronary artery bypass graft(s) without angina pectoris: Secondary | ICD-10-CM | POA: Diagnosis not present

## 2018-03-03 DIAGNOSIS — G629 Polyneuropathy, unspecified: Secondary | ICD-10-CM

## 2018-03-03 MED ORDER — GABAPENTIN 300 MG PO CAPS: 300.0000 mg | ORAL_CAPSULE | Freq: Three times a day (TID) | ORAL | 3 refills | Status: DC

## 2018-03-03 MED ORDER — METFORMIN HCL 1000 MG PO TABS: 1000.0000 mg | ORAL_TABLET | Freq: Two times a day (BID) | ORAL | 0 refills | Status: DC

## 2018-03-03 NOTE — Progress Notes (Signed)
Chief Complaint  Patient presents with  . Hypertension    6 month follow up  . Diabetes    follow up; states he does not need an refills as of now    HPI   Diabetes Mellitus: Patient presents for follow up of diabetes. Symptoms: paresthesia of fingers. Symptoms have gradually worsened. Patient denies hypoglycemia .  Evaluation to date has been included: hemoglobin A1C.  Home sugars: patient does not check sugars.  Lab Results  Component Value Date   HGBA1C 7.5 (H) 08/24/2017   He denies hypoglycemia He takes metformin bid Wt Readings from Last 3 Encounters:  03/03/18 228 lb 6.4 oz (103.6 kg)  08/24/17 232 lb 8 oz (105.5 kg)  08/24/17 232 lb 8 oz (105.5 kg)   Lab Results  Component Value Date   CREATININE 0.59 (L) 08/24/2017   Numbness Pt reports that he has been having numbness and tingling of the left hand The right had has been operated on due to carpal tunnel and the numbness resolved Now the left hand is getting worse but does not feel like carpal tunnel He would like to try a medication before going to the specialists He reports that meloxicam does not help it much  Right hip pain He fell while he was at the [employer] and tried a stationary bike He states that he fell off the bike while dismounting This happened 3 months ago He takes meloxicam 15mg  for arthritis He drives and delivers vehicles  Hypertension: Patient here for follow-up of elevated blood pressure. He is only doing very little exercising and is adherent to low salt diet.  Blood pressure is well controlled at home. Cardiac symptoms none. Patient denies chest pain and chest pressure/discomfort.  Cardiovascular risk factors: diabetes mellitus, dyslipidemia, hypertension, male gender and sedentary lifestyle. Use of agents associated with hypertension: NSAIDS.  BP Readings from Last 3 Encounters:  03/03/18 112/66  08/24/17 126/82  08/24/17 126/82    Coronary Artery Disease: Patient presents for  routine coronary artery disease follow-up.  Current symptoms: non-existent Pain radiation: none Patient denies shortness of breath, diaphoresis, nausea and heartburn.   Lab Results  Component Value Date   CHOL 140 08/24/2017   CHOL 159 08/24/2016   CHOL 150 05/25/2016   Lab Results  Component Value Date   HDL 42 08/24/2017   HDL 43 08/24/2016   HDL 45 05/25/2016   Lab Results  Component Value Date   LDLCALC 70 08/24/2017   LDLCALC 87 08/24/2016   LDLCALC 75 05/25/2016   Lab Results  Component Value Date   TRIG 142 08/24/2017   TRIG 144 08/24/2016   TRIG 151 (H) 05/25/2016   Lab Results  Component Value Date   CHOLHDL 3.3 08/24/2017   CHOLHDL 3.7 08/24/2016   CHOLHDL 3.3 05/25/2016   No results found for: LDLDIRECT   Past Medical History:  Diagnosis Date  . Arthritis   . Diabetes mellitus without complication ([place])   . Hyperlipidemia   . Hypertension     Current Outpatient Medications  Medication Sig Dispense Refill  . aspirin 81 MG tablet Take 81 mg by mouth daily.    Marland Kitchen atorvastatin (LIPITOR) 40 MG tablet Take 1 tablet (40 mg total) by mouth daily. 90 tablet 3  . carvedilol (COREG) 6.25 MG tablet TAKE 1 TABLET TWICE DAILY  WITH MEALS 180 tablet 3  . lisinopril-hydrochlorothiazide (PRINZIDE,ZESTORETIC) 20-12.5 MG tablet Take 1 tablet by mouth daily. 90 tablet 3  . meloxicam (MOBIC) 15  MG tablet TAKE 1 TABLET DAILY AS     NEEDED FOR PAIN 90 tablet 3  . metFORMIN (GLUCOPHAGE) 1000 MG tablet TAKE 1 TABLET TWICE DAILY  WITH MEALS 180 tablet 0  . gabapentin (NEURONTIN) 300 MG capsule Take 1 capsule (300 mg total) by mouth 3 (three) times daily. 90 capsule 3   No current facility-administered medications for this visit.     Allergies: No Known Allergies  Past Surgical History:  Procedure Laterality Date  . APPENDECTOMY      Social History   Socioeconomic History  . Marital status: Divorced    Spouse name: Not on file  . Number of children: 2  . Years of  education: Not on file  . Highest education level: High school graduate  Occupational History  . Not on file  Social Needs  . Financial resource strain: Not hard at all  . Food insecurity:    Worry: Never true    Inability: Never true  . Transportation needs:    Medical: No    Non-medical: No  Tobacco Use  . Smoking status: Former Smoker    Packs/day: 0.25    Years: 50.00    Pack years: 12.50    Types: Cigarettes    Last attempt to quit: 03/18/2016    Years since quitting: 1.9  . Smokeless tobacco: Never Used  Substance and Sexual Activity  . Alcohol use: Yes    Alcohol/week: 0.0 standard drinks    Comment: 4 beers once a week   . Drug use: No  . Sexual activity: Not on file  Lifestyle  . Physical activity:    Days per week: 0 days    Minutes per session: 0 min  . Stress: Not at all  Relationships  . Social connections:    Talks on phone: More than three times a week    Gets together: More than three times a week    Attends religious service: Never    Active member of club or organization: No    Attends meetings of clubs or organizations: Never    Relationship status: Divorced  Other Topics Concern  . Not on file  Social History Narrative  . Not on file    Family History  Problem Relation Age of Onset  . Heart disease Mother      ROS Review of Systems See HPI Constitution: No fevers or chills No malaise No diaphoresis Skin: No rash or itching Eyes: no blurry vision, no double vision GU: no dysuria or hematuria Neuro: no dizziness or headaches all others reviewed and negative   Objective: Vitals:   03/03/18 1141  BP: 112/66  Pulse: 73  Resp: 16  Temp: 98.7 F (37.1 C)  TempSrc: Oral  SpO2: 96%  Weight: 228 lb 6.4 oz (103.6 kg)  Height: 6' (1.829 m)   Diabetic Foot Exam - Simple   Simple Foot Form Diabetic Foot exam was performed with the following findings:  Yes 03/03/2018 12:32 PM  Visual Inspection No deformities, no ulcerations, no other  skin breakdown bilaterally:  Yes Sensation Testing Intact to touch and monofilament testing bilaterally:  Yes Pulse Check Posterior Tibialis and Dorsalis pulse intact bilaterally:  Yes Comments     Physical Exam  Constitutional: He is oriented to person, place, and time. He appears well-developed and well-nourished.  HENT:  Head: Normocephalic and atraumatic.  Eyes: Conjunctivae and EOM are normal.  Neck: Normal range of motion. Neck supple. No thyromegaly present.  Cardiovascular: Normal rate, regular  rhythm and normal heart sounds.  No murmur heard. Pulmonary/Chest: Effort normal and breath sounds normal. No stridor. No respiratory distress. He has no wheezes.  Abdominal: Soft. Bowel sounds are normal. He exhibits no distension and no mass. There is no tenderness. There is no guarding.  Musculoskeletal: Normal range of motion. He exhibits no edema.       Right hip: He exhibits tenderness. He exhibits normal range of motion, normal strength, no bony tenderness, no swelling, no crepitus, no deformity and no laceration.       Left hip: Normal. He exhibits normal range of motion, normal strength, no tenderness and no bony tenderness.  Chronic OA deformity of the knees bilaterally   Neurological: He is alert and oriented to person, place, and time.  Skin: Skin is warm. Capillary refill takes less than 2 seconds.  Psychiatric: He has a normal mood and affect. His behavior is normal. Judgment and thought content normal.     CLINICAL DATA:  Right hip pain  EXAM: DG HIP (WITH OR WITHOUT PELVIS) 2-3V RIGHT  COMPARISON:  None.  FINDINGS: No pelvic fracture. No joint space narrowing of either hip joint. No sign of avascular necrosis. Sacroiliac joints show bridging osteophytes. Symphysis pubis is normal. There is calcification of the tendinous structures in the region.  IMPRESSION: No focal bone finding. No evidence of hip arthritis. Tendons throughout the region show  calcification. This can be seen in various enthesopathies.   Electronically Signed   By: Nelson Chimes M.D.   On: 03/03/2018 12:44   Assessment and Plan Christopher Bauer was seen today for hypertension and diabetes.  Diagnoses and all orders for this visit:  Diabetes mellitus without complication ([place]) well controlled hemoglobin a1c is at goal Continue exercise Lipids monitored and renal function in range On metformin On ace or arb On asa 81mg  Reviewed diabetic foot care Emphasized importance of eye and dental exam    -     HM Diabetes Foot Exam -     Hemoglobin A1c  Coronary artery disease involving coronary bypass graft of native heart without angina pectoris- continue current meds and healthy lifestyle -     Lipid panel  Primary osteoarthritis of both knees- continue ambulation and nsaids prn  Essential hypertension- Patient's blood pressure is at goal of 139/89 or less. Condition is stable. Continue current medications and treatment plan. I recommend that you exercise for 30-45 minutes 5 days a week. I also recommend a balanced diet with fruits and vegetables every day, lean meats, and little fried foods. The DASH diet (you can find this online) is a good example of this.  -     Comprehensive metabolic panel  Polyneuropathy- will do trial of gabapentin Pt instructedon how to taper up the dose from once a day to 2-3 times a day Common side effects discussed  -     gabapentin (NEURONTIN) 300 MG capsule; Take 1 capsule (300 mg total) by mouth 3 (three) times daily.  Right hip pain- likely due to tendon disease, no bony pathology Advised pt to continue meloxicam and add biofreeze -     DG HIP UNILAT W OR W/O PELVIS 2-3 VIEWS RIGHT; Future     Sparkle Aube A Nolon Rod

## 2018-03-03 NOTE — Patient Instructions (Addendum)
    If you have lab work done today you will be contacted with your lab results within the next 2 weeks.  If you have not heard from us then please contact us. The fastest way to get your results is to register for My Chart.   IF you received an x-ray today, you will receive an invoice from [facility]. Please contact [facility] at [phone] with questions or concerns regarding your invoice.   IF you received labwork today, you will receive an invoice from [facility]. Please contact [facility] at [phone] with questions or concerns regarding your invoice.   Our billing staff will not be able to assist you with questions regarding bills from these companies.  You will be contacted with the lab results as soon as they are available. The fastest way to get your results is to activate your My Chart account. Instructions are located on the last page of this paperwork. If you have not heard from us regarding the results in 2 weeks, please contact this office.      Hip Pain The hip is the joint between the upper legs and the lower pelvis. The bones, cartilage, tendons, and muscles of your hip joint support your body and allow you to move around. Hip pain can range from a minor ache to severe pain in one or both of your hips. The pain may be felt on the inside of the hip joint near the groin, or the outside near the buttocks and upper thigh. You may also have swelling or stiffness. Follow these instructions at home: Managing pain, stiffness, and swelling  If directed, apply ice to the injured area. ? Put ice in a plastic bag. ? Place a towel between your skin and the bag. ? Leave the ice on for 20 minutes, 2-3 times a day  Sleep with a pillow between your legs on your most comfortable side.  Avoid any activities that cause pain. General instructions  Take over-the-counter and prescription medicines only as told by your health care provider.  Do any exercises as told  by your health care provider.  Record the following: ? How often you have hip pain. ? The location of your pain. ? What the pain feels like. ? What makes the pain worse.  Keep all follow-up visits as told by your health care provider. This is important. Contact a health care provider if:  You cannot put weight on your leg.  Your pain or swelling continues or gets worse after one week.  It gets harder to walk.  You have a fever. Get help right away if:  You fall.  You have a sudden increase in pain and swelling in your hip.  Your hip is red or swollen or very tender to touch. Summary  Hip pain can range from a minor ache to severe pain in one or both of your hips.  The pain may be felt on the inside of the hip joint near the groin, or the outside near the buttocks and upper thigh.  Avoid any activities that cause pain.  Record how often you have hip pain, the location of the pain, what makes it worse and what it feels like. This information is not intended to replace advice given to you by your health care provider. Make sure you discuss any questions you have with your health care provider. Document Released: 12/15/2009 Document Revised: 05/30/2016 Document Reviewed: 05/30/2016 Elsevier Interactive Patient Education  2018 [employer]  

## 2018-03-04 LAB — COMPREHENSIVE METABOLIC PANEL
ALK PHOS: 104 IU/L (ref 39–117)
ALT: 27 IU/L (ref 0–44)
AST: 21 IU/L (ref 0–40)
Albumin/Globulin Ratio: 1.8 (ref 1.2–2.2)
Albumin: 4.2 g/dL (ref 3.5–4.8)
BILIRUBIN TOTAL: 0.7 mg/dL (ref 0.0–1.2)
BUN/Creatinine Ratio: 24 (ref 10–24)
BUN: 15 mg/dL (ref 8–27)
CHLORIDE: 99 mmol/L (ref 96–106)
CO2: 25 mmol/L (ref 20–29)
Calcium: 10 mg/dL (ref 8.6–10.2)
Creatinine, Ser: 0.62 mg/dL — ABNORMAL LOW (ref 0.76–1.27)
GFR calc Af Amer: 115 mL/min/{1.73_m2} (ref >59)
GFR calc non Af Amer: 99 mL/min/{1.73_m2} (ref >59)
GLUCOSE: 128 mg/dL — AB (ref 65–99)
Globulin, Total: 2.3 g/dL (ref 1.5–4.5)
Potassium: 4.5 mmol/L (ref 3.5–5.2)
Sodium: 140 mmol/L (ref 134–144)
Total Protein: 6.5 g/dL (ref 6.0–8.5)

## 2018-03-04 LAB — HEMOGLOBIN A1C
Est. average glucose Bld gHb Est-mCnc: 157 mg/dL
Hgb A1c MFr Bld: 7.1 % — ABNORMAL HIGH (ref 4.8–5.6)

## 2018-03-04 LAB — LIPID PANEL
CHOLESTEROL TOTAL: 148 mg/dL (ref 100–199)
Chol/HDL Ratio: 3.5 ratio (ref 0.0–5.0)
HDL: 42 mg/dL (ref >39)
LDL Calculated: 78 mg/dL (ref 0–99)
Triglycerides: 141 mg/dL (ref 0–149)
VLDL CHOLESTEROL CAL: 28 mg/dL (ref 5–40)

## 2018-03-21 ENCOUNTER — Other Ambulatory Visit: Payer: Self-pay | Admitting: Family Medicine

## 2018-03-21 DIAGNOSIS — G629 Polyneuropathy, unspecified: Secondary | ICD-10-CM

## 2018-03-21 NOTE — Telephone Encounter (Signed)
Copied from [place] [phone]. Topic: General - Other >> Mar 21, 2018  3:02 PM Janace Aris A wrote: Medication: gabapentin (NEURONTIN) 300 MG capsule [[id number]]   Has the patient contacted their pharmacy? Yes  Agent: If yes, when and what did the pharmacy advise? New Pharmacy on File to use, didn't receive order.  Preferred Pharmacy (with phone number or street name): [facility] [place] [place] [facility]  [phone] (Phone) [phone] (Fax)    Agent: Please be advised that RX refills may take up to 3 business days. We ask that you follow-up with your pharmacy.

## 2018-03-21 NOTE — Telephone Encounter (Signed)
Refill of neurontin  LRF 03/03/18  #90  3 refills  LOV 03/03/18 Dr. Nolon Rod    New Pharmacy on file, didn't receive order.   [facility] [place] [place] [facility]             [phone] (Phone) [phone] (Fax)

## 2018-03-24 ENCOUNTER — Encounter: Payer: Self-pay | Admitting: Family Medicine

## 2018-03-26 ENCOUNTER — Other Ambulatory Visit: Payer: Self-pay | Admitting: *Deleted

## 2018-03-26 DIAGNOSIS — G629 Polyneuropathy, unspecified: Secondary | ICD-10-CM

## 2018-03-26 MED ORDER — GABAPENTIN 300 MG PO CAPS: 300.0000 mg | ORAL_CAPSULE | Freq: Three times a day (TID) | ORAL | 3 refills | Status: DC

## 2018-06-12 ENCOUNTER — Other Ambulatory Visit: Payer: Self-pay | Admitting: Family Medicine

## 2018-06-15 ENCOUNTER — Encounter: Payer: Self-pay | Admitting: Family Medicine

## 2018-06-15 DIAGNOSIS — H04123 Dry eye syndrome of bilateral lacrimal glands: Secondary | ICD-10-CM | POA: Diagnosis not present

## 2018-06-15 DIAGNOSIS — E119 Type 2 diabetes mellitus without complications: Secondary | ICD-10-CM | POA: Diagnosis not present

## 2018-06-15 DIAGNOSIS — H02831 Dermatochalasis of right upper eyelid: Secondary | ICD-10-CM | POA: Diagnosis not present

## 2018-06-15 DIAGNOSIS — H02834 Dermatochalasis of left upper eyelid: Secondary | ICD-10-CM | POA: Diagnosis not present

## 2018-06-15 DIAGNOSIS — H2513 Age-related nuclear cataract, bilateral: Secondary | ICD-10-CM | POA: Diagnosis not present

## 2018-06-15 DIAGNOSIS — H25813 Combined forms of age-related cataract, bilateral: Secondary | ICD-10-CM | POA: Diagnosis not present

## 2018-06-15 LAB — HM DIABETES EYE EXAM

## 2018-06-22 DIAGNOSIS — H52209 Unspecified astigmatism, unspecified eye: Secondary | ICD-10-CM | POA: Diagnosis not present

## 2018-06-22 DIAGNOSIS — H5213 Myopia, bilateral: Secondary | ICD-10-CM | POA: Diagnosis not present

## 2018-07-13 ENCOUNTER — Other Ambulatory Visit: Payer: Self-pay | Admitting: Family Medicine

## 2018-07-13 ENCOUNTER — Encounter: Payer: Self-pay | Admitting: Family Medicine

## 2018-07-13 DIAGNOSIS — R9439 Abnormal result of other cardiovascular function study: Secondary | ICD-10-CM | POA: Diagnosis not present

## 2018-07-13 DIAGNOSIS — I10 Essential (primary) hypertension: Secondary | ICD-10-CM | POA: Diagnosis not present

## 2018-07-13 DIAGNOSIS — I2584 Coronary atherosclerosis due to calcified coronary lesion: Secondary | ICD-10-CM | POA: Diagnosis not present

## 2018-07-13 DIAGNOSIS — I2510 Atherosclerotic heart disease of native coronary artery without angina pectoris: Secondary | ICD-10-CM | POA: Diagnosis not present

## 2018-07-13 MED ORDER — METFORMIN HCL 1000 MG PO TABS: 1000.0000 mg | ORAL_TABLET | Freq: Two times a day (BID) | ORAL | 0 refills | Status: DC

## 2018-07-13 MED ORDER — ATORVASTATIN CALCIUM 40 MG PO TABS: 40.0000 mg | ORAL_TABLET | Freq: Every day | ORAL | 0 refills | Status: DC

## 2018-07-13 MED ORDER — MELOXICAM 15 MG PO TABS: ORAL_TABLET | ORAL | 0 refills | Status: DC

## 2018-07-13 MED ORDER — CARVEDILOL 6.25 MG PO TABS: 6.2500 mg | ORAL_TABLET | Freq: Two times a day (BID) | ORAL | 0 refills | Status: DC

## 2018-07-13 MED ORDER — LISINOPRIL-HYDROCHLOROTHIAZIDE 20-12.5 MG PO TABS: 1.0000 | ORAL_TABLET | Freq: Every day | ORAL | 0 refills | Status: DC

## 2018-07-13 NOTE — Telephone Encounter (Signed)
Copied from [place] [phone]. Topic: Quick Communication - Rx Refill/Question >> Jul 13, 2018  9:51 AM Virl Axe D wrote: Medication: metFORMIN (GLUCOPHAGE) 1000 MG tablet / carvedilol (COREG) 6.25 MG tablet / lisinopril-hydrochlorothiazide (PRINZIDE,ZESTORETIC) 20-12.5 MG tablet / atorvastatin (LIPITOR) 40 MG tablet / meloxicam (MOBIC) 15 MG tablet  Has the patient contacted their pharmacy? Yes.   (Agent: If no, request that the patient contact the pharmacy for the refill.) (Agent: If yes, when and what did the pharmacy advise?)  Preferred Pharmacy (with phone number or street name): [facility], [place] [phone] (Phone) [phone] (Fax)    Agent: Please be advised that RX refills may take up to 3 business days. We ask that you follow-up with your pharmacy.

## 2018-08-11 ENCOUNTER — Other Ambulatory Visit: Payer: Self-pay | Admitting: Family Medicine

## 2018-09-07 ENCOUNTER — Other Ambulatory Visit: Payer: Self-pay

## 2018-09-07 ENCOUNTER — Encounter: Payer: Self-pay | Admitting: Family Medicine

## 2018-09-07 ENCOUNTER — Ambulatory Visit (INDEPENDENT_AMBULATORY_CARE_PROVIDER_SITE_OTHER): Payer: Medicare HMO | Admitting: Family Medicine

## 2018-09-07 VITALS — BP 122/64 | HR 78 | Temp 98.0°F | Resp 17 | Ht 72.0 in | Wt 232.0 lb

## 2018-09-07 DIAGNOSIS — I10 Essential (primary) hypertension: Secondary | ICD-10-CM

## 2018-09-07 DIAGNOSIS — E7800 Pure hypercholesterolemia, unspecified: Secondary | ICD-10-CM | POA: Diagnosis not present

## 2018-09-07 DIAGNOSIS — E1169 Type 2 diabetes mellitus with other specified complication: Secondary | ICD-10-CM | POA: Diagnosis not present

## 2018-09-07 DIAGNOSIS — Z1211 Encounter for screening for malignant neoplasm of colon: Secondary | ICD-10-CM | POA: Diagnosis not present

## 2018-09-07 DIAGNOSIS — G629 Polyneuropathy, unspecified: Secondary | ICD-10-CM

## 2018-09-07 LAB — LIPID PANEL
Chol/HDL Ratio: 3.5 ratio (ref 0.0–5.0)
Cholesterol, Total: 147 mg/dL (ref 100–199)
HDL: 42 mg/dL (ref >39)
LDL Calculated: 73 mg/dL (ref 0–99)
Triglycerides: 162 mg/dL — ABNORMAL HIGH (ref 0–149)
VLDL Cholesterol Cal: 32 mg/dL (ref 5–40)

## 2018-09-07 LAB — COMPREHENSIVE METABOLIC PANEL
ALK PHOS: 114 IU/L (ref 39–117)
ALT: 35 IU/L (ref 0–44)
AST: 29 IU/L (ref 0–40)
Albumin/Globulin Ratio: 2.2 (ref 1.2–2.2)
Albumin: 4.4 g/dL (ref 3.7–4.7)
BUN/Creatinine Ratio: 17 (ref 10–24)
BUN: 12 mg/dL (ref 8–27)
Bilirubin Total: 1 mg/dL (ref 0.0–1.2)
CO2: 19 mmol/L — ABNORMAL LOW (ref 20–29)
CREATININE: 0.69 mg/dL — AB (ref 0.76–1.27)
Calcium: 9.5 mg/dL (ref 8.6–10.2)
Chloride: 103 mmol/L (ref 96–106)
GFR calc Af Amer: 109 mL/min/{1.73_m2} (ref >59)
GFR calc non Af Amer: 94 mL/min/{1.73_m2} (ref >59)
GLOBULIN, TOTAL: 2 g/dL (ref 1.5–4.5)
Glucose: 164 mg/dL — ABNORMAL HIGH (ref 65–99)
Potassium: 4.4 mmol/L (ref 3.5–5.2)
SODIUM: 139 mmol/L (ref 134–144)
Total Protein: 6.4 g/dL (ref 6.0–8.5)

## 2018-09-07 LAB — CBC WITH DIFFERENTIAL/PLATELET
Basophils Absolute: 0 x10E3/uL (ref 0.0–0.2)
Basos: 0 %
EOS (ABSOLUTE): 0.2 x10E3/uL (ref 0.0–0.4)
Eos: 2 %
Hematocrit: 44.9 % (ref 37.5–51.0)
Hemoglobin: 15 g/dL (ref 13.0–17.7)
Immature Grans (Abs): 0 x10E3/uL (ref 0.0–0.1)
Immature Granulocytes: 0 %
Lymphocytes Absolute: 2.2 x10E3/uL (ref 0.7–3.1)
Lymphs: 30 %
MCH: 29.8 pg (ref 26.6–33.0)
MCHC: 33.4 g/dL (ref 31.5–35.7)
MCV: 89 fL (ref 79–97)
Monocytes Absolute: 0.5 x10E3/uL (ref 0.1–0.9)
Monocytes: 8 %
Neutrophils Absolute: 4.2 x10E3/uL (ref 1.4–7.0)
Neutrophils: 60 %
Platelets: 408 x10E3/uL (ref 150–450)
RBC: 5.03 x10E6/uL (ref 4.14–5.80)
RDW: 12.9 % (ref 11.6–15.4)
WBC: 7.1 x10E3/uL (ref 3.4–10.8)

## 2018-09-07 LAB — POCT GLYCOSYLATED HEMOGLOBIN (HGB A1C): Hemoglobin A1C: 7.3 % — AB (ref 4.0–5.6)

## 2018-09-07 NOTE — Patient Instructions (Signed)
° ° ° °  If you have lab work done today you will be contacted with your lab results within the next 2 weeks.  If you have not heard from us then please contact us. The fastest way to get your results is to register for My Chart. ° ° °IF you received an x-ray today, you will receive an invoice from [facility]. Please contact [facility] at [phone] with questions or concerns regarding your invoice.  ° °IF you received labwork today, you will receive an invoice from [facility]. Please contact [facility] at [phone] with questions or concerns regarding your invoice.  ° °Our billing staff will not be able to assist you with questions regarding bills from these companies. ° °You will be contacted with the lab results as soon as they are available. The fastest way to get your results is to activate your My Chart account. Instructions are located on the last page of this paperwork. If you have not heard from us regarding the results in 2 weeks, please contact this office. °  ° ° ° °

## 2018-09-07 NOTE — Progress Notes (Signed)
Established Patient Office Visit  Subjective:  Patient ID: Christopher Bauer, male    DOB: 1946-05-10  Age: 73 y.o. MRN: [id number]  CC:  Chief Complaint  Patient presents with  . Diabetes    6 month f/u    HPI Christopher Bauer presents for   Diabetes Pt reports that he is doing well with metformin.  He states that he watches what he eats He denies hypoglycemia He has neuropathy of the hands  Lab Results  Component Value Date   HGBA1C 7.3 (A) 09/07/2018   Hypertension: Patient here for follow-up of elevated blood pressure. He is exercising and is adherent to low salt diet.  Blood pressure is well controlled at home. Cardiac symptoms none. Patient denies chest pain, chest pressure/discomfort, claudication, dyspnea, exertional chest pressure/discomfort, fatigue, irregular heart beat, lower extremity edema and near-syncope.  Cardiovascular risk factors: diabetes mellitus, dyslipidemia, hypertension and male gender. Use of agents associated with hypertension: none. History of target organ damage: none. BP Readings from Last 3 Encounters:  09/07/18 122/64  03/03/18 112/66  08/24/17 126/82    Dyslipidemia: Patient presents for evaluation of lipids.  Compliance with treatment thus far has been excellent.  A repeat fasting lipid profile was done.  The patient does not use medications that may worsen dyslipidemias (corticosteroids, progestins, anabolic steroids, diuretics, beta-blockers, amiodarone, cyclosporine, olanzapine). The patient exercises intermittently.  The patient is not known to have coexisting coronary artery disease.   The 10-year ASCVD risk score Mikey Bussing DC Brooke Bonito., et al., 2013) is: 38.5%   Values used to calculate the score:     Age: 56 years     Sex: Male     Is Non-Hispanic African American: No     Diabetic: Yes     Tobacco smoker: No     Systolic Blood Pressure: 863 mmHg     Is BP treated: Yes     HDL Cholesterol: 42 mg/dL     Total Cholesterol: 148 mg/dL     Past  Medical History:  Diagnosis Date  . Arthritis   . Diabetes mellitus without complication ([place])   . Hyperlipidemia   . Hypertension     Past Surgical History:  Procedure Laterality Date  . APPENDECTOMY      Family History  Problem Relation Age of Onset  . Heart disease Mother     Social History   Socioeconomic History  . Marital status: Divorced    Spouse name: Not on file  . Number of children: 2  . Years of education: Not on file  . Highest education level: High school graduate  Occupational History  . Not on file  Social Needs  . Financial resource strain: Not hard at all  . Food insecurity:    Worry: Never true    Inability: Never true  . Transportation needs:    Medical: No    Non-medical: No  Tobacco Use  . Smoking status: Former Smoker    Packs/day: 0.25    Years: 50.00    Pack years: 12.50    Types: Cigarettes    Last attempt to quit: 03/18/2016    Years since quitting: 2.4  . Smokeless tobacco: Never Used  Substance and Sexual Activity  . Alcohol use: Yes    Alcohol/week: 0.0 standard drinks    Comment: 4 beers once a week   . Drug use: No  . Sexual activity: Not on file  Lifestyle  . Physical activity:    Days per week: 0 days  Minutes per session: 0 min  . Stress: Not at all  Relationships  . Social connections:    Talks on phone: More than three times a week    Gets together: More than three times a week    Attends religious service: Never    Active member of club or organization: No    Attends meetings of clubs or organizations: Never    Relationship status: Divorced  . Intimate partner violence:    Fear of current or ex partner: No    Emotionally abused: No    Physically abused: No    Forced sexual activity: No  Other Topics Concern  . Not on file  Social History Narrative  . Not on file    Outpatient Medications Prior to Visit  Medication Sig Dispense Refill  . aspirin 81 MG tablet Take 81 mg by mouth daily.    Marland Kitchen atorvastatin  (LIPITOR) 40 MG tablet TAKE 1 TABLET DAILY 90 tablet 0  . carvedilol (COREG) 6.25 MG tablet Take 1 tablet (6.25 mg total) by mouth 2 (two) times daily with a meal. 180 tablet 0  . lisinopril-hydrochlorothiazide (PRINZIDE,ZESTORETIC) 20-12.5 MG tablet TAKE 1 TABLET DAILY 90 tablet 0  . meloxicam (MOBIC) 15 MG tablet TAKE 1 TABLET DAILY AS     NEEDED FOR PAIN 90 tablet 0  . metFORMIN (GLUCOPHAGE) 1000 MG tablet Take 1 tablet (1,000 mg total) by mouth 2 (two) times daily with a meal. 180 tablet 0  . metFORMIN (GLUCOPHAGE) 1000 MG tablet Take 1 tablet (1,000 mg total) by mouth 2 (two) times daily with a meal. 180 tablet 0  . gabapentin (NEURONTIN) 300 MG capsule Take 1 capsule (300 mg total) by mouth 3 (three) times daily. (Patient not taking: Reported on 09/07/2018) 90 capsule 3   No facility-administered medications prior to visit.     No Known Allergies  ROS Review of Systems Review of Systems  Constitutional: Negative for activity change, appetite change, chills and fever.  HENT: Negative for congestion, nosebleeds, trouble swallowing and voice change.   Respiratory: Negative for cough, shortness of breath and wheezing.   Gastrointestinal: Negative for diarrhea, nausea and vomiting.  Genitourinary: Negative for difficulty urinating, dysuria, flank pain and hematuria.  Musculoskeletal: Negative for back pain, joint swelling and neck pain.  Neurological: Negative for dizziness, speech difficulty, light-headedness and numbness.  See HPI. All other review of systems negative.     Objective:    Physical Exam  BP 122/64 (BP Location: Left Arm, Patient Position: Sitting, Cuff Size: Large)   Pulse 78   Temp 98 F (36.7 C) (Oral)   Resp 17   Ht 6' (1.829 m)   Wt 232 lb (105.2 kg)   SpO2 97%   BMI 31.46 kg/m  Wt Readings from Last 3 Encounters:  09/07/18 232 lb (105.2 kg)  03/03/18 228 lb 6.4 oz (103.6 kg)  08/24/17 232 lb 8 oz (105.5 kg)    Physical Exam  Constitutional: Oriented  to person, place, and time. Appears well-developed and well-nourished.  HENT:  Head: Normocephalic and atraumatic.  Eyes: Conjunctivae and EOM are normal.  Cardiovascular: Normal rate, regular rhythm, normal heart sounds and intact distal pulses.  No murmur heard. Pulmonary/Chest: Effort normal and breath sounds normal. No stridor. No respiratory distress. Has no wheezes.  Neurological: Is alert and oriented to person, place, and time.  Skin: Skin is warm. Capillary refill takes less than 2 seconds.  Psychiatric: Has a normal mood and affect. Behavior is normal.  Judgment and thought content normal.   Health Maintenance Due  Topic Date Due  . COLONOSCOPY  06/08/2016  . HEMOGLOBIN A1C  09/03/2018    There are no preventive care reminders to display for this patient.  Lab Results  Component Value Date   TSH 0.981 04/26/2013   Lab Results  Component Value Date   WBC 10.9 (H) 08/24/2017   HGB 14.3 08/24/2017   HCT 41.6 08/24/2017   MCV 89 08/24/2017   PLT 399 (H) 08/24/2017   Lab Results  Component Value Date   NA 140 03/03/2018   K 4.5 03/03/2018   CO2 25 03/03/2018   GLUCOSE 128 (H) 03/03/2018   BUN 15 03/03/2018   CREATININE 0.62 (L) 03/03/2018   BILITOT 0.7 03/03/2018   ALKPHOS 104 03/03/2018   AST 21 03/03/2018   ALT 27 03/03/2018   PROT 6.5 03/03/2018   ALBUMIN 4.2 03/03/2018   CALCIUM 10.0 03/03/2018   Lab Results  Component Value Date   CHOL 148 03/03/2018   Lab Results  Component Value Date   HDL 42 03/03/2018   Lab Results  Component Value Date   LDLCALC 78 03/03/2018   Lab Results  Component Value Date   TRIG 141 03/03/2018   Lab Results  Component Value Date   CHOLHDL 3.5 03/03/2018   Lab Results  Component Value Date   HGBA1C 7.3 (A) 09/07/2018      Assessment & Plan:   Problem List Items Addressed This Visit    None    Visit Diagnoses    Type 2 diabetes mellitus with other specified complication, unspecified whether long term  insulin use ([place])    -  Primary well controlled hemoglobin a1c is at goal Continue exercise Lipids monitored and renal function in range On metformin On ace or arb On asa 81mg  Reviewed diabetic foot care Emphasized importance of eye and dental exam      Relevant Orders   POCT glycosylated hemoglobin (Hb A1C) (Completed)   Comprehensive metabolic panel   Lipid panel   CBC with Differential   Special screening for malignant neoplasms, colon       Relevant Orders   Cologuard   Polyneuropathy    - follow up with Orthopedics [facility]   Essential hypertension    - Patient's blood pressure is at goal of 139/89 or less. Condition is stable. Continue current medications and treatment plan. I recommend that you exercise for 30-45 minutes 5 days a week. I also recommend a balanced diet with fruits and vegetables every day, lean meats, and little fried foods. The DASH diet (you can find this online) is a good example of this.    Pure hypercholesterolemia    - Discussed medications that affect lipids Reminded patient to avoid grapefruits Reviewed last 3 lipids Discussed current meds: statin, aspirin Advised dietary fiber and fish oil and ways to keep HDL high CAD prevention and reviewed side effects of statins Patient is a former smoker.       No orders of the defined types were placed in this encounter.   Follow-up: Return in about 3 months (around 12/06/2018) for medicare wellness exam.    Forrest Moron, MD

## 2018-09-14 DIAGNOSIS — Z1211 Encounter for screening for malignant neoplasm of colon: Secondary | ICD-10-CM | POA: Diagnosis not present

## 2018-09-18 ENCOUNTER — Other Ambulatory Visit: Payer: Self-pay | Admitting: Family Medicine

## 2018-09-24 LAB — COLOGUARD: Cologuard: NEGATIVE

## 2018-10-07 ENCOUNTER — Other Ambulatory Visit: Payer: Self-pay | Admitting: Family Medicine

## 2018-10-17 ENCOUNTER — Other Ambulatory Visit: Payer: Self-pay | Admitting: Family Medicine

## 2018-10-24 ENCOUNTER — Other Ambulatory Visit: Payer: Self-pay | Admitting: Family Medicine

## 2018-10-25 MED ORDER — MELOXICAM 15 MG PO TABS: ORAL_TABLET | ORAL | 0 refills | Status: DC

## 2018-11-12 ENCOUNTER — Other Ambulatory Visit: Payer: Self-pay | Admitting: Family Medicine

## 2018-11-12 DIAGNOSIS — H52209 Unspecified astigmatism, unspecified eye: Secondary | ICD-10-CM | POA: Diagnosis not present

## 2018-11-12 DIAGNOSIS — H5213 Myopia, bilateral: Secondary | ICD-10-CM | POA: Diagnosis not present

## 2019-02-05 ENCOUNTER — Other Ambulatory Visit: Payer: Self-pay | Admitting: Family Medicine

## 2019-03-01 ENCOUNTER — Other Ambulatory Visit: Payer: Self-pay

## 2019-03-01 ENCOUNTER — Ambulatory Visit (INDEPENDENT_AMBULATORY_CARE_PROVIDER_SITE_OTHER): Payer: Medicare HMO | Admitting: Family Medicine

## 2019-03-01 ENCOUNTER — Encounter: Payer: Self-pay | Admitting: Family Medicine

## 2019-03-01 VITALS — BP 106/65 | HR 80 | Temp 98.8°F | Ht 72.0 in | Wt 226.8 lb

## 2019-03-01 DIAGNOSIS — E782 Mixed hyperlipidemia: Secondary | ICD-10-CM | POA: Diagnosis not present

## 2019-03-01 DIAGNOSIS — M79642 Pain in left hand: Secondary | ICD-10-CM

## 2019-03-01 DIAGNOSIS — Z299 Encounter for prophylactic measures, unspecified: Secondary | ICD-10-CM

## 2019-03-01 DIAGNOSIS — M79641 Pain in right hand: Secondary | ICD-10-CM

## 2019-03-01 DIAGNOSIS — I10 Essential (primary) hypertension: Secondary | ICD-10-CM | POA: Diagnosis not present

## 2019-03-01 DIAGNOSIS — E1169 Type 2 diabetes mellitus with other specified complication: Secondary | ICD-10-CM

## 2019-03-01 DIAGNOSIS — B351 Tinea unguium: Secondary | ICD-10-CM

## 2019-03-01 DIAGNOSIS — B353 Tinea pedis: Secondary | ICD-10-CM

## 2019-03-01 DIAGNOSIS — R202 Paresthesia of skin: Secondary | ICD-10-CM

## 2019-03-01 DIAGNOSIS — Z5181 Encounter for therapeutic drug level monitoring: Secondary | ICD-10-CM

## 2019-03-01 DIAGNOSIS — Z23 Encounter for immunization: Secondary | ICD-10-CM | POA: Diagnosis not present

## 2019-03-01 LAB — POCT GLYCOSYLATED HEMOGLOBIN (HGB A1C): Hemoglobin A1C: 7.1 % — AB (ref 4.0–5.6)

## 2019-03-01 NOTE — Progress Notes (Signed)
Established Patient Office Visit  Subjective:  Patient ID: Christopher Bauer, male    DOB: 1945-12-30  Age: 73 y.o. MRN: [id number]  CC:  Chief Complaint  Patient presents with  . Diabetes    6 m f/u   . Medication Refill    HPI Christopher Bauer presents for   Diabetes Mellitus: Patient presents for follow up of diabetes. Symptoms: polyuria. Symptoms have stabilized. Patient denies hyperglycemia, hypoglycemia  and visual disturbances.  Evaluation to date has been included: hemoglobin A1C.  Home sugars: patient does not check sugars. Treatment to date: Continued metformin which has been effective.  Lab Results  Component Value Date   HGBA1C 7.1 (A) 03/01/2019   He reports that he has cold feet at night  Hypertension: Patient here for follow-up of elevated blood pressure. He is not exercising and is adherent to low salt diet.  Blood pressure is well controlled at home. Cardiac symptoms none. Patient denies chest pain, chest pressure/discomfort, claudication, exertional chest pressure/discomfort, fatigue, irregular heart beat and lower extremity edema.  Cardiovascular risk factors: diabetes mellitus, dyslipidemia and hypertension. Use of agents associated with hypertension: none. History of target organ damage: none. BP Readings from Last 3 Encounters:  03/01/19 106/65  09/07/18 122/64  03/03/18 112/66    Dyslipidemia: Patient presents for evaluation of lipids.  Compliance with treatment thus far has been good.  A repeat fasting lipid profile was done.  The patient does use medications that may worsen dyslipidemias (corticosteroids, progestins, anabolic steroids, diuretics, beta-blockers, amiodarone, cyclosporine, olanzapine). The patient exercises rarely.  The patient is not known to have coexisting coronary artery disease.    Wt Readings from Last 3 Encounters:  03/01/19 226 lb 12.8 oz (102.9 kg)  09/07/18 232 lb (105.2 kg)  03/03/18 228 lb 6.4 oz (103.6 kg)      Lab Results   Component Value Date   CHOL 158 03/01/2019   CHOL 147 09/07/2018   CHOL 148 03/03/2018   Lab Results  Component Value Date   HDL 43 03/01/2019   HDL 42 09/07/2018   HDL 42 03/03/2018   Lab Results  Component Value Date   LDLCALC 70 03/01/2019   LDLCALC 73 09/07/2018   LDLCALC 78 03/03/2018   Lab Results  Component Value Date   TRIG 225 (H) 03/01/2019   TRIG 162 (H) 09/07/2018   TRIG 141 03/03/2018   Lab Results  Component Value Date   CHOLHDL 3.7 03/01/2019   CHOLHDL 3.5 09/07/2018   CHOLHDL 3.5 03/03/2018   No results found for: LDLDIRECT    Arthralgia He reports that he has swelling of his hands bilaterally with numbness of his fingers He had Orthopedic Hand Surgery and had a trigger finger release surgery on the right He also had a cyst removed on the right hand and a carpal tunnel release surgery He continues to have locking of his middle finger in the hand He states that he does not  Know if he needs to see a specialist other than Orthopaedics His index and middle finger on the left are irritating at night with a sense of inflammation He cannot grip the club   Past Medical History:  Diagnosis Date  . Arthritis   . Diabetes mellitus without complication ([place])   . Hyperlipidemia   . Hypertension     Past Surgical History:  Procedure Laterality Date  . APPENDECTOMY      Family History  Problem Relation Age of Onset  . Heart disease Mother  Social History   Socioeconomic History  . Marital status: Divorced    Spouse name: Not on file  . Number of children: 2  . Years of education: Not on file  . Highest education level: High school graduate  Occupational History  . Not on file  Social Needs  . Financial resource strain: Not hard at all  . Food insecurity    Worry: Never true    Inability: Never true  . Transportation needs    Medical: No    Non-medical: No  Tobacco Use  . Smoking status: Former Smoker    Packs/day: 0.25    Years:  50.00    Pack years: 12.50    Types: Cigarettes    Quit date: 03/18/2016    Years since quitting: 2.9  . Smokeless tobacco: Never Used  Substance and Sexual Activity  . Alcohol use: Yes    Alcohol/week: 0.0 standard drinks    Comment: 4 beers once a week   . Drug use: No  . Sexual activity: Not on file  Lifestyle  . Physical activity    Days per week: 0 days    Minutes per session: 0 min  . Stress: Not at all  Relationships  . Social connections    Talks on phone: More than three times a week    Gets together: More than three times a week    Attends religious service: Never    Active member of club or organization: No    Attends meetings of clubs or organizations: Never    Relationship status: Divorced  . Intimate partner violence    Fear of current or ex partner: No    Emotionally abused: No    Physically abused: No    Forced sexual activity: No  Other Topics Concern  . Not on file  Social History Narrative  . Not on file    Outpatient Medications Prior to Visit  Medication Sig Dispense Refill  . aspirin 81 MG tablet Take 81 mg by mouth daily.    Marland Kitchen atorvastatin (LIPITOR) 40 MG tablet TAKE 1 TABLET DAILY 90 tablet 0  . carvedilol (COREG) 6.25 MG tablet TAKE 1 TABLET TWICE DAILY  WITH MEALS 180 tablet 0  . lisinopril-hydrochlorothiazide (ZESTORETIC) 20-12.5 MG tablet TAKE 1 TABLET DAILY 90 tablet 0  . meloxicam (MOBIC) 15 MG tablet TAKE 1 TABLET DAILY AS     NEEDED FOR PAIN 90 tablet 0  . metFORMIN (GLUCOPHAGE) 1000 MG tablet TAKE 1 TABLET TWICE DAILY  WITH MEALS 180 tablet 0  . meloxicam (MOBIC) 15 MG tablet TAKE 1 TABLET DAILY AS     NEEDED FOR PAIN 90 tablet 0  . metFORMIN (GLUCOPHAGE) 1000 MG tablet Take 1 tablet (1,000 mg total) by mouth 2 (two) times daily with a meal. 180 tablet 0   No facility-administered medications prior to visit.     No Known Allergies  ROS Review of Systems Review of Systems  Constitutional: Negative for activity change, appetite  change, chills and fever.  HENT: Negative for congestion, nosebleeds, trouble swallowing and voice change.   Respiratory: Negative for cough, shortness of breath and wheezing.   Gastrointestinal: Negative for diarrhea, nausea and vomiting.  Genitourinary: Negative for difficulty urinating, dysuria, flank pain and hematuria.  Musculoskeletal: see hpi Neurological: Negative for dizziness, speech difficulty, light-headedness and numbness.  See HPI. All other review of systems negative.     Objective:    Physical Exam  BP 106/65 (BP Location: Right Arm, Patient Position:  Sitting, Cuff Size: Large)   Pulse 80   Temp 98.8 F (37.1 C) (Oral)   Ht 6' (1.829 m)   Wt 226 lb 12.8 oz (102.9 kg)   SpO2 97%   BMI 30.76 kg/m  Wt Readings from Last 3 Encounters:  03/01/19 226 lb 12.8 oz (102.9 kg)  09/07/18 232 lb (105.2 kg)  03/03/18 228 lb 6.4 oz (103.6 kg)   Physical Exam  Constitutional: Oriented to person, place, and time. Appears well-developed and well-nourished.  HENT:  Head: Normocephalic and atraumatic.  Eyes: Conjunctivae and EOM are normal.  Cardiovascular: Normal rate, regular rhythm, normal heart sounds and intact distal pulses.  No murmur heard. Pulmonary/Chest: Effort normal and breath sounds normal. No stridor. No respiratory distress. Has no wheezes.  Neurological: Is alert and oriented to person, place, and time.  Skin: Skin is warm. Capillary refill takes less than 2 seconds.  Psychiatric: Has a normal mood and affect. Behavior is normal. Judgment and thought content normal.  Diabetic Foot Exam - Simple   Simple Foot Form Diabetic Foot exam was performed with the following findings: Yes 03/01/2019  9:50 AM  Visual Inspection No deformities, no ulcerations, no other skin breakdown bilaterally: Yes Sensation Testing See comments: Yes Pulse Check Posterior Tibialis and Dorsalis pulse intact bilaterally: Yes Comments Can not feel top of feet on both feet and number 4  near the big toe on the back for the right foot      Feet with thickened gray nails and scales on feet.  There are no preventive care reminders to display for this patient.  There are no preventive care reminders to display for this patient.  Lab Results  Component Value Date   TSH 0.981 04/26/2013   Lab Results  Component Value Date   WBC 7.1 09/07/2018   HGB 15.0 09/07/2018   HCT 44.9 09/07/2018   MCV 89 09/07/2018   PLT 408 09/07/2018   Lab Results  Component Value Date   NA 139 03/01/2019   K 4.5 03/01/2019   CO2 22 03/01/2019   GLUCOSE 143 (H) 03/01/2019   BUN 16 03/01/2019   CREATININE 0.95 03/01/2019   BILITOT 0.9 03/01/2019   ALKPHOS 113 03/01/2019   AST 32 03/01/2019   ALT 35 03/01/2019   PROT 6.4 03/01/2019   ALBUMIN 4.4 03/01/2019   CALCIUM 10.2 03/01/2019   Lab Results  Component Value Date   CHOL 158 03/01/2019   Lab Results  Component Value Date   HDL 43 03/01/2019   Lab Results  Component Value Date   LDLCALC 70 03/01/2019   Lab Results  Component Value Date   TRIG 225 (H) 03/01/2019   Lab Results  Component Value Date   CHOLHDL 3.7 03/01/2019   Lab Results  Component Value Date   HGBA1C 7.1 (A) 03/01/2019      Assessment & Plan:   Problem List Items Addressed This Visit    None    Visit Diagnoses    DM type 2 with diabetic mixed hyperlipidemia ([place])    -  Primary well controlled hemoglobin a1c is at goal Continue exercise Lipids monitored and renal function in range On metformin On lisinopril On asa 81mg  Reviewed diabetic foot care Emphasized importance of eye and dental exam     Relevant Orders   POCT glycosylated hemoglobin (Hb A1C) (Completed)   Lipid panel (Completed)   Comprehensive metabolic panel (Completed)   HM Diabetes Foot Exam (Completed)   Need for prophylactic measure  Relevant Orders   Flu Vaccine QUAD High Dose(Fluad) (Completed)   Essential hypertension    - Patient's blood pressure is at  goal of 139/89 or less. Condition is stable. Continue current medications and treatment plan. I recommend that you exercise for 30-45 minutes 5 days a week. I also recommend a balanced diet with fruits and vegetables every day, lean meats, and little fried foods. The DASH diet (you can find this online) is a good example of this.    Relevant Orders   Lipid panel (Completed)   Comprehensive metabolic panel (Completed)   Bilateral hand pain    -  Follow up with Orthopedics   Relevant Orders   Ambulatory referral to Orthopedic Surgery   Paresthesia    - B12 borderline, advised otc B12 supplementation    Relevant Orders   Vitamin B12 (Completed)   Tinea pedis of both feet    -  Prescribed lamisil   Relevant Orders   Hepatic Function Panel   Onychomycosis    -  Will treat with lamisil   Relevant Orders   Hepatic Function Panel   Encounter for medication monitoring          No orders of the defined types were placed in this encounter.   Follow-up: No follow-ups on file.    Forrest Moron, MD

## 2019-03-01 NOTE — Patient Instructions (Addendum)
Patient will need lab visit around October 5th for his blood test  Next appointment with me is in six months.     If you have lab work done today you will be contacted with your lab results within the next 2 weeks.  If you have not heard from [place] then please contact us. The fastest way to get your results is to register for My Chart.   IF you received an x-ray today, you will receive an invoice from [facility]. Please contact [facility] at [phone] with questions or concerns regarding your invoice.   IF you received labwork today, you will receive an invoice from [place] [facility]. Please contact [facility] at [phone] with questions or concerns regarding your invoice.   Our billing staff will not be able to assist you with questions regarding bills from these companies.  You will be contacted with the lab results as soon as they are available. The fastest way to get your results is to activate your My Chart account. Instructions are located on the last page of this paperwork. If you have not heard from [place] regarding the results in 2 weeks, please contact this office.      Fungal Nail Infection A fungal nail infection is a common infection of the toenails or fingernails. This condition affects toenails more often than fingernails. It often affects the great, or big, toes. More than one nail may be infected. The condition can be passed from person to person (is contagious). What are the causes? This condition is caused by a fungus. Several types of fungi can cause the infection. These fungi are common in moist and warm areas. If your hands or feet come into contact with the fungus, it may get into a crack in your fingernail or toenail and cause the infection. What increases the risk? The following factors may make you more likely to develop this condition:  Being male.  Being of older age.  Living with someone who has the fungus.  Walking barefoot in areas where the  fungus thrives, such as showers or locker rooms.  Wearing shoes and socks that cause your feet to sweat.  Having a nail injury or a recent nail surgery.  Having certain medical conditions, such as: ? Athlete's foot. ? Diabetes. ? Psoriasis. ? Poor circulation. ? A weak body defense system (immune system). What are the signs or symptoms? Symptoms of this condition include:  A pale spot on the nail.  Thickening of the nail.  A nail that becomes yellow or brown.  A brittle or ragged nail edge.  A crumbling nail.  A nail that has lifted away from the nail bed. How is this diagnosed? This condition is diagnosed with a physical exam. Your health care provider may take a scraping or clipping from your nail to test for the fungus. How is this treated? Treatment is not needed for mild infections. If you have significant nail changes, treatment may include:  Antifungal medicines taken by mouth (orally). You may need to take the medicine for several weeks or several months, and you may not see the results for a long time. These medicines can cause side effects. Ask your health care provider what problems to watch for.  Antifungal nail polish or nail cream. These may be used along with oral antifungal medicines.  Laser treatment of the nail.  Surgery to remove the nail. This may be needed for the most severe infections. It can take a long time, usually up to a  year, for the infection to go away. The infection may also come back. Follow these instructions at home: Medicines  Take or apply over-the-counter and prescription medicines only as told by your health care provider.  Ask your health care provider about using over-the-counter mentholated ointment on your nails. Nail care  Trim your nails often.  Wash and dry your hands and feet every day.  Keep your feet dry: ? Wear absorbent socks, and change your socks frequently. ? Wear shoes that allow air to circulate, such as  sandals or canvas tennis shoes. Throw out old shoes.  Do not use artificial nails.  If you go to a nail salon, make sure you choose one that uses clean instruments.  Use antifungal foot powder on your feet and in your shoes. General instructions  Do not share personal items, such as towels or nail clippers.  Do not walk barefoot in shower rooms or locker rooms.  Wear rubber gloves if you are working with your hands in wet areas.  Keep all follow-up visits as told by your health care provider. This is important. Contact a health care provider if: Your infection is not getting better or it is getting worse after several months. Summary  A fungal nail infection is a common infection of the toenails or fingernails.  Treatment is not needed for mild infections. If you have significant nail changes, treatment may include taking medicine orally and applying medicine to your nails.  It can take a long time, usually up to a year, for the infection to go away. The infection may also come back.  Take or apply over-the-counter and prescription medicines only as told by your health care provider.  Follow instructions for taking care of your nails to help prevent infection from coming back or spreading. This information is not intended to replace advice given to you by your health care provider. Make sure you discuss any questions you have with your health care provider. Document Released: 06/24/2000 Document Revised: 10/18/2018 Document Reviewed: 12/01/2017 Elsevier Patient Education  2020 [employer].

## 2019-03-02 LAB — COMPREHENSIVE METABOLIC PANEL
ALT: 35 IU/L (ref 0–44)
AST: 32 IU/L (ref 0–40)
Albumin/Globulin Ratio: 2.2 (ref 1.2–2.2)
Albumin: 4.4 g/dL (ref 3.7–4.7)
Alkaline Phosphatase: 113 IU/L (ref 39–117)
BUN/Creatinine Ratio: 17 (ref 10–24)
BUN: 16 mg/dL (ref 8–27)
Bilirubin Total: 0.9 mg/dL (ref 0.0–1.2)
CO2: 22 mmol/L (ref 20–29)
Calcium: 10.2 mg/dL (ref 8.6–10.2)
Chloride: 101 mmol/L (ref 96–106)
Creatinine, Ser: 0.95 mg/dL (ref 0.76–1.27)
GFR calc Af Amer: 91 mL/min/{1.73_m2} (ref >59)
GFR calc non Af Amer: 79 mL/min/{1.73_m2} (ref >59)
Globulin, Total: 2 g/dL (ref 1.5–4.5)
Glucose: 143 mg/dL — ABNORMAL HIGH (ref 65–99)
Potassium: 4.5 mmol/L (ref 3.5–5.2)
Sodium: 139 mmol/L (ref 134–144)
Total Protein: 6.4 g/dL (ref 6.0–8.5)

## 2019-03-02 LAB — LIPID PANEL
Chol/HDL Ratio: 3.7 ratio (ref 0.0–5.0)
Cholesterol, Total: 158 mg/dL (ref 100–199)
HDL: 43 mg/dL (ref >39)
LDL Calculated: 70 mg/dL (ref 0–99)
Triglycerides: 225 mg/dL — ABNORMAL HIGH (ref 0–149)
VLDL Cholesterol Cal: 45 mg/dL — ABNORMAL HIGH (ref 5–40)

## 2019-03-02 LAB — VITAMIN B12: Vitamin B-12: 313 pg/mL (ref 232–1245)

## 2019-03-05 ENCOUNTER — Other Ambulatory Visit: Payer: Self-pay | Admitting: Family Medicine

## 2019-03-05 MED ORDER — TERBINAFINE HCL 250 MG PO TABS: 250.0000 mg | ORAL_TABLET | Freq: Every day | ORAL | 1 refills | Status: DC

## 2019-03-07 ENCOUNTER — Encounter: Payer: Self-pay | Admitting: Family Medicine

## 2019-03-07 MED ORDER — VITAMIN B-12 1000 MCG PO TABS: 1000.0000 ug | ORAL_TABLET | Freq: Every day | ORAL | 11 refills | Status: DC

## 2019-03-08 ENCOUNTER — Ambulatory Visit (INDEPENDENT_AMBULATORY_CARE_PROVIDER_SITE_OTHER): Payer: Medicare HMO | Admitting: Family Medicine

## 2019-03-08 ENCOUNTER — Other Ambulatory Visit: Payer: Self-pay

## 2019-03-08 ENCOUNTER — Encounter: Payer: Self-pay | Admitting: Family Medicine

## 2019-03-08 VITALS — BP 143/77 | HR 101 | Temp 99.2°F | Resp 17 | Ht 72.0 in | Wt 221.6 lb

## 2019-03-08 DIAGNOSIS — R399 Unspecified symptoms and signs involving the genitourinary system: Secondary | ICD-10-CM | POA: Diagnosis not present

## 2019-03-08 DIAGNOSIS — M79642 Pain in left hand: Secondary | ICD-10-CM | POA: Diagnosis not present

## 2019-03-08 DIAGNOSIS — R319 Hematuria, unspecified: Secondary | ICD-10-CM

## 2019-03-08 DIAGNOSIS — N390 Urinary tract infection, site not specified: Secondary | ICD-10-CM | POA: Diagnosis not present

## 2019-03-08 DIAGNOSIS — M65331 Trigger finger, right middle finger: Secondary | ICD-10-CM | POA: Diagnosis not present

## 2019-03-08 DIAGNOSIS — Z23 Encounter for immunization: Secondary | ICD-10-CM

## 2019-03-08 DIAGNOSIS — M1180 Other specified crystal arthropathies, unspecified site: Secondary | ICD-10-CM | POA: Diagnosis not present

## 2019-03-08 DIAGNOSIS — M79644 Pain in right finger(s): Secondary | ICD-10-CM | POA: Diagnosis not present

## 2019-03-08 DIAGNOSIS — N402 Nodular prostate without lower urinary tract symptoms: Secondary | ICD-10-CM | POA: Diagnosis not present

## 2019-03-08 LAB — POC MICROSCOPIC URINALYSIS (UMFC): Mucus: ABSENT

## 2019-03-08 LAB — POCT URINALYSIS DIP (MANUAL ENTRY)
Bilirubin, UA: NEGATIVE
Glucose, UA: NEGATIVE mg/dL
Ketones, POC UA: NEGATIVE mg/dL
Nitrite, UA: POSITIVE — AB
Protein Ur, POC: >=300 mg/dL — AB
Spec Grav, UA: 1.02 (ref 1.010–1.025)
Urobilinogen, UA: 1 E.U./dL
pH, UA: 6.5 (ref 5.0–8.0)

## 2019-03-08 LAB — POCT CBC
Granulocyte percent: 86.5 %G — AB (ref 37–80)
HCT, POC: 41.2 % — AB (ref 29–41)
Hemoglobin: 14.1 g/dL (ref 11–14.6)
Lymph, poc: 1.4 (ref 0.6–3.4)
MCH, POC: 30.9 pg (ref 27–31.2)
MCHC: 34.4 g/dL (ref 31.8–35.4)
MCV: 89.9 fL (ref 76–111)
MID (cbc): 0.5 (ref 0–0.9)
MPV: 6.8 fL (ref 0–99.8)
POC Granulocyte: 12.2 — AB (ref 2–6.9)
POC LYMPH PERCENT: 9.9 %L — AB (ref 10–50)
POC MID %: 3.6 %M (ref 0–12)
Platelet Count, POC: 337 10*3/uL (ref 142–424)
RBC: 4.58 M/uL — AB (ref 4.69–6.13)
RDW, POC: 13.7 %
WBC: 14.1 10*3/uL — AB (ref 4.6–10.2)

## 2019-03-08 MED ORDER — SULFAMETHOXAZOLE-TRIMETHOPRIM 800-160 MG PO TABS: 1.0000 | ORAL_TABLET | Freq: Two times a day (BID) | ORAL | 0 refills | Status: DC

## 2019-03-08 NOTE — Patient Instructions (Addendum)
Start antibiotics for possible prostate infection or other urinary tract infection.  I will check a prostate test but also will refer you to urology as prostate exam today suggested possible nodules or enlargement.  Recheck with me in 2 weeks for repeat testing, but if any new or worsening symptoms prior to that time be seen right away here or emergency room as we discussed.  Please let me know if there are questions in the meantime.   Urinary Tract Infection, Adult  A urinary tract infection (UTI) is an infection of any part of the urinary tract. The urinary tract includes the kidneys, ureters, bladder, and urethra. These organs make, store, and get rid of urine in the body. Your health care provider may use other names to describe the infection. An upper UTI affects the ureters and kidneys (pyelonephritis). A lower UTI affects the bladder (cystitis) and urethra (urethritis). What are the causes? Most urinary tract infections are caused by bacteria in your genital area, around the entrance to your urinary tract (urethra). These bacteria grow and cause inflammation of your urinary tract. What increases the risk? You are more likely to develop this condition if:  You have a urinary catheter that stays in place (indwelling).  You are not able to control when you urinate or have a bowel movement (you have incontinence).  You are male and you: ? Use a spermicide or diaphragm for birth control. ? Have low estrogen levels. ? Are pregnant.  You have certain genes that increase your risk (genetics).  You are sexually active.  You take antibiotic medicines.  You have a condition that causes your flow of urine to slow down, such as: ? An enlarged prostate, if you are male. ? Blockage in your urethra (stricture). ? A kidney stone. ? A nerve condition that affects your bladder control (neurogenic bladder). ? Not getting enough to drink, or not urinating often.  You have certain medical  conditions, such as: ? Diabetes. ? A weak disease-fighting system (immunesystem). ? Sickle cell disease. ? Gout. ? Spinal cord injury. What are the signs or symptoms? Symptoms of this condition include:  Needing to urinate right away (urgently).  Frequent urination or passing small amounts of urine frequently.  Pain or burning with urination.  Blood in the urine.  Urine that smells bad or unusual.  Trouble urinating.  Cloudy urine.  Vaginal discharge, if you are male.  Pain in the abdomen or the lower back. You may also have:  Vomiting or a decreased appetite.  Confusion.  Irritability or tiredness.  A fever.  Diarrhea. The first symptom in older adults may be confusion. In some cases, they may not have any symptoms until the infection has worsened. How is this diagnosed? This condition is diagnosed based on your medical history and a physical exam. You may also have other tests, including:  Urine tests.  Blood tests.  Tests for sexually transmitted infections (STIs). If you have had more than one UTI, a cystoscopy or imaging studies may be done to determine the cause of the infections. How is this treated? Treatment for this condition includes:  Antibiotic medicine.  Over-the-counter medicines to treat discomfort.  Drinking enough water to stay hydrated. If you have frequent infections or have other conditions such as a kidney stone, you may need to see a health care provider who specializes in the urinary tract (urologist). In rare cases, urinary tract infections can cause sepsis. Sepsis is a life-threatening condition that occurs when the body  responds to an infection. Sepsis is treated in the hospital with IV antibiotics, fluids, and other medicines. Follow these instructions at home:  Medicines  Take over-the-counter and prescription medicines only as told by your health care provider.  If you were prescribed an antibiotic medicine, take it as  told by your health care provider. Do not stop using the antibiotic even if you start to feel better. General instructions  Make sure you: ? Empty your bladder often and completely. Do not hold urine for long periods of time. ? Empty your bladder after sex. ? Wipe from front to back after a bowel movement if you are male. Use each tissue one time when you wipe.  Drink enough fluid to keep your urine pale yellow.  Keep all follow-up visits as told by your health care provider. This is important. Contact a health care provider if:  Your symptoms do not get better after 1-2 days.  Your symptoms go away and then return. Get help right away if you have:  Severe pain in your back or your lower abdomen.  A fever.  Nausea or vomiting. Summary  A urinary tract infection (UTI) is an infection of any part of the urinary tract, which includes the kidneys, ureters, bladder, and urethra.  Most urinary tract infections are caused by bacteria in your genital area, around the entrance to your urinary tract (urethra).  Treatment for this condition often includes antibiotic medicines.  If you were prescribed an antibiotic medicine, take it as told by your health care provider. Do not stop using the antibiotic even if you start to feel better.  Keep all follow-up visits as told by your health care provider. This is important. This information is not intended to replace advice given to you by your health care provider. Make sure you discuss any questions you have with your health care provider. Document Released: 04/06/2005 Document Revised: 06/14/2018 Document Reviewed: 01/04/2018 Elsevier Patient Education  [employer].     If you have lab work done today you will be contacted with your lab results within the next 2 weeks.  If you have not heard from [place] then please contact us. The fastest way to get your results is to register for My Chart.   IF you received an x-ray today, you will  receive an invoice from [facility]. Please contact [facility] at [phone] with questions or concerns regarding your invoice.   IF you received labwork today, you will receive an invoice from [place]. Please contact [facility] at [phone] with questions or concerns regarding your invoice.   Our billing staff will not be able to assist you with questions regarding bills from these companies.  You will be contacted with the lab results as soon as they are available. The fastest way to get your results is to activate your My Chart account. Instructions are located on the last page of this paperwork. If you have not heard from [place] regarding the results in 2 weeks, please contact this office.

## 2019-03-08 NOTE — Progress Notes (Signed)
Subjective:    Patient ID: Christopher Bauer, male    DOB: [birth year], 73 y.o.   MRN: [id number]  HPI Christopher Bauer is a 73 y.o. male Presents today for: Chief Complaint  Patient presents with  . symptoms of urinary tract infection    symptoms x 3-4 days, taking [facility] otc urinary pain relief w/ some relief.  Pt received flu shot o 03/01/2019   Dysuria: started with burning, frequency 3 days ago.  No hematuria. No retention symptoms.  Fever of 101 3 days ago. Took tylenol once and resolved - no recurrence of fever, no new back pain.  Symptoms same overall.  No penile d/c, no testicular pain.  History of UTI- about 5 years ago?   Tx: pyridium 99.5mg  2 pills tid for past few days.   Last PSA 0.77 in April 2017. Hx of DM, A1c 7.1 last week. Not on SGLT2.  Patient Active Problem List   Diagnosis Date Noted  . Former smoker 08/24/2016  . Coronary artery calcification 02/04/2016  . Abnormal liver function test 10/15/2015  . Abnormal CXR 05/01/2014  . Diabetes mellitus type II 04/13/2012  . High cholesterol 04/13/2012  . Osteoarthritis of both knees 04/13/2012  . Overweight(278.02) 04/13/2012   Past Medical History:  Diagnosis Date  . Arthritis   . Diabetes mellitus without complication ([place])   . Hyperlipidemia   . Hypertension    Past Surgical History:  Procedure Laterality Date  . APPENDECTOMY     No Known Allergies Prior to Admission medications   Medication Sig Start Date End Date Taking? Authorizing Provider  aspirin 81 MG tablet Take 81 mg by mouth daily.   Yes [provider]  atorvastatin (LIPITOR) 40 MG tablet TAKE 1 TABLET DAILY 02/05/19  Yes Stallings, Zoe A, MD  carvedilol (COREG) 6.25 MG tablet TAKE 1 TABLET TWICE DAILY  WITH MEALS 02/05/19  Yes Stallings, Zoe A, MD  lisinopril-hydrochlorothiazide (ZESTORETIC) 20-12.5 MG tablet TAKE 1 TABLET DAILY 02/05/19  Yes Stallings, Zoe A, MD  meloxicam (MOBIC) 15 MG tablet TAKE 1 TABLET DAILY AS     NEEDED FOR PAIN  10/25/18  Yes Stallings, Zoe A, MD  metFORMIN (GLUCOPHAGE) 1000 MG tablet TAKE 1 TABLET TWICE DAILY  WITH MEALS 02/05/19  Yes Stallings, Zoe A, MD  terbinafine (LAMISIL) 250 MG tablet Take 1 tablet (250 mg total) by mouth daily. 03/05/19  Yes Forrest Moron, MD  vitamin B-12 (CYANOCOBALAMIN) 1000 MCG tablet Take 1 tablet (1,000 mcg total) by mouth daily. 03/07/19  Yes Forrest Moron, MD   Social History   Socioeconomic History  . Marital status: Divorced    Spouse name: Not on file  . Number of children: 2  . Years of education: Not on file  . Highest education level: High school graduate  Occupational History  . Not on file  Social Needs  . Financial resource strain: Not hard at all  . Food insecurity    Worry: Never true    Inability: Never true  . Transportation needs    Medical: No    Non-medical: No  Tobacco Use  . Smoking status: Former Smoker    Packs/day: 0.25    Years: 50.00    Pack years: 12.50    Types: Cigarettes    Quit date: 03/18/2016    Years since quitting: 2.9  . Smokeless tobacco: Never Used  Substance and Sexual Activity  . Alcohol use: Yes    Alcohol/week: 0.0 standard drinks    Comment:  4 beers once a week   . Drug use: No  . Sexual activity: Not on file  Lifestyle  . Physical activity    Days per week: 0 days    Minutes per session: 0 min  . Stress: Not at all  Relationships  . Social connections    Talks on phone: More than three times a week    Gets together: More than three times a week    Attends religious service: Never    Active member of club or organization: No    Attends meetings of clubs or organizations: Never    Relationship status: Divorced  . Intimate partner violence    Fear of current or ex partner: No    Emotionally abused: No    Physically abused: No    Forced sexual activity: No  Other Topics Concern  . Not on file  Social History Narrative  . Not on file    Review of Systems     Objective:   Physical Exam  Vitals signs reviewed.  Constitutional:      General: He is not in acute distress.    Appearance: He is well-developed.  HENT:     Head: Normocephalic and atraumatic.  Eyes:     Pupils: Pupils are equal, round, and reactive to light.  Neck:     Vascular: No carotid bruit or JVD.  Cardiovascular:     Rate and Rhythm: Normal rate and regular rhythm.     Heart sounds: Normal heart sounds. No murmur.  Pulmonary:     Effort: Pulmonary effort is normal.     Breath sounds: Normal breath sounds. No rales.  Abdominal:     General: There is no distension.     Tenderness: There is no abdominal tenderness. There is no right CVA tenderness, left CVA tenderness, guarding or rebound.  Genitourinary:    Penis: Normal.      Scrotum/Testes: Normal.        Right: Tenderness not present.        Left: Tenderness not present.     Epididymis:     Right: Normal.     Left: Normal.     Prostate: Enlarged and nodules present (Enlarged, nodular few, especially lower and right side.  Nontender.). Not tender.  Skin:    General: Skin is warm and dry.  Neurological:     Mental Status: He is alert and oriented to person, place, and time.    Vitals:   03/08/19 1012  BP: (!) 143/77  Pulse: (!) 101  Resp: 17  Temp: 99.2 F (37.3 C)  TempSrc: Oral  SpO2: 95%  Weight: 221 lb 9.6 oz (100.5 kg)  Height: 6' (1.829 m)    Results for orders placed or performed in visit on 03/08/19  PSA  Result Value Ref Range   Prostate Specific Ag, Serum 9.0 (H) 0.0 - 4.0 ng/mL  POCT urinalysis dipstick  Result Value Ref Range   Color, UA orange (A) yellow   Clarity, UA clear clear   Glucose, UA negative negative mg/dL   Bilirubin, UA negative negative   Ketones, POC UA negative negative mg/dL   Spec Grav, UA 1.020 1.010 - 1.025   Blood, UA large (A) negative   pH, UA 6.5 5.0 - 8.0   Protein Ur, POC >=300 (A) negative mg/dL   Urobilinogen, UA 1.0 0.2 or 1.0 E.U./dL   Nitrite, UA Positive (A) Negative    Leukocytes, UA Large (3+) (A) Negative  POCT Microscopic  Urinalysis ([facility])  Result Value Ref Range   WBC,UR,HPF,POC Too numerous to count  (A) None WBC/hpf   RBC,UR,HPF,POC Too numerous to count  (A) None RBC/hpf   Bacteria Few (A) None, Too numerous to count   Mucus Absent Absent   Epithelial Cells, UR Per Microscopy Few (A) None, Too numerous to count cells/hpf  POCT CBC  Result Value Ref Range   WBC 14.1 (A) 4.6 - 10.2 K/uL   Lymph, poc 1.4 0.6 - 3.4   POC LYMPH PERCENT 9.9 (A) 10 - 50 %L   MID (cbc) 0.5 0 - 0.9   POC MID % 3.6 0 - 12 %M   POC Granulocyte 12.2 (A) 2 - 6.9   Granulocyte percent 86.5 (A) 37 - 80 %G   RBC 4.58 (A) 4.69 - 6.13 M/uL   Hemoglobin 14.1 11 - 14.6 g/dL   HCT, POC 41.2 (A) 29 - 41 %   MCV 89.9 76 - 111 fL   MCH, POC 30.9 27 - 31.2 pg   MCHC 34.4 31.8 - 35.4 g/dL   RDW, POC 13.7 %   Platelet Count, POC 337 142 - 424 K/uL   MPV 6.8 0 - 99.8 fL       Assessment & Plan:    Christopher Bauer is a 73 y.o. male Urinary tract infection symptoms - Plan: POCT urinalysis dipstick, POCT Microscopic Urinalysis ([facility]), Urine Culture Urinary tract infection with hematuria, site unspecified - Plan: POCT CBC, PSA, sulfamethoxazole-trimethoprim (BACTRIM DS) 800-160 MG tablet Nodular prostate - Plan: Ambulatory referral to Urology  -Clinically appears to have enlarged/nodular prostate with secondary acute prostatitis. No sign of retention at present, but ER precautions given  -Start Septra DS, check PSA, refer to urology for further evaluation.  RTC precautions given if acute worsening of symptoms.  -Repeat eval/testing in 2 weeks  Need for prophylactic vaccination and inoculation against influenza   Meds ordered this encounter  Medications  . sulfamethoxazole-trimethoprim (BACTRIM DS) 800-160 MG tablet    Sig: Take 1 tablet by mouth 2 (two) times daily.    Dispense:  28 tablet    Refill:  0   Patient Instructions   Start antibiotics for possible prostate  infection or other urinary tract infection.  I will check a prostate test but also will refer you to urology as prostate exam today suggested possible nodules or enlargement.  Recheck with me in 2 weeks for repeat testing, but if any new or worsening symptoms prior to that time be seen right away here or emergency room as we discussed.  Please let me know if there are questions in the meantime.   Urinary Tract Infection, Adult  A urinary tract infection (UTI) is an infection of any part of the urinary tract. The urinary tract includes the kidneys, ureters, bladder, and urethra. These organs make, store, and get rid of urine in the body. Your health care provider may use other names to describe the infection. An upper UTI affects the ureters and kidneys (pyelonephritis). A lower UTI affects the bladder (cystitis) and urethra (urethritis). What are the causes? Most urinary tract infections are caused by bacteria in your genital area, around the entrance to your urinary tract (urethra). These bacteria grow and cause inflammation of your urinary tract. What increases the risk? You are more likely to develop this condition if:  You have a urinary catheter that stays in place (indwelling).  You are not able to control when you urinate or have a bowel movement (  you have incontinence).  You are male and you: ? Use a spermicide or diaphragm for birth control. ? Have low estrogen levels. ? Are pregnant.  You have certain genes that increase your risk (genetics).  You are sexually active.  You take antibiotic medicines.  You have a condition that causes your flow of urine to slow down, such as: ? An enlarged prostate, if you are male. ? Blockage in your urethra (stricture). ? A kidney stone. ? A nerve condition that affects your bladder control (neurogenic bladder). ? Not getting enough to drink, or not urinating often.  You have certain medical conditions, such as: ? Diabetes. ? A weak  disease-fighting system (immunesystem). ? Sickle cell disease. ? Gout. ? Spinal cord injury. What are the signs or symptoms? Symptoms of this condition include:  Needing to urinate right away (urgently).  Frequent urination or passing small amounts of urine frequently.  Pain or burning with urination.  Blood in the urine.  Urine that smells bad or unusual.  Trouble urinating.  Cloudy urine.  Vaginal discharge, if you are male.  Pain in the abdomen or the lower back. You may also have:  Vomiting or a decreased appetite.  Confusion.  Irritability or tiredness.  A fever.  Diarrhea. The first symptom in older adults may be confusion. In some cases, they may not have any symptoms until the infection has worsened. How is this diagnosed? This condition is diagnosed based on your medical history and a physical exam. You may also have other tests, including:  Urine tests.  Blood tests.  Tests for sexually transmitted infections (STIs). If you have had more than one UTI, a cystoscopy or imaging studies may be done to determine the cause of the infections. How is this treated? Treatment for this condition includes:  Antibiotic medicine.  Over-the-counter medicines to treat discomfort.  Drinking enough water to stay hydrated. If you have frequent infections or have other conditions such as a kidney stone, you may need to see a health care provider who specializes in the urinary tract (urologist). In rare cases, urinary tract infections can cause sepsis. Sepsis is a life-threatening condition that occurs when the body responds to an infection. Sepsis is treated in the hospital with IV antibiotics, fluids, and other medicines. Follow these instructions at home:  Medicines  Take over-the-counter and prescription medicines only as told by your health care provider.  If you were prescribed an antibiotic medicine, take it as told by your health care provider. Do not stop  using the antibiotic even if you start to feel better. General instructions  Make sure you: ? Empty your bladder often and completely. Do not hold urine for long periods of time. ? Empty your bladder after sex. ? Wipe from front to back after a bowel movement if you are male. Use each tissue one time when you wipe.  Drink enough fluid to keep your urine pale yellow.  Keep all follow-up visits as told by your health care provider. This is important. Contact a health care provider if:  Your symptoms do not get better after 1-2 days.  Your symptoms go away and then return. Get help right away if you have:  Severe pain in your back or your lower abdomen.  A fever.  Nausea or vomiting. Summary  A urinary tract infection (UTI) is an infection of any part of the urinary tract, which includes the kidneys, ureters, bladder, and urethra.  Most urinary tract infections are caused by bacteria  in your genital area, around the entrance to your urinary tract (urethra).  Treatment for this condition often includes antibiotic medicines.  If you were prescribed an antibiotic medicine, take it as told by your health care provider. Do not stop using the antibiotic even if you start to feel better.  Keep all follow-up visits as told by your health care provider. This is important. This information is not intended to replace advice given to you by your health care provider. Make sure you discuss any questions you have with your health care provider. Document Released: 04/06/2005 Document Revised: 06/14/2018 Document Reviewed: 01/04/2018 Elsevier Patient Education  [employer].     If you have lab work done today you will be contacted with your lab results within the next 2 weeks.  If you have not heard from [place] then please contact us. The fastest way to get your results is to register for My Chart.   IF you received an x-ray today, you will receive an invoice from [facility].  Please contact [facility] at [phone] with questions or concerns regarding your invoice.   IF you received labwork today, you will receive an invoice from [facility]. Please contact [facility] at [phone] with questions or concerns regarding your invoice.   Our billing staff will not be able to assist you with questions regarding bills from these companies.  You will be contacted with the lab results as soon as they are available. The fastest way to get your results is to activate your My Chart account. Instructions are located on the last page of this paperwork. If you have not heard from [place] regarding the results in 2 weeks, please contact this office.      Signed,   Merri Ray, MD [facility] [place].  03/09/19 1:32 PM

## 2019-03-09 ENCOUNTER — Encounter: Payer: Self-pay | Admitting: Family Medicine

## 2019-03-09 LAB — PSA: Prostate Specific Ag, Serum: 9 ng/mL — ABNORMAL HIGH (ref 0.0–4.0)

## 2019-03-10 LAB — URINE CULTURE

## 2019-03-11 ENCOUNTER — Ambulatory Visit: Payer: Self-pay

## 2019-03-11 ENCOUNTER — Ambulatory Visit: Payer: Medicare HMO | Admitting: Family Medicine

## 2019-03-15 ENCOUNTER — Ambulatory Visit (INDEPENDENT_AMBULATORY_CARE_PROVIDER_SITE_OTHER): Payer: Medicare HMO | Admitting: Family Medicine

## 2019-03-15 VITALS — BP 106/65 | Ht 72.0 in | Wt 221.0 lb

## 2019-03-15 DIAGNOSIS — Z0000 Encounter for general adult medical examination without abnormal findings: Secondary | ICD-10-CM

## 2019-03-15 NOTE — Progress Notes (Signed)
Presents today for [employer] Visit   Date of last exam: 03/08/2019   Interpreter used for this visit? No  I connected with  [name] on 03/15/19 by a video telephone and verified that I am speaking with the correct person using two identifiers.     Patient Care Team: Forrest Moron, MD as PCP - General (Internal Medicine) Adrian Prows, MD as Consulting Physician (Cardiology) Warden Fillers, MD as Consulting Physician (Ophthalmology)   Other items to address today:   Discussed immunizations Discussed Eye/Dental  Follow up scheduled 2/19-2020 Christopher Bauer   Other Screening: Last screening for diabetes: 03/03/2018 Last lipid screening:03/01/2019  ADVANCE DIRECTIVES: Discussed: yes On File: no Materials Provided: yes (mailed)  Immunization status:  Immunization History  Administered Date(s) Administered  . Fluad Quad(high Dose 65+) 03/01/2019  . Influenza Split 04/12/2013  . Influenza,inj,Quad PF,6+ Mos 05/25/2016  . Influenza-Unspecified 04/10/2014, 04/20/2015, 05/11/2018  . Pneumococcal Conjugate-13 05/01/2014  . Pneumococcal Polysaccharide-23 02/04/2016  . Tdap 02/15/2017     There are no preventive care reminders to display for this patient.   Functional Status Survey: Is the patient deaf or have difficulty hearing?: No Does the patient have difficulty seeing, even when wearing glasses/contacts?: No Does the patient have difficulty concentrating, remembering, or making decisions?: No Does the patient have difficulty walking or climbing stairs?: No Does the patient have difficulty dressing or bathing?: No   6CIT Screen 03/15/2019 08/24/2017  What Year? 0 points 0 points  What month? 0 points 0 points  What time? 0 points 0 points  Count back from 20 0 points 0 points  Months in reverse 0 points 4 points  Repeat phrase 0 points 2 points  Total Score 0 6        Clinical Support from 03/15/2019 in [facility] [place]  AUDIT-C  Score  5       Home Environment:   Lives in a one story home   No trouble climbing stairs No scattered rugs No grab bars Adequate lighting/ no clutter   Patient Active Problem List   Diagnosis Date Noted  . Former smoker 08/24/2016  . Coronary artery calcification 02/04/2016  . Abnormal liver function test 10/15/2015  . Abnormal CXR 05/01/2014  . Diabetes mellitus type II 04/13/2012  . High cholesterol 04/13/2012  . Osteoarthritis of both knees 04/13/2012  . Overweight(278.02) 04/13/2012     Past Medical History:  Diagnosis Date  . Arthritis   . Diabetes mellitus without complication ([place])   . Hyperlipidemia   . Hypertension      Past Surgical History:  Procedure Laterality Date  . APPENDECTOMY       Family History  Problem Relation Age of Onset  . Heart disease Mother      Social History   Socioeconomic History  . Marital status: Divorced    Spouse name: Not on file  . Number of children: 2  . Years of education: Not on file  . Highest education level: High school graduate  Occupational History  . Not on file  Social Needs  . Financial resource strain: Not hard at all  . Food insecurity    Worry: Never true    Inability: Never true  . Transportation needs    Medical: No    Non-medical: No  Tobacco Use  . Smoking status: Former Smoker    Packs/day: 0.25    Years: 50.00    Pack years: 12.50    Types: Cigarettes  Quit date: 03/18/2016    Years since quitting: 2.9  . Smokeless tobacco: Never Used  Substance and Sexual Activity  . Alcohol use: Yes    Alcohol/week: 0.0 standard drinks    Comment: 4 beers once a week   . Drug use: No  . Sexual activity: Not on file  Lifestyle  . Physical activity    Days per week: 0 days    Minutes per session: 0 min  . Stress: Not at all  Relationships  . Social connections    Talks on phone: More than three times a week    Gets together: More than three times a week    Attends religious service:  Never    Active member of club or organization: No    Attends meetings of clubs or organizations: Never    Relationship status: Divorced  . Intimate partner violence    Fear of current or ex partner: No    Emotionally abused: No    Physically abused: No    Forced sexual activity: No  Other Topics Concern  . Not on file  Social History Narrative  . Not on file     No Known Allergies   Prior to Admission medications   Medication Sig Start Date End Date Taking? Authorizing Provider  aspirin 81 MG tablet Take 81 mg by mouth daily.   Yes [provider]  atorvastatin (LIPITOR) 40 MG tablet TAKE 1 TABLET DAILY 02/05/19  Yes Stallings, Zoe A, MD  carvedilol (COREG) 6.25 MG tablet TAKE 1 TABLET TWICE DAILY  WITH MEALS 02/05/19  Yes Stallings, Zoe A, MD  lisinopril-hydrochlorothiazide (ZESTORETIC) 20-12.5 MG tablet TAKE 1 TABLET DAILY 02/05/19  Yes Stallings, Zoe A, MD  meloxicam (MOBIC) 15 MG tablet TAKE 1 TABLET DAILY AS     NEEDED FOR PAIN 10/25/18  Yes Stallings, Zoe A, MD  metFORMIN (GLUCOPHAGE) 1000 MG tablet TAKE 1 TABLET TWICE DAILY  WITH MEALS 02/05/19  Yes Stallings, Zoe A, MD  sulfamethoxazole-trimethoprim (BACTRIM DS) 800-160 MG tablet Take 1 tablet by mouth 2 (two) times daily. 03/08/19  Yes Wendie Agreste, MD  terbinafine (LAMISIL) 250 MG tablet Take 1 tablet (250 mg total) by mouth daily. 03/05/19  Yes Forrest Moron, MD  vitamin B-12 (CYANOCOBALAMIN) 1000 MCG tablet Take 1 tablet (1,000 mcg total) by mouth daily. 03/07/19  Yes Forrest Moron, MD     Depression screen [facility] 2/9 03/15/2019 03/08/2019 03/01/2019 09/07/2018 08/24/2017  Decreased Interest 0 0 0 0 0  Down, Depressed, Hopeless 0 0 0 0 0  PHQ - 2 Score 0 0 0 0 0     Fall Risk  03/15/2019 03/08/2019 03/01/2019 09/07/2018 08/24/2017  Falls in the past year? 0 0 0 0 No  Number falls in past yr: 0 0 0 0 -  Injury with Fall? 0 0 0 0 -  Follow up Falls evaluation completed;Education provided;Falls prevention discussed  Falls evaluation completed Falls evaluation completed Falls evaluation completed -      PHYSICAL EXAM: BP 106/65 Comment: taken from previous visit  Ht 6' (1.829 m)   Wt 221 lb (100.2 kg)   BMI 29.97 kg/m    Wt Readings from Last 3 Encounters:  03/15/19 221 lb (100.2 kg)  03/08/19 221 lb 9.6 oz (100.5 kg)  03/01/19 226 lb 12.8 oz (102.9 kg)    Medicare annual wellness visit, subsequent    Physical Exam

## 2019-03-15 NOTE — Patient Instructions (Addendum)
Thank you for taking time to come for your Medicare Wellness Visit. I appreciate your ongoing commitment to your health goals. Please review the following plan we discussed and let me know if I can assist you in the future.  Leroy Kennedy LPN  Preventive Care 84 Years and Older, Male Preventive care refers to lifestyle choices and visits with your health care provider that can promote health and wellness. This includes:  A yearly physical exam. This is also called an annual well check.  Regular dental and eye exams.  Immunizations.  Screening for certain conditions.  Healthy lifestyle choices, such as diet and exercise. What can I expect for my preventive care visit? Physical exam Your health care provider will check:  Height and weight. These may be used to calculate body mass index (BMI), which is a measurement that tells if you are at a healthy weight.  Heart rate and blood pressure.  Your skin for abnormal spots. Counseling Your health care provider may ask you questions about:  Alcohol, tobacco, and drug use.  Emotional well-being.  Home and relationship well-being.  Sexual activity.  Eating habits.  History of falls.  Memory and ability to understand (cognition).  Work and work Statistician. What immunizations do I need?  Influenza (flu) vaccine  This is recommended every year. Tetanus, diphtheria, and pertussis (Tdap) vaccine  You may need a Td booster every 10 years. Varicella (chickenpox) vaccine  You may need this vaccine if you have not already been vaccinated. Zoster (shingles) vaccine  You may need this after age 50. Pneumococcal conjugate (PCV13) vaccine  One dose is recommended after age 53. Pneumococcal polysaccharide (PPSV23) vaccine  One dose is recommended after age 13. Measles, mumps, and rubella (MMR) vaccine  You may need at least one dose of MMR if you were born in 1957 or later. You may also need a second dose. Meningococcal  conjugate (MenACWY) vaccine  You may need this if you have certain conditions. Hepatitis A vaccine  You may need this if you have certain conditions or if you travel or work in places where you may be exposed to hepatitis A. Hepatitis B vaccine  You may need this if you have certain conditions or if you travel or work in places where you may be exposed to hepatitis B. Haemophilus influenzae type b (Hib) vaccine  You may need this if you have certain conditions. You may receive vaccines as individual doses or as more than one vaccine together in one shot (combination vaccines). Talk with your health care provider about the risks and benefits of combination vaccines. What tests do I need? Blood tests  Lipid and cholesterol levels. These may be checked every 5 years, or more frequently depending on your overall health.  Hepatitis C test.  Hepatitis B test. Screening  Lung cancer screening. You may have this screening every year starting at age 47 if you have a 30-pack-year history of smoking and currently smoke or have quit within the past 15 years.  Colorectal cancer screening. All adults should have this screening starting at age 55 and continuing until age 12. Your health care provider may recommend screening at age 7 if you are at increased risk. You will have tests every 1-10 years, depending on your results and the type of screening test.  Prostate cancer screening. Recommendations will vary depending on your family history and other risks.  Diabetes screening. This is done by checking your blood sugar (glucose) after you have not eaten for  a while (fasting). You may have this done every 1-3 years.  Abdominal aortic aneurysm (AAA) screening. You may need this if you are a current or former smoker.  Sexually transmitted disease (STD) testing. Follow these instructions at home: Eating and drinking  Eat a diet that includes fresh fruits and vegetables, whole grains, lean  protein, and low-fat dairy products. Limit your intake of foods with high amounts of sugar, saturated fats, and salt.  Take vitamin and mineral supplements as recommended by your health care provider.  Do not drink alcohol if your health care provider tells you not to drink.  If you drink alcohol: ? Limit how much you have to 0-2 drinks a day. ? Be aware of how much alcohol is in your drink. In the [place] one drink equals one 12 oz bottle of beer (355 mL), one 5 oz glass of wine (148 mL), or one 1 oz glass of hard liquor (44 mL). Lifestyle  Take daily care of your teeth and gums.  Stay active. Exercise for at least 30 minutes on 5 or more days each week.  Do not use any products that contain nicotine or tobacco, such as cigarettes, e-cigarettes, and chewing tobacco. If you need help quitting, ask your health care provider.  If you are sexually active, practice safe sex. Use a condom or other form of protection to prevent STIs (sexually transmitted infections).  Talk with your health care provider about taking a low-dose aspirin or statin. What's next?  Visit your health care provider once a year for a well check visit.  Ask your health care provider how often you should have your eyes and teeth checked.  Stay up to date on all vaccines. This information is not intended to replace advice given to you by your health care provider. Make sure you discuss any questions you have with your health care provider. Document Released: 07/24/2015 Document Revised: 06/21/2018 Document Reviewed: 06/21/2018 Elsevier Patient Education  2020 [employer].

## 2019-03-21 ENCOUNTER — Other Ambulatory Visit: Payer: Self-pay

## 2019-03-21 ENCOUNTER — Ambulatory Visit (INDEPENDENT_AMBULATORY_CARE_PROVIDER_SITE_OTHER): Payer: Medicare HMO | Admitting: Family Medicine

## 2019-03-21 VITALS — BP 99/65 | HR 100 | Temp 98.0°F | Resp 16 | Wt 212.8 lb

## 2019-03-21 DIAGNOSIS — R5383 Other fatigue: Secondary | ICD-10-CM | POA: Diagnosis not present

## 2019-03-21 DIAGNOSIS — E782 Mixed hyperlipidemia: Secondary | ICD-10-CM

## 2019-03-21 DIAGNOSIS — R399 Unspecified symptoms and signs involving the genitourinary system: Secondary | ICD-10-CM

## 2019-03-21 DIAGNOSIS — N419 Inflammatory disease of prostate, unspecified: Secondary | ICD-10-CM | POA: Diagnosis not present

## 2019-03-21 DIAGNOSIS — R197 Diarrhea, unspecified: Secondary | ICD-10-CM | POA: Diagnosis not present

## 2019-03-21 DIAGNOSIS — E1169 Type 2 diabetes mellitus with other specified complication: Secondary | ICD-10-CM | POA: Diagnosis not present

## 2019-03-21 DIAGNOSIS — I959 Hypotension, unspecified: Secondary | ICD-10-CM | POA: Diagnosis not present

## 2019-03-21 LAB — POCT CBC
Granulocyte percent: 8.8 %G — AB (ref 37–80)
HCT, POC: 44.5 % — AB (ref 29–41)
Hemoglobin: 14.9 g/dL — AB (ref 11–14.6)
Lymph, poc: 22.7 — AB (ref 0.6–3.4)
MCH, POC: 29.7 pg (ref 27–31.2)
MCHC: 33.5 g/dL (ref 31.8–35.4)
MCV: 88.7 fL (ref 76–111)
MID (cbc): 75.5 — AB (ref 0–0.9)
MPV: 6.1 fL (ref 0–99.8)
POC Granulocyte: 0.2 — AB (ref 2–6.9)
POC LYMPH PERCENT: 1.8 %L — AB (ref 10–50)
POC MID %: 2.7 %M (ref 0–12)
Platelet Count, POC: 667 10*3/uL — AB (ref 142–424)
RBC: 5.02 M/uL (ref 4.69–6.13)
RDW, POC: 14.1 %
WBC: 11.7 10*3/uL — AB (ref 4.6–10.2)

## 2019-03-21 LAB — POC MICROSCOPIC URINALYSIS (UMFC): Mucus: ABSENT

## 2019-03-21 LAB — GLUCOSE, POCT (MANUAL RESULT ENTRY): POC Glucose: 163 mg/dl — AB (ref 70–99)

## 2019-03-21 LAB — POCT URINALYSIS DIP (MANUAL ENTRY)
Bilirubin, UA: NEGATIVE
Glucose, UA: NEGATIVE mg/dL
Ketones, POC UA: NEGATIVE mg/dL
Leukocytes, UA: NEGATIVE
Nitrite, UA: NEGATIVE
Spec Grav, UA: >=1.03 — AB (ref 1.010–1.025)
Urobilinogen, UA: 0.2 E.U./dL
pH, UA: 5.5 (ref 5.0–8.0)

## 2019-03-21 MED ORDER — CIPROFLOXACIN HCL 500 MG PO TABS: 500.0000 mg | ORAL_TABLET | Freq: Two times a day (BID) | ORAL | 0 refills | Status: DC

## 2019-03-21 MED ORDER — LISINOPRIL 20 MG PO TABS: 20.0000 mg | ORAL_TABLET | Freq: Every day | ORAL | 1 refills | Status: DC

## 2019-03-21 NOTE — Patient Instructions (Addendum)
  Call urology ([facility] [phone]) if they have not called you by next week.  Stop Septra as that may be causing some itching.  Start Cipro 1 pill twice per day.  Watch for new tendon pain as we discussed although that is not common.  Blood pressure is slightly low today and urine was also concentrated so you may be slightly dehydrated.  Do not take the carvedilol tonight.  Increase fluid intake today.  Recheck with me tomorrow morning and then we can decide on restarting medicines, but I did change the lisinopril HCTZ to just lisinopril.  If any lightheadedness dizziness, or worse symptoms tonight go to the emergency room or call 911.  .   If you have lab work done today you will be contacted with your lab results within the next 2 weeks.  If you have not heard from [place] then please contact us. The fastest way to get your results is to register for My Chart.   IF you received an x-ray today, you will receive an invoice from [facility]. Please contact [facility] at [phone] with questions or concerns regarding your invoice.   IF you received labwork today, you will receive an invoice from [place]. Please contact [facility] at [phone] with questions or concerns regarding your invoice.   Our billing staff will not be able to assist you with questions regarding bills from these companies.  You will be contacted with the lab results as soon as they are available. The fastest way to get your results is to activate your My Chart account. Instructions are located on the last page of this paperwork. If you have not heard from [place] regarding the results in 2 weeks, please contact this office.

## 2019-03-21 NOTE — Progress Notes (Signed)
Subjective:    Patient ID: Christopher Bauer, male    DOB: 1945/08/04, 73 y.o.   MRN: [id number]  HPI Christopher Bauer is a 73 y.o. male Presents today for: Chief Complaint  Patient presents with  . Urinary Tract Infection    On last dose of abx and abx make me itch a little   Prostatitis: Treated August 28 with Septra DS twice daily for 2 weeks. Enlarged prostate on exam, but PSA was also elevated at 9.0.  WBC 14.1.  Afebrile. Urine Cx E./coli - pansensitive.  Referred to urology.  Feeling ok. Less discomfort with urinating, but still with frequent urination. 5-7 times per day.  No penile d/c, no testicular pain.  No rash, but occasional itching on arms. . No new side effects. No prior sulfa allergy known.  No fever, no abd pain. No retention - able to urinate.   Prior normal PSA.  Lab Results  Component Value Date   PSA 0.77 10/15/2015   PSA 0.81 04/26/2013    Hypertension: BP Readings from Last 3 Encounters:  03/21/19 99/65  03/15/19 106/65  03/08/19 (!) 143/77   Lab Results  Component Value Date   CREATININE 0.95 03/01/2019  Currently on Coreg, lisinopril HCTZ.  Blood pressure borderline last visit, lower today.  Hemoglobin normal at 14.1 last visit. Past few days - slight lightheaded, slight fatigue. No syncope/near syncope.  No chills, no fever.  Drinking plenty of fluids.  No home BP readings.  On metformin for diabetes - no recent home readings.   At end of visit - notes he has had loose/watery stools plast week to 10 days about 5-6 per day. No blood. No abd pain.   Patient Active Problem List   Diagnosis Date Noted  . Former smoker 08/24/2016  . Coronary artery calcification 02/04/2016  . Abnormal liver function test 10/15/2015  . Abnormal CXR 05/01/2014  . Diabetes mellitus type II 04/13/2012  . High cholesterol 04/13/2012  . Osteoarthritis of both knees 04/13/2012  . Overweight(278.02) 04/13/2012   Past Medical History:  Diagnosis Date  . Arthritis   .  Diabetes mellitus without complication ([place])   . Hyperlipidemia   . Hypertension    Past Surgical History:  Procedure Laterality Date  . APPENDECTOMY     Allergies  Allergen Reactions  . Sulfa Antibiotics Itching   Prior to Admission medications   Medication Sig Start Date End Date Taking? Authorizing Provider  aspirin 81 MG tablet Take 81 mg by mouth daily.   Yes [provider]  atorvastatin (LIPITOR) 40 MG tablet TAKE 1 TABLET DAILY 02/05/19  Yes Stallings, Zoe A, MD  carvedilol (COREG) 6.25 MG tablet TAKE 1 TABLET TWICE DAILY  WITH MEALS 02/05/19  Yes Stallings, Zoe A, MD  lisinopril-hydrochlorothiazide (ZESTORETIC) 20-12.5 MG tablet TAKE 1 TABLET DAILY 02/05/19  Yes Stallings, Zoe A, MD  metFORMIN (GLUCOPHAGE) 1000 MG tablet TAKE 1 TABLET TWICE DAILY  WITH MEALS 02/05/19  Yes Stallings, Zoe A, MD  sulfamethoxazole-trimethoprim (BACTRIM DS) 800-160 MG tablet Take 1 tablet by mouth 2 (two) times daily. 03/08/19  Yes Wendie Agreste, MD  terbinafine (LAMISIL) 250 MG tablet Take 1 tablet (250 mg total) by mouth daily. 03/05/19  Yes Forrest Moron, MD  vitamin B-12 (CYANOCOBALAMIN) 1000 MCG tablet Take 1 tablet (1,000 mcg total) by mouth daily. 03/07/19  Yes Forrest Moron, MD   Social History   Socioeconomic History  . Marital status: Divorced    Spouse name: Not on file  .  Number of children: 2  . Years of education: Not on file  . Highest education level: High school graduate  Occupational History  . Not on file  Social Needs  . Financial resource strain: Not hard at all  . Food insecurity    Worry: Never true    Inability: Never true  . Transportation needs    Medical: No    Non-medical: No  Tobacco Use  . Smoking status: Former Smoker    Packs/day: 0.25    Years: 50.00    Pack years: 12.50    Types: Cigarettes    Quit date: 03/18/2016    Years since quitting: 3.0  . Smokeless tobacco: Never Used  Substance and Sexual Activity  . Alcohol use: Yes     Alcohol/week: 0.0 standard drinks    Comment: 4 beers once a week   . Drug use: No  . Sexual activity: Not on file  Lifestyle  . Physical activity    Days per week: 0 days    Minutes per session: 0 min  . Stress: Not at all  Relationships  . Social connections    Talks on phone: More than three times a week    Gets together: More than three times a week    Attends religious service: Never    Active member of club or organization: No    Attends meetings of clubs or organizations: Never    Relationship status: Divorced  . Intimate partner violence    Fear of current or ex partner: No    Emotionally abused: No    Physically abused: No    Forced sexual activity: No  Other Topics Concern  . Not on file  Social History Narrative  . Not on file    Review of Systems Per HPI.     Objective:   Physical Exam Vitals signs reviewed.  Constitutional:      General: He is not in acute distress.    Appearance: He is well-developed. He is not ill-appearing, toxic-appearing or diaphoretic.  HENT:     Head: Normocephalic and atraumatic.  Eyes:     Pupils: Pupils are equal, round, and reactive to light.  Neck:     Vascular: No carotid bruit or JVD.  Cardiovascular:     Rate and Rhythm: Normal rate and regular rhythm.     Heart sounds: Normal heart sounds. No murmur.  Pulmonary:     Effort: Pulmonary effort is normal.     Breath sounds: Normal breath sounds. No rales.  Abdominal:     General: Abdomen is flat. Bowel sounds are normal.     Palpations: Abdomen is soft.     Tenderness: There is no abdominal tenderness. There is no right CVA tenderness, guarding or rebound.  Skin:    General: Skin is warm and dry.     Comments: Few abraded areas on forearms, no rash.   Neurological:     General: No focal deficit present.     Mental Status: He is alert and oriented to person, place, and time.  Psychiatric:        Mood and Affect: Mood normal.    Vitals:   03/21/19 1010 03/21/19  1011  BP: 96/62 99/65  Pulse: 100   Resp: 16   Temp: 98 F (36.7 C)   TempSrc: Oral   SpO2: 96%   Weight: 212 lb 12.8 oz (96.5 kg)      Results for orders placed or performed in visit on 03/21/19  POCT urinalysis dipstick  Result Value Ref Range   Color, UA yellow yellow   Clarity, UA clear clear   Glucose, UA negative negative mg/dL   Bilirubin, UA negative negative   Ketones, POC UA negative negative mg/dL   Spec Grav, UA >=1.030 (A) 1.010 - 1.025   Blood, UA trace-intact (A) negative   pH, UA 5.5 5.0 - 8.0   Protein Ur, POC trace (A) negative mg/dL   Urobilinogen, UA 0.2 0.2 or 1.0 E.U./dL   Nitrite, UA Negative Negative   Leukocytes, UA Negative Negative  POCT Microscopic Urinalysis ([facility])  Result Value Ref Range   WBC,UR,HPF,POC Few (A) None WBC/hpf   RBC,UR,HPF,POC Many (A) None RBC/hpf   Bacteria Moderate (A) None, Too numerous to count   Mucus Absent Absent   Epithelial Cells, UR Per Microscopy Few (A) None, Too numerous to count cells/hpf  POCT glucose (manual entry)  Result Value Ref Range   POC Glucose 163 (A) 70 - 99 mg/dl  POCT CBC  Result Value Ref Range   WBC 11.7 (A) 4.6 - 10.2 K/uL   Lymph, poc 22.7 (A) 0.6 - 3.4   POC LYMPH PERCENT 1.8 (A) 10 - 50 %L   MID (cbc) 75.5 (A) 0 - 0.9   POC MID % 2.7 0 - 12 %M   POC Granulocyte 0.2 (A) 2 - 6.9   Granulocyte percent 8.8 (A) 37 - 80 %G   RBC 5.02 4.69 - 6.13 M/uL   Hemoglobin 14.9 (A) 11 - 14.6 g/dL   HCT, POC 44.5 (A) 29 - 41 %   MCV 88.7 76 - 111 fL   MCH, POC 29.7 27 - 31.2 pg   MCHC 33.5 31.8 - 35.4 g/dL   RDW, POC 14.1 %   Platelet Count, POC 667 (A) 142 - 424 K/uL   MPV 6.1 0 - 99.8 fL   Orthostatic VS for the past 24 hrs (Last 3 readings):  BP- Lying Pulse- Lying BP- Standing at 0 minutes Pulse- Standing at 0 minutes BP- Standing at 3 minutes Pulse- Standing at 3 minutes  03/21/19 1126 90/58 66 (!) 79/53 86 94/61 85   .     Assessment & Plan:   Christopher Bauer is a 73 y.o. male  Prostatitis, unspecified prostatitis type - Plan: POCT CBC, PSA, ciprofloxacin (CIPRO) 500 MG tablet  Urinary tract infection symptoms - Plan: POCT urinalysis dipstick, POCT Microscopic Urinalysis ([facility]), Orthostatic vital signs  Fatigue, unspecified type - Plan: POCT glucose (manual entry), POCT CBC, Basic metabolic panel  DM type 2 with diabetic mixed hyperlipidemia ([place]) - Plan: POCT glucose (manual entry)  Hypotension, unspecified hypotension type - Plan: lisinopril (ZESTRIL) 20 MG tablet  Diarrhea, unspecified type - Plan: Clostridium Difficile by PCR  Overall improvement in prostatitis symptoms.  No abdominal pain, afebrile.  Hypotensive in office, did take antihypertensives this morning.  Noted to have diarrhea during course of illness.  Some increased fatigue past few days.  Offered ER evaluation, but will plan for close follow-up in 24 hours with repeat testing.  -Hold antihypertensives for now, push fluids.  Once meds restarted, plan for just lisinopril, hold HCTZ.  -Change Bactrim to Cipro given itching.  Tendinopathy risk discussed  -With hypertension and suspected volume depletion, check BMP.  -Plan for follow-up with urology given suspected enlarged prostate in addition to prostatitis  -Overnight ER precautions reviewed.  Meds ordered this encounter  Medications  . lisinopril (ZESTRIL) 20 MG tablet    Sig: Take 1  tablet (20 mg total) by mouth daily.    Dispense:  90 tablet    Refill:  1  . ciprofloxacin (CIPRO) 500 MG tablet    Sig: Take 1 tablet (500 mg total) by mouth 2 (two) times daily.    Dispense:  28 tablet    Refill:  0   Patient Instructions    Call urology ([facility] [phone]) if they have not called you by next week.  Stop Septra as that may be causing some itching.  Start Cipro 1 pill twice per day.  Watch for new tendon pain as we discussed although that is not common.  Blood pressure is slightly low today and urine was also concentrated so you  may be slightly dehydrated.  Do not take the carvedilol tonight.  Increase fluid intake today.  Recheck with me tomorrow morning and then we can decide on restarting medicines, but I did change the lisinopril HCTZ to just lisinopril.  If any lightheadedness dizziness, or worse symptoms tonight go to the emergency room or call 911.  .   If you have lab work done today you will be contacted with your lab results within the next 2 weeks.  If you have not heard from [place] then please contact us. The fastest way to get your results is to register for My Chart.   IF you received an x-ray today, you will receive an invoice from [facility]. Please contact [facility] at [phone] with questions or concerns regarding your invoice.   IF you received labwork today, you will receive an invoice from [place]. Please contact [facility] at [phone] with questions or concerns regarding your invoice.   Our billing staff will not be able to assist you with questions regarding bills from these companies.  You will be contacted with the lab results as soon as they are available. The fastest way to get your results is to activate your My Chart account. Instructions are located on the last page of this paperwork. If you have not heard from [place] regarding the results in 2 weeks, please contact this office.       Signed,   Merri Ray, MD [facility] [place].  03/22/19 1:23 PM

## 2019-03-22 ENCOUNTER — Encounter: Payer: Self-pay | Admitting: Family Medicine

## 2019-03-22 ENCOUNTER — Encounter (HOSPITAL_COMMUNITY): Payer: Self-pay

## 2019-03-22 ENCOUNTER — Ambulatory Visit (INDEPENDENT_AMBULATORY_CARE_PROVIDER_SITE_OTHER): Payer: Medicare HMO | Admitting: Family Medicine

## 2019-03-22 ENCOUNTER — Other Ambulatory Visit: Payer: Self-pay

## 2019-03-22 ENCOUNTER — Emergency Department (HOSPITAL_COMMUNITY)
Admission: EM | Admit: 2019-03-22 | Discharge: 2019-03-22 | Disposition: A | Discharge status: Home / Self Care | Payer: Medicare HMO | Attending: Emergency Medicine | Admitting: Emergency Medicine

## 2019-03-22 VITALS — BP 112/65 | HR 89 | Temp 97.6°F | Resp 12 | Wt 213.6 lb

## 2019-03-22 DIAGNOSIS — E1169 Type 2 diabetes mellitus with other specified complication: Secondary | ICD-10-CM

## 2019-03-22 DIAGNOSIS — E875 Hyperkalemia: Secondary | ICD-10-CM | POA: Diagnosis not present

## 2019-03-22 DIAGNOSIS — Z7982 Long term (current) use of aspirin: Secondary | ICD-10-CM | POA: Insufficient documentation

## 2019-03-22 DIAGNOSIS — E782 Mixed hyperlipidemia: Secondary | ICD-10-CM

## 2019-03-22 DIAGNOSIS — E878 Other disorders of electrolyte and fluid balance, not elsewhere classified: Secondary | ICD-10-CM | POA: Diagnosis not present

## 2019-03-22 DIAGNOSIS — E871 Hypo-osmolality and hyponatremia: Secondary | ICD-10-CM | POA: Diagnosis not present

## 2019-03-22 DIAGNOSIS — Z79899 Other long term (current) drug therapy: Secondary | ICD-10-CM | POA: Diagnosis not present

## 2019-03-22 DIAGNOSIS — A0472 Enterocolitis due to Clostridium difficile, not specified as recurrent: Secondary | ICD-10-CM

## 2019-03-22 DIAGNOSIS — Z7984 Long term (current) use of oral hypoglycemic drugs: Secondary | ICD-10-CM | POA: Insufficient documentation

## 2019-03-22 DIAGNOSIS — I10 Essential (primary) hypertension: Secondary | ICD-10-CM | POA: Diagnosis not present

## 2019-03-22 DIAGNOSIS — N419 Inflammatory disease of prostate, unspecified: Secondary | ICD-10-CM

## 2019-03-22 DIAGNOSIS — Z87891 Personal history of nicotine dependence: Secondary | ICD-10-CM | POA: Diagnosis not present

## 2019-03-22 DIAGNOSIS — R799 Abnormal finding of blood chemistry, unspecified: Secondary | ICD-10-CM | POA: Diagnosis present

## 2019-03-22 DIAGNOSIS — I959 Hypotension, unspecified: Secondary | ICD-10-CM | POA: Diagnosis not present

## 2019-03-22 DIAGNOSIS — E119 Type 2 diabetes mellitus without complications: Secondary | ICD-10-CM | POA: Diagnosis not present

## 2019-03-22 HISTORY — DX: Inflammatory disease of prostate, unspecified: N41.9

## 2019-03-22 LAB — BASIC METABOLIC PANEL
Anion gap: 12 (ref 5–15)
BUN/Creatinine Ratio: 22 (ref 10–24)
BUN/Creatinine Ratio: 21 (ref 10–24)
BUN: 22 mg/dL (ref 8–27)
BUN: 22 mg/dL (ref 8–27)
BUN: 18 mg/dL (ref 8–23)
CO2: 15 mmol/L — CL (ref 20–29)
CO2: 19 mmol/L — ABNORMAL LOW (ref 20–29)
CO2: 16 mmol/L — ABNORMAL LOW (ref 22–32)
Calcium: 10.5 mg/dL — ABNORMAL HIGH (ref 8.6–10.2)
Calcium: 10.3 mg/dL — ABNORMAL HIGH (ref 8.6–10.2)
Calcium: 9.2 mg/dL (ref 8.9–10.3)
Chloride: 97 mmol/L (ref 96–106)
Chloride: 97 mmol/L (ref 96–106)
Chloride: 100 mmol/L (ref 98–111)
Creatinine, Ser: 1.01 mg/dL (ref 0.76–1.27)
Creatinine, Ser: 1.05 mg/dL (ref 0.76–1.27)
Creatinine, Ser: 0.9 mg/dL (ref 0.61–1.24)
GFR calc Af Amer: 84 mL/min/{1.73_m2} (ref >59)
GFR calc Af Amer: 80 mL/min/{1.73_m2} (ref >59)
GFR calc Af Amer: >60 mL/min (ref >60)
GFR calc non Af Amer: 73 mL/min/{1.73_m2} (ref >59)
GFR calc non Af Amer: 70 mL/min/{1.73_m2} (ref >59)
GFR calc non Af Amer: >60 mL/min (ref >60)
Glucose, Bld: 109 mg/dL — ABNORMAL HIGH (ref 70–99)
Glucose: 158 mg/dL — ABNORMAL HIGH (ref 65–99)
Glucose: 157 mg/dL — ABNORMAL HIGH (ref 65–99)
Potassium: 5.6 mmol/L — ABNORMAL HIGH (ref 3.5–5.2)
Potassium: 5.3 mmol/L — ABNORMAL HIGH (ref 3.5–5.2)
Potassium: 5.2 mmol/L — ABNORMAL HIGH (ref 3.5–5.1)
Sodium: 127 mmol/L — ABNORMAL LOW (ref 134–144)
Sodium: 124 mmol/L — ABNORMAL LOW (ref 134–144)
Sodium: 128 mmol/L — ABNORMAL LOW (ref 135–145)

## 2019-03-22 LAB — POCT CBC
Granulocyte percent: 60.2 %G (ref 37–80)
HCT, POC: 40.9 % (ref 29–41)
Hemoglobin: 14.3 g/dL (ref 11–14.6)
Lymph, poc: 3.2 (ref 0.6–3.4)
MCH, POC: 30.8 pg (ref 27–31.2)
MCHC: 34.9 g/dL (ref 31.8–35.4)
MCV: 88.3 fL (ref 76–111)
MID (cbc): 0.7 (ref 0–0.9)
MPV: 6 fL (ref 0–99.8)
POC Granulocyte: 5.8 (ref 2–6.9)
POC LYMPH PERCENT: 33 %L (ref 10–50)
POC MID %: 6.8 %M (ref 0–12)
Platelet Count, POC: 546 10*3/uL — AB (ref 142–424)
RBC: 4.63 M/uL — AB (ref 4.69–6.13)
RDW, POC: 13.6 %
WBC: 9.7 10*3/uL (ref 4.6–10.2)

## 2019-03-22 LAB — URINALYSIS, ROUTINE W REFLEX MICROSCOPIC
Bilirubin Urine: NEGATIVE
Glucose, UA: NEGATIVE mg/dL
Hgb urine dipstick: NEGATIVE
Ketones, ur: NEGATIVE mg/dL
Leukocytes,Ua: NEGATIVE
Nitrite: NEGATIVE
Protein, ur: NEGATIVE mg/dL
Specific Gravity, Urine: 1.01 (ref 1.005–1.030)
pH: 5 (ref 5.0–8.0)

## 2019-03-22 LAB — CBC WITH DIFFERENTIAL/PLATELET
Abs Immature Granulocytes: 0.02 10*3/uL (ref 0.00–0.07)
Basophils Absolute: 0 10*3/uL (ref 0.0–0.1)
Basophils Relative: 0 %
Eosinophils Absolute: 0.1 10*3/uL (ref 0.0–0.5)
Eosinophils Relative: 1 %
HCT: 45.2 % (ref 39.0–52.0)
Hemoglobin: 14.7 g/dL (ref 13.0–17.0)
Immature Granulocytes: 0 %
Lymphocytes Relative: 33 %
Lymphs Abs: 2.9 10*3/uL (ref 0.7–4.0)
MCH: 29.6 pg (ref 26.0–34.0)
MCHC: 32.5 g/dL (ref 30.0–36.0)
MCV: 90.9 fL (ref 80.0–100.0)
Monocytes Absolute: 0.7 10*3/uL (ref 0.1–1.0)
Monocytes Relative: 8 %
Neutro Abs: 5.1 10*3/uL (ref 1.7–7.7)
Neutrophils Relative %: 58 %
Platelets: 522 10*3/uL — ABNORMAL HIGH (ref 150–400)
RBC: 4.97 MIL/uL (ref 4.22–5.81)
RDW: 13.2 % (ref 11.5–15.5)
WBC: 8.9 10*3/uL (ref 4.0–10.5)
nRBC: 0 % (ref 0.0–0.2)

## 2019-03-22 LAB — PSA: Prostate Specific Ag, Serum: 2.8 ng/mL (ref 0.0–4.0)

## 2019-03-22 LAB — CLOSTRIDIUM DIFFICILE BY PCR: Toxigenic C. Difficile by PCR: POSITIVE — AB

## 2019-03-22 LAB — GLUCOSE, POCT (MANUAL RESULT ENTRY): POC Glucose: 138 mg/dl — AB (ref 70–99)

## 2019-03-22 MED ORDER — SODIUM CHLORIDE 0.9 % IV BOLUS
500.0000 mL | Freq: Once | INTRAVENOUS | Status: AC
  Administered 2019-03-22: 500 mL via INTRAVENOUS

## 2019-03-22 MED ORDER — VANCOMYCIN HCL 125 MG PO CAPS: 125.0000 mg | ORAL_CAPSULE | Freq: Four times a day (QID) | ORAL | 0 refills | Status: DC

## 2019-03-22 NOTE — ED Provider Notes (Signed)
[facility] DEPT Provider Note   CSN: [id number] Arrival date & time: 03/22/19  1457     History   Chief Complaint Chief Complaint  Patient presents with   Abnormal Lab    HPI Christopher Bauer is a 73 y.o. male.     Patient is a 73 year old male who presents with an abnormal lab.  He states that he was called by his PCP with a low sodium level and told to come to the emergency room.  He has a history of diabetes, hypertension and hyperlipidemia.  He recently has been treated for a urinary tract infection with 2 different antibiotics.  He is feeling better from that.  Since he has been on the antibiotics for the last week and a half he has had diarrhea about 6-7 times a day.  He says over the last 24 hours it has eased off.  He was feeling pretty weak and tired but actually since yesterday has felt a little bit better.  No fevers.  No abdominal pain.  No dizziness.  No chest pain or shortness of breath.  No cough or cold symptoms.  His sodium was checked yesterday and was 127.  Today it was 124.     Past Medical History:  Diagnosis Date   Arthritis    Diabetes mellitus without complication ([place])    Hyperlipidemia    Hypertension    Prostatitis     Patient Active Problem List   Diagnosis Date Noted   Former smoker 08/24/2016   Coronary artery calcification 02/04/2016   Abnormal liver function test 10/15/2015   Abnormal CXR 05/01/2014   Diabetes mellitus type II 04/13/2012   High cholesterol 04/13/2012   Osteoarthritis of both knees 04/13/2012   Overweight(278.02) 04/13/2012    Past Surgical History:  Procedure Laterality Date   APPENDECTOMY          Home Medications    Prior to Admission medications   Medication Sig Start Date End Date Taking? Authorizing Provider  aspirin 81 MG tablet Take 81 mg by mouth daily.   Yes [provider]  atorvastatin (LIPITOR) 40 MG tablet TAKE 1 TABLET DAILY 02/05/19  Yes  Stallings, Zoe A, MD  carvedilol (COREG) 6.25 MG tablet TAKE 1 TABLET TWICE DAILY  WITH MEALS 02/05/19  Yes Stallings, Zoe A, MD  ciprofloxacin (CIPRO) 500 MG tablet Take 1 tablet (500 mg total) by mouth 2 (two) times daily. 03/21/19  Yes Wendie Agreste, MD  lisinopril (ZESTRIL) 20 MG tablet Take 1 tablet (20 mg total) by mouth daily. 03/21/19  Yes Wendie Agreste, MD  metFORMIN (GLUCOPHAGE) 1000 MG tablet TAKE 1 TABLET TWICE DAILY  WITH MEALS 02/05/19  Yes Stallings, Zoe A, MD  terbinafine (LAMISIL) 250 MG tablet Take 1 tablet (250 mg total) by mouth daily. 03/05/19  Yes Forrest Moron, MD  vitamin B-12 (CYANOCOBALAMIN) 1000 MCG tablet Take 1 tablet (1,000 mcg total) by mouth daily. 03/07/19  Yes Forrest Moron, MD  vancomycin (VANCOCIN) 125 MG capsule Take 1 capsule (125 mg total) by mouth 4 (four) times daily. 03/22/19   Malvin Johns, MD    Family History Family History  Problem Relation Age of Onset   Heart disease Mother     Social History Social History   Tobacco Use   Smoking status: Former Smoker    Packs/day: 0.25    Years: 50.00    Pack years: 12.50    Types: Cigarettes    Quit date: 03/18/2016  Years since quitting: 3.0   Smokeless tobacco: Never Used  Substance Use Topics   Alcohol use: Yes    Alcohol/week: 0.0 standard drinks    Comment: 4 beers once a week    Drug use: No     Allergies   Sulfa antibiotics   Review of Systems Review of Systems  Constitutional: Positive for fatigue. Negative for chills, diaphoresis and fever.  HENT: Negative for congestion, rhinorrhea and sneezing.   Eyes: Negative.   Respiratory: Negative for cough, chest tightness and shortness of breath.   Cardiovascular: Negative for chest pain and leg swelling.  Gastrointestinal: Positive for diarrhea. Negative for abdominal pain, blood in stool, nausea and vomiting.  Genitourinary: Negative for difficulty urinating, flank pain, frequency and hematuria.  Musculoskeletal:  Negative for arthralgias and back pain.  Skin: Negative for rash.  Neurological: Negative for dizziness, speech difficulty, weakness, numbness and headaches.     Physical Exam Updated Vital Signs BP 131/71    Pulse 75    Temp 98.4 F (36.9 C) (Oral)    Resp 20    Ht 6' (1.829 m)    Wt 96.2 kg    SpO2 100%    BMI 28.75 kg/m   Physical Exam Constitutional:      Appearance: He is well-developed.  HENT:     Head: Normocephalic and atraumatic.  Eyes:     Pupils: Pupils are equal, round, and reactive to light.  Neck:     Musculoskeletal: Normal range of motion and neck supple.  Cardiovascular:     Rate and Rhythm: Normal rate and regular rhythm.     Heart sounds: Normal heart sounds.  Pulmonary:     Effort: Pulmonary effort is normal. No respiratory distress.     Breath sounds: Normal breath sounds. No wheezing or rales.  Chest:     Chest wall: No tenderness.  Abdominal:     General: Bowel sounds are normal.     Palpations: Abdomen is soft.     Tenderness: There is no abdominal tenderness. There is no guarding or rebound.  Musculoskeletal: Normal range of motion.  Lymphadenopathy:     Cervical: No cervical adenopathy.  Skin:    General: Skin is warm and dry.     Findings: No rash.  Neurological:     Mental Status: He is alert and oriented to person, place, and time.      ED Treatments / Results  Labs (all labs ordered are listed, but only abnormal results are displayed) Labs Reviewed  CBC WITH DIFFERENTIAL/PLATELET - Abnormal; Notable for the following components:      Result Value   Platelets 522 (*)    All other components within normal limits  BASIC METABOLIC PANEL - Abnormal; Notable for the following components:   Sodium 128 (*)    Potassium 5.2 (*)    CO2 16 (*)    Glucose, Bld 109 (*)    All other components within normal limits  URINALYSIS, ROUTINE W REFLEX MICROSCOPIC    EKG EKG Interpretation  Date/Time:  Friday March 22 2019 19:04:51  EDT Ventricular Rate:  73 PR Interval:    QRS Duration: 144 QT Interval:  367 QTC Calculation: 405 R Axis:   72 Text Interpretation:  Sinus or ectopic atrial rhythm Right bundle branch block No old tracing to compare Confirmed by Malvin Johns [phone]) on 03/22/2019 8:02:36 PM   Radiology No results found.  Procedures Procedures (including critical care time)  Medications Ordered in ED Medications  sodium chloride 0.9 % bolus 500 mL (500 mLs Intravenous New Bag/Given 03/22/19 2153)     Initial Impression / Assessment and Plan / ED Course  I have reviewed the triage vital signs and the nursing notes.  Pertinent labs & imaging results that were available during my care of the patient were reviewed by me and considered in my medical decision making (see chart for details).        Patient is a 73 year old male who presents with a low sodium.  It was noted to be 124 by his PCP today.  He actually is feeling much better than he has in the last few days and is currently asymptomatic.  His diarrhea has eased off and he has not had any episodes today.  I did note that he had a positive C. difficile test that has just resulted so I will go ahead and start him on oral vancomycin.  His sodium in the ED is 128.  He was given some IV fluids.  His other labs are non-concerning.  I feel that he is appropriate for discharge and he is comfortable with this.  He was discharged home in good condition.  I did advise him that he needs to follow-up with his PCP on Monday to have his sodium rechecked and he needs to return to the emergency room if he has any worsening symptoms including worsening diarrhea.  Final Clinical Impressions(s) / ED Diagnoses   Final diagnoses:  Hyponatremia  C. difficile colitis    ED Discharge Orders         Ordered    vancomycin (VANCOCIN) 125 MG capsule  4 times daily     03/22/19 2201           Malvin Johns, MD 03/22/19 2203

## 2019-03-22 NOTE — Discharge Instructions (Signed)
You need to have your sodium rechecked by your primary care doctor within a few days.  Start taking the antibiotics for your C. difficile infection.  Return to the emergency room if you have any worsening symptoms.

## 2019-03-22 NOTE — Patient Instructions (Addendum)
Continue Cipro.  I will watch for results of stool test. Be seen if diarrhea worsens.  Continue to increase fluids.  Keep a record of your blood pressures outside of the office at least twice per day, and if they run over 140/90 - restart carvedilol only. Depending on control next week can decide on lisinopril.  Recheck in 5 days.   - go to ER for eval of low sodium today.   How to Take Your Blood Pressure Blood pressure is a measurement of how strongly your blood is pressing against the walls of your arteries. Arteries are blood vessels that carry blood from your heart throughout your body. Your health care provider takes your blood pressure at each office visit. You can also take your own blood pressure at home with a blood pressure machine. You may need to take your own blood pressure:  To confirm a diagnosis of high blood pressure (hypertension).  To monitor your blood pressure over time.  To make sure your blood pressure medicine is working. Supplies needed: To take your blood pressure, you will need a blood pressure machine. You can buy a blood pressure machine, or blood pressure monitor, at most drugstores or online. There are several types of home blood pressure monitors. When choosing one, consider the following:  Choose a monitor that has an arm cuff.  Choose a cuff that wraps snugly around your upper arm. You should be able to fit only one finger between your arm and the cuff.  Do not choose a monitor that measures your blood pressure from your wrist or finger. Your health care provider can suggest a reliable monitor that will meet your needs. How to prepare To get the most accurate reading, avoid the following for 30 minutes before you check your blood pressure:  Drinking caffeine.  Drinking alcohol.  Eating.  Smoking.  Exercising. Five minutes before you check your blood pressure:  Empty your bladder.  Sit quietly without talking in a dining chair, rather than in  a soft couch or armchair. How to take your blood pressure To check your blood pressure, follow the instructions in the manual that came with your blood pressure monitor. If you have a digital blood pressure monitor, the instructions may be as follows: 1. Sit up straight. 2. Place your feet on the floor. Do not cross your ankles or legs. 3. Rest your left arm at the level of your heart on a table or desk or on the arm of a chair. 4. Pull up your shirt sleeve. 5. Wrap the blood pressure cuff around the upper part of your left arm, 1 inch (2.5 cm) above your elbow. It is best to wrap the cuff around bare skin. 6. Fit the cuff snugly around your arm. You should be able to place only one finger between the cuff and your arm. 7. Position the cord inside the groove of your elbow. 8. Press the power button. 9. Sit quietly while the cuff inflates and deflates. 10. Read the digital reading on the monitor screen and write it down (record it). 11. Wait 2-3 minutes, then repeat the steps, starting at step 1. What does my blood pressure reading mean? A blood pressure reading consists of a higher number over a lower number. Ideally, your blood pressure should be below 120/80. The first ("top") number is called the systolic pressure. It is a measure of the pressure in your arteries as your heart beats. The second ("bottom") number is called the diastolic pressure.  It is a measure of the pressure in your arteries as the heart relaxes. Blood pressure is classified into four stages. The following are the stages for adults who do not have a short-term serious illness or a chronic condition. Systolic pressure and diastolic pressure are measured in a unit called mm Hg. Normal  Systolic pressure: below 123456.  Diastolic pressure: below 80. Elevated  Systolic pressure: [id number].  Diastolic pressure: below 80. Hypertension stage 1  Systolic pressure: 0000000.  Diastolic pressure: [id number]. Hypertension stage  2  Systolic pressure: [id number] or above.  Diastolic pressure: 90 or above. You can have prehypertension or hypertension even if only the systolic or only the diastolic number in your reading is higher than normal. Follow these instructions at home:  Check your blood pressure as often as recommended by your health care provider.  Take your monitor to the next appointment with your health care provider to make sure: ? That you are using it correctly. ? That it provides accurate readings.  Be sure you understand what your goal blood pressure numbers are.  Tell your health care provider if you are having any side effects from blood pressure medicine. Contact a health care provider if:  Your blood pressure is consistently high. Get help right away if:  Your systolic blood pressure is higher than 180.  Your diastolic blood pressure is higher than 110. This information is not intended to replace advice given to you by your health care provider. Make sure you discuss any questions you have with your health care provider. Document Released: 12/04/2015 Document Revised: 06/09/2017 Document Reviewed: 12/04/2015 Elsevier Patient Education  [employer].   If you have lab work done today you will be contacted with your lab results within the next 2 weeks.  If you have not heard from [place] then please contact us. The fastest way to get your results is to register for My Chart.   IF you received an x-ray today, you will receive an invoice from [facility]. Please contact [facility] at [phone] with questions or concerns regarding your invoice.   IF you received labwork today, you will receive an invoice from [facility]. Please contact [facility] at [phone] with questions or concerns regarding your invoice.   Our billing staff will not be able to assist you with questions regarding bills from these companies.  You will be contacted with the lab results as soon as they are  available. The fastest way to get your results is to activate your My Chart account. Instructions are located on the last page of this paperwork. If you have not heard from [place] regarding the results in 2 weeks, please contact this office.

## 2019-03-22 NOTE — ED Triage Notes (Signed)
Patient states he was called by his PCP and was told he had a low sodium level.( Na-124) Patient states he is currently being treated for a UTI.

## 2019-03-22 NOTE — ED Notes (Signed)
Amount of blood in BMP insufficient. Blood redrawn for BMP.

## 2019-03-22 NOTE — Progress Notes (Signed)
Subjective:    Patient ID: Christopher Bauer, male    DOB: [birth year], 73 y.o.   MRN: [id number]  HPI  Christopher Bauer is a 73 y.o. male Presents today for: Chief Complaint  Patient presents with  . hypotensive    Patient states he been drinking plenty of fluids yday.    Prostatitis: Initially treated September 10 with Septra DS twice per day, pansensitive E. coli on urine culture, PSA 9.0.  Leukocytosis with WBC 14.1, afebrile.  Some improvement in urinary symptoms noted at yesterday's visit, no retention symptoms.  Leukocytosis improved at 11.7. no fever/chills. PSA down to 2.8.  No abdominal pain.  Some itching with Septra, primarily arms.  Noted to be relatively hypotensive yesterday.  Did note diarrhea 4-5 times per day.  Suspected volume depletion.  Antihypertensives held.  Septra changed to Cipro 500 mg twice daily. C diff testing ordered.   Critical lab call received early this morning with CO2 of 15.  Noted also to have hyponatremia, sodium 127 hyperkalemia potassium 5.6.  Most recent electrolytes on August 21 were normal with the exception of glucose 143.  He has increased his fluid intake since yesterday. Feeling better today - more energy.  2 BM since yesterday - more formed this am. No fever.  No HA/lightheadedness or dizziness.  Nocturia x 4 last night. No blood.  S/p 2 doses cipro. No new side effects. Itching resolved.   Results for orders placed or performed in visit on [id number]  Basic metabolic panel  Result Value Ref Range   Glucose 158 (H) 65 - 99 mg/dL   BUN 22 8 - 27 mg/dL   Creatinine, Ser 1.01 0.76 - 1.27 mg/dL   GFR calc non Af Amer 73 >59 mL/min/1.73   GFR calc Af Amer 84 >59 mL/min/1.73   BUN/Creatinine Ratio 22 10 - 24   Sodium 127 (L) 134 - 144 mmol/L   Potassium 5.6 (H) 3.5 - 5.2 mmol/L   Chloride 97 96 - 106 mmol/L   CO2 15 (LL) 20 - 29 mmol/L   Calcium 10.5 (H) 8.6 - 10.2 mg/dL  PSA  Result Value Ref Range   Prostate Specific Ag, Serum 2.8 0.0 - 4.0  ng/mL  POCT urinalysis dipstick  Result Value Ref Range   Color, UA yellow yellow   Clarity, UA clear clear   Glucose, UA negative negative mg/dL   Bilirubin, UA negative negative   Ketones, POC UA negative negative mg/dL   Spec Grav, UA >=1.030 (A) 1.010 - 1.025   Blood, UA trace-intact (A) negative   pH, UA 5.5 5.0 - 8.0   Protein Ur, POC trace (A) negative mg/dL   Urobilinogen, UA 0.2 0.2 or 1.0 E.U./dL   Nitrite, UA Negative Negative   Leukocytes, UA Negative Negative  POCT Microscopic Urinalysis ([facility])  Result Value Ref Range   WBC,UR,HPF,POC Few (A) None WBC/hpf   RBC,UR,HPF,POC Many (A) None RBC/hpf   Bacteria Moderate (A) None, Too numerous to count   Mucus Absent Absent   Epithelial Cells, UR Per Microscopy Few (A) None, Too numerous to count cells/hpf  POCT glucose (manual entry)  Result Value Ref Range   POC Glucose 163 (A) 70 - 99 mg/dl  POCT CBC  Result Value Ref Range   WBC 11.7 (A) 4.6 - 10.2 K/uL   Lymph, poc 22.7 (A) 0.6 - 3.4   POC LYMPH PERCENT 1.8 (A) 10 - 50 %L   MID (cbc) 75.5 (A) 0 - 0.9   POC  MID % 2.7 0 - 12 %M   POC Granulocyte 0.2 (A) 2 - 6.9   Granulocyte percent 8.8 (A) 37 - 80 %G   RBC 5.02 4.69 - 6.13 M/uL   Hemoglobin 14.9 (A) 11 - 14.6 g/dL   HCT, POC 44.5 (A) 29 - 41 %   MCV 88.7 76 - 111 fL   MCH, POC 29.7 27 - 31.2 pg   MCHC 33.5 31.8 - 35.4 g/dL   RDW, POC 14.1 %   Platelet Count, POC 667 (A) 142 - 424 K/uL   MPV 6.1 0 - 99.8 fL     Patient Active Problem List   Diagnosis Date Noted  . Former smoker 08/24/2016  . Coronary artery calcification 02/04/2016  . Abnormal liver function test 10/15/2015  . Abnormal CXR 05/01/2014  . Diabetes mellitus type II 04/13/2012  . High cholesterol 04/13/2012  . Osteoarthritis of both knees 04/13/2012  . Overweight(278.02) 04/13/2012   Past Medical History:  Diagnosis Date  . Arthritis   . Diabetes mellitus without complication ([place])   . Hyperlipidemia   . Hypertension    Past  Surgical History:  Procedure Laterality Date  . APPENDECTOMY     Allergies  Allergen Reactions  . Sulfa Antibiotics Itching   Prior to Admission medications   Medication Sig Start Date End Date Taking? Authorizing Provider  aspirin 81 MG tablet Take 81 mg by mouth daily.   Yes [provider]  atorvastatin (LIPITOR) 40 MG tablet TAKE 1 TABLET DAILY 02/05/19  Yes Stallings, Zoe A, MD  carvedilol (COREG) 6.25 MG tablet TAKE 1 TABLET TWICE DAILY  WITH MEALS 02/05/19  Yes Stallings, Zoe A, MD  ciprofloxacin (CIPRO) 500 MG tablet Take 1 tablet (500 mg total) by mouth 2 (two) times daily. 03/21/19  Yes Wendie Agreste, MD  lisinopril (ZESTRIL) 20 MG tablet Take 1 tablet (20 mg total) by mouth daily. 03/21/19  Yes Wendie Agreste, MD  metFORMIN (GLUCOPHAGE) 1000 MG tablet TAKE 1 TABLET TWICE DAILY  WITH MEALS 02/05/19  Yes Stallings, Zoe A, MD  terbinafine (LAMISIL) 250 MG tablet Take 1 tablet (250 mg total) by mouth daily. 03/05/19  Yes Forrest Moron, MD  vitamin B-12 (CYANOCOBALAMIN) 1000 MCG tablet Take 1 tablet (1,000 mcg total) by mouth daily. 03/07/19  Yes Forrest Moron, MD   Social History   Socioeconomic History  . Marital status: Divorced    Spouse name: Not on file  . Number of children: 2  . Years of education: Not on file  . Highest education level: High school graduate  Occupational History  . Not on file  Social Needs  . Financial resource strain: Not hard at all  . Food insecurity    Worry: Never true    Inability: Never true  . Transportation needs    Medical: No    Non-medical: No  Tobacco Use  . Smoking status: Former Smoker    Packs/day: 0.25    Years: 50.00    Pack years: 12.50    Types: Cigarettes    Quit date: 03/18/2016    Years since quitting: 3.0  . Smokeless tobacco: Never Used  Substance and Sexual Activity  . Alcohol use: Yes    Alcohol/week: 0.0 standard drinks    Comment: 4 beers once a week   . Drug use: No  . Sexual activity: Not  on file  Lifestyle  . Physical activity    Days per week: 0 days  Minutes per session: 0 min  . Stress: Not at all  Relationships  . Social connections    Talks on phone: More than three times a week    Gets together: More than three times a week    Attends religious service: Never    Active member of club or organization: No    Attends meetings of clubs or organizations: Never    Relationship status: Divorced  . Intimate partner violence    Fear of current or ex partner: No    Emotionally abused: No    Physically abused: No    Forced sexual activity: No  Other Topics Concern  . Not on file  Social History Narrative  . Not on file    Review of Systems Per HPI.     Objective:   Physical Exam Vitals signs reviewed.  Constitutional:      Appearance: He is well-developed.  HENT:     Head: Normocephalic and atraumatic.  Eyes:     Pupils: Pupils are equal, round, and reactive to light.  Neck:     Vascular: No carotid bruit or JVD.  Cardiovascular:     Rate and Rhythm: Normal rate and regular rhythm.     Heart sounds: Normal heart sounds. No murmur.  Pulmonary:     Effort: Pulmonary effort is normal.     Breath sounds: Normal breath sounds. No rales.  Abdominal:     General: Abdomen is flat. Bowel sounds are increased.     Palpations: Abdomen is soft.     Tenderness: There is no abdominal tenderness.  Skin:    General: Skin is warm and dry.  Neurological:     Mental Status: He is alert and oriented to person, place, and time.    Vitals:   03/22/19 0809  BP: 112/65  Pulse: 89  Resp: 12  Temp: 97.6 F (36.4 C)  TempSrc: Oral  SpO2: 96%  Weight: 213 lb 9.6 oz (96.9 kg)     Results for orders placed or performed in visit on 03/22/19  POCT glucose (manual entry)  Result Value Ref Range   POC Glucose 138 (A) 70 - 99 mg/dl  POCT CBC  Result Value Ref Range   WBC 9.7 4.6 - 10.2 K/uL   Lymph, poc 3.2 0.6 - 3.4   POC LYMPH PERCENT 33.0 10 - 50 %L   MID  (cbc) 0.7 0 - 0.9   POC MID % 6.8 0 - 12 %M   POC Granulocyte 5.8 2 - 6.9   Granulocyte percent 60.2 37 - 80 %G   RBC 4.63 (A) 4.69 - 6.13 M/uL   Hemoglobin 14.3 11 - 14.6 g/dL   HCT, POC 40.9 29 - 41 %   MCV 88.3 76 - 111 fL   MCH, POC 30.8 27 - 31.2 pg   MCHC 34.9 31.8 - 35.4 g/dL   RDW, POC 13.6 %   Platelet Count, POC 546 (A) 142 - 424 K/uL   MPV 6.0 0 - 99.8 fL   Orthostatic VS for the past 24 hrs (Last 3 readings):  BP- Lying Pulse- Lying BP- Standing at 0 minutes Pulse- Standing at 0 minutes BP- Standing at 3 minutes Pulse- Standing at 3 minutes  03/22/19 0831 117/71 78 105/63 61 112/68 92       Assessment & Plan:   Anel Angstadt is a 73 y.o. male Prostatitis, unspecified prostatitis type - Plan: POCT CBC  -Improving, denies symptoms of retention.  Tolerating new antibiotic.  PSA has declined as well as leukocytosis.  Continue Cipro.  urology follow-up outpatient.  DM type 2 with diabetic mixed hyperlipidemia ([place]) - Plan: POCT glucose (manual entry)  -Slight elevation, not concerning with current illness.  Hypotension, unspecified hypotension type - Plan: Orthostatic vital signs  -Improved overnight with cessation of antihypertensives and increase fluids.  Initial plan of close monitoring out of office then restart beta-blocker if elevated readings.  Change plans based on labs below.  Hyponatremia - Plan: Basic metabolic panel Low bicarbonate - Plan: Basic metabolic panel Hyperkalemia - Plan: Basic metabolic panel  -Overnight labs received with bicarb at 15, sodium 127, potassium 5.6.  Stat labs obtained.  Unfortunately sodium has had further decline.  This appears to be an acute hyponatremia given normal sodium on August 21.  Fatigue is actually improved today.  However with his further decline recommend emergency room evaluation for repeat testing and possible admission.   -Advised pt of plan and need forfr ER eval he agrees to be seen and will go to ER.   No orders of  the defined types were placed in this encounter.  Patient Instructions   Continue Cipro.  I will watch for results of stool test. Be seen if diarrhea worsens.  Continue to increase fluids.  Keep a record of your blood pressures outside of the office at least twice per day, and if they run over 140/90 - restart carvedilol only. Depending on control next week can decide on lisinopril.  Recheck in 5 days.  Return to the clinic or go to the nearest emergency room if any of your symptoms worsen or new symptoms occur.    How to Take Your Blood Pressure Blood pressure is a measurement of how strongly your blood is pressing against the walls of your arteries. Arteries are blood vessels that carry blood from your heart throughout your body. Your health care provider takes your blood pressure at each office visit. You can also take your own blood pressure at home with a blood pressure machine. You may need to take your own blood pressure:  To confirm a diagnosis of high blood pressure (hypertension).  To monitor your blood pressure over time.  To make sure your blood pressure medicine is working. Supplies needed: To take your blood pressure, you will need a blood pressure machine. You can buy a blood pressure machine, or blood pressure monitor, at most drugstores or online. There are several types of home blood pressure monitors. When choosing one, consider the following:  Choose a monitor that has an arm cuff.  Choose a cuff that wraps snugly around your upper arm. You should be able to fit only one finger between your arm and the cuff.  Do not choose a monitor that measures your blood pressure from your wrist or finger. Your health care provider can suggest a reliable monitor that will meet your needs. How to prepare To get the most accurate reading, avoid the following for 30 minutes before you check your blood pressure:  Drinking caffeine.  Drinking alcohol.  Eating.  Smoking.   Exercising. Five minutes before you check your blood pressure:  Empty your bladder.  Sit quietly without talking in a dining chair, rather than in a soft couch or armchair. How to take your blood pressure To check your blood pressure, follow the instructions in the manual that came with your blood pressure monitor. If you have a digital blood pressure monitor, the instructions may be as follows: 1. Sit up  straight. 2. Place your feet on the floor. Do not cross your ankles or legs. 3. Rest your left arm at the level of your heart on a table or desk or on the arm of a chair. 4. Pull up your shirt sleeve. 5. Wrap the blood pressure cuff around the upper part of your left arm, 1 inch (2.5 cm) above your elbow. It is best to wrap the cuff around bare skin. 6. Fit the cuff snugly around your arm. You should be able to place only one finger between the cuff and your arm. 7. Position the cord inside the groove of your elbow. 8. Press the power button. 9. Sit quietly while the cuff inflates and deflates. 10. Read the digital reading on the monitor screen and write it down (record it). 11. Wait 2-3 minutes, then repeat the steps, starting at step 1. What does my blood pressure reading mean? A blood pressure reading consists of a higher number over a lower number. Ideally, your blood pressure should be below 120/80. The first ("top") number is called the systolic pressure. It is a measure of the pressure in your arteries as your heart beats. The second ("bottom") number is called the diastolic pressure. It is a measure of the pressure in your arteries as the heart relaxes. Blood pressure is classified into four stages. The following are the stages for adults who do not have a short-term serious illness or a chronic condition. Systolic pressure and diastolic pressure are measured in a unit called mm Hg. Normal  Systolic pressure: below 123456.  Diastolic pressure: below 80. Elevated  Systolic pressure:  [id number].  Diastolic pressure: below 80. Hypertension stage 1  Systolic pressure: 0000000.  Diastolic pressure: [id number]. Hypertension stage 2  Systolic pressure: XX123456 or above.  Diastolic pressure: 90 or above. You can have prehypertension or hypertension even if only the systolic or only the diastolic number in your reading is higher than normal. Follow these instructions at home:  Check your blood pressure as often as recommended by your health care provider.  Take your monitor to the next appointment with your health care provider to make sure: ? That you are using it correctly. ? That it provides accurate readings.  Be sure you understand what your goal blood pressure numbers are.  Tell your health care provider if you are having any side effects from blood pressure medicine. Contact a health care provider if:  Your blood pressure is consistently high. Get help right away if:  Your systolic blood pressure is higher than 180.  Your diastolic blood pressure is higher than 110. This information is not intended to replace advice given to you by your health care provider. Make sure you discuss any questions you have with your health care provider. Document Released: 12/04/2015 Document Revised: 06/09/2017 Document Reviewed: 12/04/2015 Elsevier Patient Education  [employer].   If you have lab work done today you will be contacted with your lab results within the next 2 weeks.  If you have not heard from [place] then please contact us. The fastest way to get your results is to register for My Chart.   IF you received an x-ray today, you will receive an invoice from [facility]. Please contact [facility] at [phone] with questions or concerns regarding your invoice.   IF you received labwork today, you will receive an invoice from [facility]. Please contact [facility] at [phone] with questions or concerns regarding your invoice.   Our billing staff will  not be able to assist you with questions regarding bills from these companies.  You will be contacted with the lab results as soon as they are available. The fastest way to get your results is to activate your My Chart account. Instructions are located on the last page of this paperwork. If you have not heard from [place] regarding the results in 2 weeks, please contact this office.       Signed,   Merri Ray, MD [facility].  03/22/19 1:18 PM

## 2019-03-23 ENCOUNTER — Encounter: Payer: Self-pay | Admitting: Family Medicine

## 2019-03-28 ENCOUNTER — Encounter: Payer: Self-pay | Admitting: Family Medicine

## 2019-03-28 ENCOUNTER — Ambulatory Visit (INDEPENDENT_AMBULATORY_CARE_PROVIDER_SITE_OTHER): Payer: Medicare HMO | Admitting: Family Medicine

## 2019-03-28 ENCOUNTER — Other Ambulatory Visit: Payer: Self-pay

## 2019-03-28 VITALS — BP 102/62 | HR 72 | Temp 98.3°F | Resp 14 | Wt 219.4 lb

## 2019-03-28 DIAGNOSIS — A0472 Enterocolitis due to Clostridium difficile, not specified as recurrent: Secondary | ICD-10-CM | POA: Diagnosis not present

## 2019-03-28 DIAGNOSIS — E871 Hypo-osmolality and hyponatremia: Secondary | ICD-10-CM | POA: Diagnosis not present

## 2019-03-28 DIAGNOSIS — I959 Hypotension, unspecified: Secondary | ICD-10-CM | POA: Diagnosis not present

## 2019-03-28 DIAGNOSIS — N419 Inflammatory disease of prostate, unspecified: Secondary | ICD-10-CM

## 2019-03-28 DIAGNOSIS — E875 Hyperkalemia: Secondary | ICD-10-CM | POA: Diagnosis not present

## 2019-03-28 MED ORDER — LISINOPRIL 20 MG PO TABS: 20.0000 mg | ORAL_TABLET | Freq: Every day | ORAL | 0 refills | Status: DC

## 2019-03-28 NOTE — Progress Notes (Signed)
Subjective:    Patient ID: Christopher Bauer, male    DOB: 1946-01-19, 73 y.o.   MRN: [id number]  HPI Christopher Bauer is a 73 y.o. male Presents today for: Chief Complaint  Patient presents with  . Prostatitis    5 Day f/u on postatitis and Blood pressure. Patient states he has been on 6 days of abx now. Been feeling much better. Went to the ER last friday 03/22/19 they gave saline nasal spray and but me on 4 day abx. Last bp reading was 120/70 yday. If we going to continue the linsiopril 20 mg Will need a 90 day supply and discon the lisinopril/hydrochlorot 20-12.5 mg   Prostatitis: See previous visits.  Initially treated with Septra, possible itching, change to Cipro on September 10 but was improving at that time.  No retention.  PSA had improved -9.0 on 8/28, 2.8 on September 10.  Plan for outpatient urology follow-up given suspected enlarged prostate on initial exam.  Status post 2 weeks and 5 days of antibiotics. Urinating normally.   Diarrhea: Discussed September 10.  C. difficile testing obtained.  Positive result noted when he was evaluated in the ER on September 11.  Started on vancomycin 125 mg oral 4 times per day. Diarrhea almost resolved - more formed stools. Only few per day.  Feeling better. Less fatigue. drinking fluids and solid food.  No n/v.  No fever, no abd pain.   Hypotension See previous visits.  Temporarily held antihypertensives at September 10 visit.  Option of slow restart of medications with beta-blocker, then ACE inhibitor as blood pressure is increased at home. Back on all meds now past few days including Coreg and lisinopril (HCTZ held initial visit).  Home BP machine - 140/78.    Hyponatremia: See prior visits.  Low of 124, sent to ER last week for possible admission.  In the ER his sodium was 128.  He was given IV fluids.  Discharged home with continued home oral rehydration therapy.  Did have previous diarrhea with C. difficile as above.  Hyperkalemia Max  level of 5.6 on September 10.  Improved to 5.2 in the ER on September 11.  IV fluids given at that time.   Patient Active Problem List   Diagnosis Date Noted  . Former smoker 08/24/2016  . Coronary artery calcification 02/04/2016  . Abnormal liver function test 10/15/2015  . Abnormal CXR 05/01/2014  . Diabetes mellitus type II 04/13/2012  . High cholesterol 04/13/2012  . Osteoarthritis of both knees 04/13/2012  . Overweight(278.02) 04/13/2012   Past Medical History:  Diagnosis Date  . Arthritis   . Diabetes mellitus without complication ([place])   . Hyperlipidemia   . Hypertension   . Prostatitis    Past Surgical History:  Procedure Laterality Date  . APPENDECTOMY     Allergies  Allergen Reactions  . Sulfa Antibiotics Itching   Prior to Admission medications   Medication Sig Start Date End Date Taking? Authorizing Provider  aspirin 81 MG tablet Take 81 mg by mouth daily.   Yes [provider]  atorvastatin (LIPITOR) 40 MG tablet TAKE 1 TABLET DAILY 02/05/19  Yes Stallings, Zoe A, MD  carvedilol (COREG) 6.25 MG tablet TAKE 1 TABLET TWICE DAILY  WITH MEALS 02/05/19  Yes Stallings, Zoe A, MD  ciprofloxacin (CIPRO) 500 MG tablet Take 1 tablet (500 mg total) by mouth 2 (two) times daily. 03/21/19  Yes Wendie Agreste, MD  lisinopril (ZESTRIL) 20 MG tablet Take 1 tablet (20 mg  total) by mouth daily. 03/21/19  Yes Wendie Agreste, MD  metFORMIN (GLUCOPHAGE) 1000 MG tablet TAKE 1 TABLET TWICE DAILY  WITH MEALS 02/05/19  Yes Stallings, Zoe A, MD  terbinafine (LAMISIL) 250 MG tablet Take 1 tablet (250 mg total) by mouth daily. 03/05/19  Yes Forrest Moron, MD  vancomycin (VANCOCIN) 125 MG capsule Take 1 capsule (125 mg total) by mouth 4 (four) times daily. 03/22/19  Yes Malvin Johns, MD  vitamin B-12 (CYANOCOBALAMIN) 1000 MCG tablet Take 1 tablet (1,000 mcg total) by mouth daily. 03/07/19  Yes Forrest Moron, MD   Social History   Socioeconomic History  . Marital status:  Divorced    Spouse name: Not on file  . Number of children: 2  . Years of education: Not on file  . Highest education level: High school graduate  Occupational History  . Not on file  Social Needs  . Financial resource strain: Not hard at all  . Food insecurity    Worry: Never true    Inability: Never true  . Transportation needs    Medical: No    Non-medical: No  Tobacco Use  . Smoking status: Former Smoker    Packs/day: 0.25    Years: 50.00    Pack years: 12.50    Types: Cigarettes    Quit date: 03/18/2016    Years since quitting: 3.0  . Smokeless tobacco: Never Used  Substance and Sexual Activity  . Alcohol use: Yes    Alcohol/week: 0.0 standard drinks    Comment: 4 beers once a week   . Drug use: No  . Sexual activity: Not on file  Lifestyle  . Physical activity    Days per week: 0 days    Minutes per session: 0 min  . Stress: Not at all  Relationships  . Social connections    Talks on phone: More than three times a week    Gets together: More than three times a week    Attends religious service: Never    Active member of club or organization: No    Attends meetings of clubs or organizations: Never    Relationship status: Divorced  . Intimate partner violence    Fear of current or ex partner: No    Emotionally abused: No    Physically abused: No    Forced sexual activity: No  Other Topics Concern  . Not on file  Social History Narrative  . Not on file    Review of Systems Per HPi     Objective:   Physical Exam Vitals signs reviewed.  Constitutional:      General: He is not in acute distress.    Appearance: Normal appearance. He is well-developed. He is not ill-appearing or toxic-appearing.  HENT:     Head: Normocephalic and atraumatic.  Eyes:     Pupils: Pupils are equal, round, and reactive to light.  Neck:     Vascular: No carotid bruit or JVD.  Cardiovascular:     Rate and Rhythm: Normal rate and regular rhythm.     Heart sounds: Normal  heart sounds. No murmur.  Pulmonary:     Effort: Pulmonary effort is normal.     Breath sounds: Normal breath sounds. No rales.  Abdominal:     General: Abdomen is flat. Bowel sounds are normal.     Tenderness: There is no abdominal tenderness. There is no right CVA tenderness or left CVA tenderness.  Skin:    General: Skin  is warm and dry.  Neurological:     Mental Status: He is alert and oriented to person, place, and time.  Psychiatric:        Mood and Affect: Mood normal.        Behavior: Behavior normal.    Vitals:   03/28/19 0839  BP: 102/62  Pulse: 72  Resp: 14  Temp: 98.3 F (36.8 C)  TempSrc: Oral  SpO2: 96%  Weight: 219 lb 6.4 oz (99.5 kg)        Assessment & Plan:   Christopher Bauer is a 73 y.o. male Hyponatremia - Plan: Basic metabolic panel Hyperkalemia - Plan: Basic metabolic panel  -Symptomatically improved.  Repeat labs.  Hypotension, unspecified hypotension type - Plan: lisinopril (ZESTRIL) 20 MG tablet  -Still borderline low, monitor home readings.  Hold lisinopril if systolic less than [id number].  Maintain hydration, recheck 2 weeks  Prostatitis, unspecified prostatitis type  -Resolution of symptoms,-Cipro 3 weeks total course given C. difficile colitis.  If symptoms return, restart antibiotics and follow-up in office.  Urology eval pending.  Enteritis due to Clostridium difficile - Plan: Basic metabolic panel  -Improved with oral vancomycin.  Continue full course.  RTC precautions  Meds ordered this encounter  Medications  . lisinopril (ZESTRIL) 20 MG tablet    Sig: Take 1 tablet (20 mg total) by mouth daily.    Dispense:  90 tablet    Refill:  0   Patient Instructions   Continue vancomycin for C diff diarrhea infection After tomorrow, can stop Cipro as long as no new urinary symptoms. If those return, restart antibiotics. I will check sodium and potassium levels again today. Make sure to continue drinking plenty of fluids.   If blood pressure below  110 - hold lisinopril. Otherwise continue same meds for now and recheck in 2 weeks.    If you have lab work done today you will be contacted with your lab results within the next 2 weeks.  If you have not heard from [place] then please contact us. The fastest way to get your results is to register for My Chart.   IF you received an x-ray today, you will receive an invoice from [facility]. Please contact [facility] at [phone] with questions or concerns regarding your invoice.   IF you received labwork today, you will receive an invoice from [facility]. Please contact [facility] at [phone] with questions or concerns regarding your invoice.   Our billing staff will not be able to assist you with questions regarding bills from these companies.  You will be contacted with the lab results as soon as they are available. The fastest way to get your results is to activate your My Chart account. Instructions are located on the last page of this paperwork. If you have not heard from [place] regarding the results in 2 weeks, please contact this office.       Signed,   Merri Ray, MD [facility] [place].  03/29/19 10:26 AM

## 2019-03-28 NOTE — Patient Instructions (Addendum)
Continue vancomycin for C diff diarrhea infection After tomorrow, can stop Cipro as long as no new urinary symptoms. If those return, restart antibiotics. I will check sodium and potassium levels again today. Make sure to continue drinking plenty of fluids.   If blood pressure below 110 - hold lisinopril. Otherwise continue same meds for now and recheck in 2 weeks.    If you have lab work done today you will be contacted with your lab results within the next 2 weeks.  If you have not heard from [place] then please contact us. The fastest way to get your results is to register for My Chart.   IF you received an x-ray today, you will receive an invoice from [facility]. Please contact [facility] at [phone] with questions or concerns regarding your invoice.   IF you received labwork today, you will receive an invoice from [place]. Please contact [facility] at [phone] with questions or concerns regarding your invoice.   Our billing staff will not be able to assist you with questions regarding bills from these companies.  You will be contacted with the lab results as soon as they are available. The fastest way to get your results is to activate your My Chart account. Instructions are located on the last page of this paperwork. If you have not heard from [place] regarding the results in 2 weeks, please contact this office.

## 2019-03-29 ENCOUNTER — Encounter: Payer: Self-pay | Admitting: Family Medicine

## 2019-03-29 LAB — BASIC METABOLIC PANEL
BUN/Creatinine Ratio: 11 (ref 10–24)
BUN: 8 mg/dL (ref 8–27)
CO2: 20 mmol/L (ref 20–29)
Calcium: 9.5 mg/dL (ref 8.6–10.2)
Chloride: 104 mmol/L (ref 96–106)
Creatinine, Ser: 0.75 mg/dL — ABNORMAL LOW (ref 0.76–1.27)
GFR calc Af Amer: 104 mL/min/{1.73_m2} (ref >59)
GFR calc non Af Amer: 90 mL/min/{1.73_m2} (ref >59)
Glucose: 132 mg/dL — ABNORMAL HIGH (ref 65–99)
Potassium: 4.6 mmol/L (ref 3.5–5.2)
Sodium: 137 mmol/L (ref 134–144)

## 2019-03-30 ENCOUNTER — Other Ambulatory Visit: Payer: Self-pay | Admitting: Family Medicine

## 2019-03-31 NOTE — Telephone Encounter (Signed)
Requested medication (s) are due for refill today  NO  Requested medication (s) are on the active medication list NO  Future visit scheduled   YES     Requested Prescriptions  Pending Prescriptions Disp Refills   meloxicam (MOBIC) 15 MG tablet [Pharmacy Med Name: MELOXICAM TAB 15MG ] 90 tablet 0    Sig: TAKE 1 TABLET DAILY AS     NEEDED FOR PAIN     Analgesics:  COX2 Inhibitors Failed - 03/30/2019  8:26 AM      Failed - Cr in normal range and within 360 days    Creat  Date Value Ref Range Status  05/25/2016 0.66 (L) 0.70 - 1.18 mg/dL Final    Comment:      For patients > or = 74 years of age: The upper reference limit for Creatinine is approximately 13% higher for people identified as African-American.      Creatinine, Ser  Date Value Ref Range Status  03/28/2019 0.75 (L) 0.76 - 1.27 mg/dL Final         Passed - HGB in normal range and within 360 days    Hemoglobin  Date Value Ref Range Status  03/22/2019 14.7 13.0 - 17.0 g/dL Final  09/07/2018 15.0 13.0 - 17.7 g/dL Final         Passed - Patient is not pregnant      Passed - Valid encounter within last 12 months    Recent Outpatient Visits          3 days ago Hyponatremia   [facility], Ranell Patrick, MD   1 week ago Prostatitis, unspecified prostatitis type   [facility], Ranell Patrick, MD   1 week ago Prostatitis, unspecified prostatitis type   [facility], Ranell Patrick, MD   2 weeks ago Medicare annual wellness visit, subsequent   [facility], Lilia Argue, MD   3 weeks ago Urinary tract infection symptoms   [facility], Ranell Patrick, MD      Future Appointments            In 1 week Carlota Raspberry Ranell Patrick, MD [facility] [place], [facility]   In 5 months Forrest Moron, MD [facility] [place], [facility]

## 2019-04-05 DIAGNOSIS — G5602 Carpal tunnel syndrome, left upper limb: Secondary | ICD-10-CM | POA: Diagnosis not present

## 2019-04-05 DIAGNOSIS — M1180 Other specified crystal arthropathies, unspecified site: Secondary | ICD-10-CM | POA: Diagnosis not present

## 2019-04-05 DIAGNOSIS — M65331 Trigger finger, right middle finger: Secondary | ICD-10-CM | POA: Diagnosis not present

## 2019-04-06 ENCOUNTER — Encounter: Payer: Self-pay | Admitting: Family Medicine

## 2019-04-12 ENCOUNTER — Encounter: Payer: Self-pay | Admitting: Family Medicine

## 2019-04-12 ENCOUNTER — Ambulatory Visit (INDEPENDENT_AMBULATORY_CARE_PROVIDER_SITE_OTHER): Payer: Medicare HMO | Admitting: Family Medicine

## 2019-04-12 ENCOUNTER — Other Ambulatory Visit: Payer: Self-pay

## 2019-04-12 VITALS — BP 110/61 | HR 81 | Temp 98.6°F | Resp 16 | Ht 71.85 in | Wt 221.0 lb

## 2019-04-12 DIAGNOSIS — E871 Hypo-osmolality and hyponatremia: Secondary | ICD-10-CM | POA: Diagnosis not present

## 2019-04-12 DIAGNOSIS — N419 Inflammatory disease of prostate, unspecified: Secondary | ICD-10-CM

## 2019-04-12 DIAGNOSIS — B351 Tinea unguium: Secondary | ICD-10-CM

## 2019-04-12 DIAGNOSIS — Z5181 Encounter for therapeutic drug level monitoring: Secondary | ICD-10-CM | POA: Diagnosis not present

## 2019-04-12 DIAGNOSIS — E875 Hyperkalemia: Secondary | ICD-10-CM | POA: Diagnosis not present

## 2019-04-12 LAB — POCT URINALYSIS DIP (MANUAL ENTRY)
Bilirubin, UA: NEGATIVE
Blood, UA: NEGATIVE
Glucose, UA: NEGATIVE mg/dL
Ketones, POC UA: NEGATIVE mg/dL
Leukocytes, UA: NEGATIVE
Nitrite, UA: NEGATIVE
Protein Ur, POC: NEGATIVE mg/dL
Spec Grav, UA: >=1.03 — AB (ref 1.010–1.025)
Urobilinogen, UA: 1 E.U./dL
pH, UA: 5.5 (ref 5.0–8.0)

## 2019-04-12 NOTE — Progress Notes (Signed)
Subjective:    Patient ID: Christopher Bauer, male    DOB: 15-Apr-1946, 73 y.o.   MRN: [id number]  HPI Christopher Bauer is a 73 y.o. male Presents today for: Chief Complaint  Patient presents with  . Chronic Conditions    2 week folllow-up for medical conditions    Here for follow-up.  Prostatitis: Previously treated with Septra, then Cipro for prostatitis early September.  Symptoms have resolved at September 17 visit.  Finish Cipro September 18. No difficulty urinating. Slight dysuria at onset only. No change in frequency/urgency. UOP about every 1.5 - 2 hrs.   C. difficile colitis: Started on treatment September 11 by emergency room, vancomycin 125 mg oral 4 times per day.  Improving at September 17 visit. Improved overall - stools better past few days off colchicine: Met with ortho few weeks ago at Dr. Hartley Barefoot - stopped meloxicam. Changed to colchicine, but then that was stopped d/t diarrhea. Now back on meloxicam - only as needed now. Prior dailly for 7-8 years.   Hypotension: See prior visits.  Ultimately was able to return to his normal regimen at September 17 visit.  Home blood pressure 140/78, but in office testing 102/62.  Advised to hold lisinopril if systolic less than 735.  Continued hydration recommended. BP Readings from Last 3 Encounters:  04/12/19 110/61  03/28/19 102/62  03/22/19 (!) 152/75  home readings:  Machine here - close to our readings.    Hyponatremia: Resolved at last visit, possible prior volume depletion as cause. Lab Results  Component Value Date   NA 137 03/28/2019   K 4.6 03/28/2019   CL 104 03/28/2019   CO2 20 03/28/2019   Onychomycosis:  Has been treated with terbinafine '250mg'$  qd since 03/01/19.  No appreciable change in appearance yet.  Lab Results  Component Value Date   ALT 35 03/01/2019   AST 32 03/01/2019   ALKPHOS 113 03/01/2019   BILITOT 0.9 03/01/2019   Diabetes:  Lab Results  Component Value Date   HGBA1C 7.1 (A) 03/01/2019    HGBA1C 7.3 (A) 09/07/2018   HGBA1C 7.1 (H) 03/03/2018   Lab Results  Component Value Date   MICROALBUR 0.3 10/15/2015   LDLCALC 70 03/01/2019   CREATININE 0.75 (L) 03/28/2019  metformin '1000mg'$  BID.   Patient Active Problem List   Diagnosis Date Noted  . Former smoker 08/24/2016  . Coronary artery calcification 02/04/2016  . Abnormal liver function test 10/15/2015  . Abnormal CXR 05/01/2014  . Diabetes mellitus type II 04/13/2012  . High cholesterol 04/13/2012  . Osteoarthritis of both knees 04/13/2012   Past Medical History:  Diagnosis Date  . Arthritis   . Diabetes mellitus without complication ([place])   . Hyperlipidemia   . Hypertension   . Prostatitis    Past Surgical History:  Procedure Laterality Date  . APPENDECTOMY     Allergies  Allergen Reactions  . Sulfa Antibiotics Itching   Prior to Admission medications   Medication Sig Start Date End Date Taking? Authorizing Provider  aspirin 81 MG tablet Take 81 mg by mouth daily.   Yes [provider]  atorvastatin (LIPITOR) 40 MG tablet TAKE 1 TABLET DAILY 02/05/19  Yes Stallings, Zoe A, MD  carvedilol (COREG) 6.25 MG tablet TAKE 1 TABLET TWICE DAILY  WITH MEALS 02/05/19  Yes Stallings, Zoe A, MD  lisinopril (ZESTRIL) 20 MG tablet Take 1 tablet (20 mg total) by mouth daily. 03/28/19  Yes Wendie Agreste, MD  meloxicam (MOBIC) 15 MG  tablet TAKE 1 TABLET DAILY AS     NEEDED FOR PAIN 04/01/19  Yes Stallings, Zoe A, MD  metFORMIN (GLUCOPHAGE) 1000 MG tablet TAKE 1 TABLET TWICE DAILY  WITH MEALS 02/05/19  Yes Stallings, Zoe A, MD  terbinafine (LAMISIL) 250 MG tablet Take 1 tablet (250 mg total) by mouth daily. 03/05/19  Yes Forrest Moron, MD  vitamin B-12 (CYANOCOBALAMIN) 1000 MCG tablet Take 1 tablet (1,000 mcg total) by mouth daily. 03/07/19  Yes Forrest Moron, MD   Social History   Socioeconomic History  . Marital status: Divorced    Spouse name: Not on file  . Number of children: 2  . Years of education:  Not on file  . Highest education level: High school graduate  Occupational History  . Not on file  Social Needs  . Financial resource strain: Not hard at all  . Food insecurity    Worry: Never true    Inability: Never true  . Transportation needs    Medical: No    Non-medical: No  Tobacco Use  . Smoking status: Former Smoker    Packs/day: 0.25    Years: 50.00    Pack years: 12.50    Types: Cigarettes    Quit date: 03/18/2016    Years since quitting: 3.0  . Smokeless tobacco: Never Used  Substance and Sexual Activity  . Alcohol use: Yes    Alcohol/week: 0.0 standard drinks    Comment: 4 beers once a week   . Drug use: No  . Sexual activity: Not on file  Lifestyle  . Physical activity    Days per week: 0 days    Minutes per session: 0 min  . Stress: Not at all  Relationships  . Social connections    Talks on phone: More than three times a week    Gets together: More than three times a week    Attends religious service: Never    Active member of club or organization: No    Attends meetings of clubs or organizations: Never    Relationship status: Divorced  . Intimate partner violence    Fear of current or ex partner: No    Emotionally abused: No    Physically abused: No    Forced sexual activity: No  Other Topics Concern  . Not on file  Social History Narrative  . Not on file    Review of Systems Per HPI.     Objective:   Physical Exam Vitals signs reviewed.  Constitutional:      Appearance: He is well-developed.  HENT:     Head: Normocephalic and atraumatic.  Eyes:     Pupils: Pupils are equal, round, and reactive to light.  Neck:     Vascular: No carotid bruit or JVD.  Cardiovascular:     Rate and Rhythm: Normal rate and regular rhythm.     Heart sounds: Normal heart sounds. No murmur.  Pulmonary:     Effort: Pulmonary effort is normal.     Breath sounds: Normal breath sounds. No rales.  Abdominal:     General: Abdomen is flat.     Tenderness:  There is no abdominal tenderness.  Skin:    General: Skin is warm and dry.  Neurological:     Mental Status: He is alert and oriented to person, place, and time.    Vitals:   04/12/19 1035  BP: 110/61  Pulse: 81  Resp: 16  Temp: 98.6 F (37 C)  TempSrc:  Oral  SpO2: 99%  Weight: 221 lb (100.2 kg)  Height: 5' 11.85" (1.825 m)   Results for orders placed or performed in visit on 04/12/19  Comprehensive metabolic panel  Result Value Ref Range   Glucose 118 (H) 65 - 99 mg/dL   BUN 13 8 - 27 mg/dL   Creatinine, Ser 0.78 0.76 - 1.27 mg/dL   GFR calc non Af Amer 89 >59 mL/min/1.73   GFR calc Af Amer 103 >59 mL/min/1.73   BUN/Creatinine Ratio 17 10 - 24   Sodium 142 134 - 144 mmol/L   Potassium 4.2 3.5 - 5.2 mmol/L   Chloride 106 96 - 106 mmol/L   CO2 19 (L) 20 - 29 mmol/L   Calcium 10.1 8.6 - 10.2 mg/dL   Total Protein 6.1 6.0 - 8.5 g/dL   Albumin 4.2 3.7 - 4.7 g/dL   Globulin, Total 1.9 1.5 - 4.5 g/dL   Albumin/Globulin Ratio 2.2 1.2 - 2.2   Bilirubin Total 0.6 0.0 - 1.2 mg/dL   Alkaline Phosphatase 105 39 - 117 IU/L   AST 28 0 - 40 IU/L   ALT 34 0 - 44 IU/L  PSA  Result Value Ref Range   Prostate Specific Ag, Serum 1.3 0.0 - 4.0 ng/mL  POCT urinalysis dipstick  Result Value Ref Range   Color, UA yellow yellow   Clarity, UA clear clear   Glucose, UA negative negative mg/dL   Bilirubin, UA negative negative   Ketones, POC UA negative negative mg/dL   Spec Grav, UA >=1.030 (A) 1.010 - 1.025   Blood, UA negative negative   pH, UA 5.5 5.0 - 8.0   Protein Ur, POC negative negative mg/dL   Urobilinogen, UA 1.0 0.2 or 1.0 E.U./dL   Nitrite, UA Negative Negative   Leukocytes, UA Negative Negative        Assessment & Plan:  Christopher Bauer is a 73 y.o. male Onychomycosis - Plan: Comprehensive metabolic panel Medication monitoring encounter - Plan: Comprehensive metabolic panel  -Continue Lamisil, check hepatic function panel.  Hyponatremia  -Resolved.  Tolerating  fluid intake at home now.  Diarrhea overall improved.  Unlikely C. difficile but cautioned on symptoms that would indicate recurrence.  Metformin, colchicine, and also possible contributors but now off colchicine.  Hyperkalemia  -Resolved last visit.  Prostatitis, unspecified prostatitis type - Plan: PSA, POCT urinalysis dipstick  -Symptomatically improved.  Urinalysis without concerns, PSA is okay.  RTC precautions given.   Reviewed blood pressure recommendations and when to hold medication.   No orders of the defined types were placed in this encounter.  Patient Instructions   Keep a record of your blood pressures outside of the office.  If blood pressure below 110 on upper number - stop lisinopril.  I will check liver tests, continue terbinafine. Recheck in 6 weeks for liver tests and to look at nails.  No other med changes for now.   Recheck in about 6 weeks for repeat diabetes and liver tests.   Thanks for coming in today.   If you have lab work done today you will be contacted with your lab results within the next 2 weeks.  If you have not heard from [place] then please contact us. The fastest way to get your results is to register for My Chart.   IF you received an x-ray today, you will receive an invoice from [facility]. Please contact [facility] at [phone] with questions or concerns regarding your invoice.   IF you  received labwork today, you will receive an invoice from [place]. Please contact [facility] at [phone] with questions or concerns regarding your invoice.   Our billing staff will not be able to assist you with questions regarding bills from these companies.  You will be contacted with the lab results as soon as they are available. The fastest way to get your results is to activate your My Chart account. Instructions are located on the last page of this paperwork. If you have not heard from [place] regarding the results in 2 weeks, please contact  this office.       Signed,   Merri Ray, MD [facility] [place].  04/13/19 8:08 AM

## 2019-04-12 NOTE — Patient Instructions (Addendum)
Keep a record of your blood pressures outside of the office.  If blood pressure below 110 on upper number - stop lisinopril.  I will check liver tests, continue terbinafine. Recheck in 6 weeks for liver tests and to look at nails.  No other med changes for now.   Recheck in about 6 weeks for repeat diabetes and liver tests.   Thanks for coming in today.   If you have lab work done today you will be contacted with your lab results within the next 2 weeks.  If you have not heard from [place] then please contact us. The fastest way to get your results is to register for My Chart.   IF you received an x-ray today, you will receive an invoice from [facility]. Please contact [facility] at [phone] with questions or concerns regarding your invoice.   IF you received labwork today, you will receive an invoice from [place]. Please contact [facility] at [phone] with questions or concerns regarding your invoice.   Our billing staff will not be able to assist you with questions regarding bills from these companies.  You will be contacted with the lab results as soon as they are available. The fastest way to get your results is to activate your My Chart account. Instructions are located on the last page of this paperwork. If you have not heard from [place] regarding the results in 2 weeks, please contact this office.

## 2019-04-13 ENCOUNTER — Encounter: Payer: Self-pay | Admitting: Family Medicine

## 2019-04-13 LAB — COMPREHENSIVE METABOLIC PANEL
ALT: 34 IU/L (ref 0–44)
AST: 28 IU/L (ref 0–40)
Albumin/Globulin Ratio: 2.2 (ref 1.2–2.2)
Albumin: 4.2 g/dL (ref 3.7–4.7)
Alkaline Phosphatase: 105 IU/L (ref 39–117)
BUN/Creatinine Ratio: 17 (ref 10–24)
BUN: 13 mg/dL (ref 8–27)
Bilirubin Total: 0.6 mg/dL (ref 0.0–1.2)
CO2: 19 mmol/L — ABNORMAL LOW (ref 20–29)
Calcium: 10.1 mg/dL (ref 8.6–10.2)
Chloride: 106 mmol/L (ref 96–106)
Creatinine, Ser: 0.78 mg/dL (ref 0.76–1.27)
GFR calc Af Amer: 103 mL/min/{1.73_m2} (ref >59)
GFR calc non Af Amer: 89 mL/min/{1.73_m2} (ref >59)
Globulin, Total: 1.9 g/dL (ref 1.5–4.5)
Glucose: 118 mg/dL — ABNORMAL HIGH (ref 65–99)
Potassium: 4.2 mmol/L (ref 3.5–5.2)
Sodium: 142 mmol/L (ref 134–144)
Total Protein: 6.1 g/dL (ref 6.0–8.5)

## 2019-04-13 LAB — PSA: Prostate Specific Ag, Serum: 1.3 ng/mL (ref 0.0–4.0)

## 2019-05-03 DIAGNOSIS — M1180 Other specified crystal arthropathies, unspecified site: Secondary | ICD-10-CM | POA: Diagnosis not present

## 2019-05-03 DIAGNOSIS — G5602 Carpal tunnel syndrome, left upper limb: Secondary | ICD-10-CM | POA: Diagnosis not present

## 2019-05-03 DIAGNOSIS — M65331 Trigger finger, right middle finger: Secondary | ICD-10-CM | POA: Diagnosis not present

## 2019-05-08 DIAGNOSIS — H2513 Age-related nuclear cataract, bilateral: Secondary | ICD-10-CM | POA: Diagnosis not present

## 2019-05-08 DIAGNOSIS — E119 Type 2 diabetes mellitus without complications: Secondary | ICD-10-CM | POA: Diagnosis not present

## 2019-05-08 LAB — HM DIABETES EYE EXAM

## 2019-05-13 DIAGNOSIS — G5602 Carpal tunnel syndrome, left upper limb: Secondary | ICD-10-CM | POA: Diagnosis not present

## 2019-05-15 ENCOUNTER — Other Ambulatory Visit: Payer: Self-pay | Admitting: Family Medicine

## 2019-05-15 ENCOUNTER — Encounter: Payer: Self-pay | Admitting: Family Medicine

## 2019-05-17 DIAGNOSIS — M65331 Trigger finger, right middle finger: Secondary | ICD-10-CM | POA: Diagnosis not present

## 2019-05-17 DIAGNOSIS — G5602 Carpal tunnel syndrome, left upper limb: Secondary | ICD-10-CM | POA: Diagnosis not present

## 2019-05-21 ENCOUNTER — Other Ambulatory Visit: Payer: Self-pay | Admitting: Family Medicine

## 2019-05-21 DIAGNOSIS — I959 Hypotension, unspecified: Secondary | ICD-10-CM

## 2019-05-24 ENCOUNTER — Other Ambulatory Visit: Payer: Self-pay

## 2019-05-24 ENCOUNTER — Encounter: Payer: Self-pay | Admitting: Family Medicine

## 2019-05-24 ENCOUNTER — Ambulatory Visit (INDEPENDENT_AMBULATORY_CARE_PROVIDER_SITE_OTHER): Payer: Medicare HMO | Admitting: Family Medicine

## 2019-05-24 VITALS — BP 128/70 | HR 82 | Temp 98.1°F | Resp 16 | Ht 71.0 in | Wt 227.0 lb

## 2019-05-24 DIAGNOSIS — E782 Mixed hyperlipidemia: Secondary | ICD-10-CM | POA: Diagnosis not present

## 2019-05-24 DIAGNOSIS — I959 Hypotension, unspecified: Secondary | ICD-10-CM

## 2019-05-24 DIAGNOSIS — E1169 Type 2 diabetes mellitus with other specified complication: Secondary | ICD-10-CM | POA: Diagnosis not present

## 2019-05-24 DIAGNOSIS — E871 Hypo-osmolality and hyponatremia: Secondary | ICD-10-CM | POA: Diagnosis not present

## 2019-05-24 DIAGNOSIS — B351 Tinea unguium: Secondary | ICD-10-CM

## 2019-05-24 DIAGNOSIS — I2510 Atherosclerotic heart disease of native coronary artery without angina pectoris: Secondary | ICD-10-CM

## 2019-05-24 DIAGNOSIS — I10 Essential (primary) hypertension: Secondary | ICD-10-CM | POA: Diagnosis not present

## 2019-05-24 DIAGNOSIS — G5602 Carpal tunnel syndrome, left upper limb: Secondary | ICD-10-CM | POA: Diagnosis not present

## 2019-05-24 MED ORDER — CARVEDILOL 6.25 MG PO TABS: 6.2500 mg | ORAL_TABLET | Freq: Two times a day (BID) | ORAL | 1 refills | Status: DC

## 2019-05-24 MED ORDER — LISINOPRIL 20 MG PO TABS: 20.0000 mg | ORAL_TABLET | Freq: Every day | ORAL | 1 refills | Status: DC

## 2019-05-24 MED ORDER — METFORMIN HCL 1000 MG PO TABS: 1000.0000 mg | ORAL_TABLET | Freq: Two times a day (BID) | ORAL | 1 refills | Status: DC

## 2019-05-24 MED ORDER — ATORVASTATIN CALCIUM 40 MG PO TABS: 40.0000 mg | ORAL_TABLET | Freq: Every day | ORAL | 1 refills | Status: DC

## 2019-05-24 NOTE — Patient Instructions (Addendum)
  Weight increasing - try to start low intensity exercise with walking most days per week.  No  change in meds for now until I see labs.   Thanks for coming in today. Recheck in 3 months.    If you have lab work done today you will be contacted with your lab results within the next 2 weeks.  If you have not heard from [place] then please contact us. The fastest way to get your results is to register for My Chart.   IF you received an x-ray today, you will receive an invoice from [facility]. Please contact [facility] at [phone] with questions or concerns regarding your invoice.   IF you received labwork today, you will receive an invoice from [place]. Please contact [facility] at [phone] with questions or concerns regarding your invoice.   Our billing staff will not be able to assist you with questions regarding bills from these companies.  You will be contacted with the lab results as soon as they are available. The fastest way to get your results is to activate your My Chart account. Instructions are located on the last page of this paperwork. If you have not heard from [place] regarding the results in 2 weeks, please contact this office.

## 2019-05-24 NOTE — Progress Notes (Signed)
Subjective:  Patient ID: Christopher Bauer, male    DOB: 10-Jun-1946  Age: 73 y.o. MRN: [id number]  CC:  Chief Complaint  Patient presents with  . Diabetes    3 month follow up  . Hypertension    3 month follow up    HPI Christopher Bauer presents for   Diabetes: Complicated by polyneuropathy, dyslipidemia, coronary artery disease.  Cardiology Dr. Einar Gip.  Takes Metformin 1000 mg twice daily, he is on ACE inhibitor, statin. Not checking home readings.  No new side effects.  Microalbumin: Normal ratio in 2019 Optho, foot exam, pneumovax: Up-to-date  Lab Results  Component Value Date   HGBA1C 7.1 (A) 03/01/2019   HGBA1C 7.3 (A) 09/07/2018   HGBA1C 7.1 (H) 03/03/2018   Lab Results  Component Value Date   MICROALBUR 0.3 10/15/2015   LDLCALC 70 03/01/2019   CREATININE 0.78 04/12/2019   Wt Readings from Last 3 Encounters:  05/24/19 227 lb (103 kg)  04/12/19 221 lb (100.2 kg)  03/28/19 219 lb 6.4 oz (99.5 kg)  no regular exercise.    Hypertension: Takes lisinopril 20 mg daily, carvedilol 6.25 mg twice daily.  History of CAD as above, cardiology Dr. Einar Gip, appointment in January. Home readings 130-140/80.  No chest pain, DOE.  Prior hyponatremia resolved.  BP Readings from Last 3 Encounters:  05/24/19 128/70  04/12/19 110/61  03/28/19 102/62   Lab Results  Component Value Date   CREATININE 0.78 04/12/2019   Hyperlipidemia: lipitor 40mg  qd.  No new side effects/myalgias.  Lab Results  Component Value Date   CHOL 158 03/01/2019   HDL 43 03/01/2019   LDLCALC 70 03/01/2019   TRIG 225 (H) 03/01/2019   CHOLHDL 3.7 03/01/2019   Lab Results  Component Value Date   ALT 34 04/12/2019   AST 28 04/12/2019   ALKPHOS 105 04/12/2019   BILITOT 0.6 04/12/2019   Onychomycosis On lamisil 250mg  QD.  Seems to be improving - noticed improvement at pedicure.   Carpal tunnel release today,  Cataract repair on 11/20.   History Patient Active Problem List   Diagnosis Date Noted  .  Former smoker 08/24/2016  . Coronary artery calcification 02/04/2016  . Abnormal liver function test 10/15/2015  . Abnormal CXR 05/01/2014  . Diabetes mellitus type II 04/13/2012  . High cholesterol 04/13/2012  . Osteoarthritis of both knees 04/13/2012   Past Medical History:  Diagnosis Date  . Arthritis   . Diabetes mellitus without complication ([place])   . Hyperlipidemia   . Hypertension   . Prostatitis    Past Surgical History:  Procedure Laterality Date  . APPENDECTOMY     Allergies  Allergen Reactions  . Sulfa Antibiotics Itching   Prior to Admission medications   Medication Sig Start Date End Date Taking? Authorizing Provider  aspirin 81 MG tablet Take 81 mg by mouth daily.   Yes [provider]  atorvastatin (LIPITOR) 40 MG tablet TAKE 1 TABLET DAILY 05/15/19  Yes Stallings, Zoe A, MD  carvedilol (COREG) 6.25 MG tablet TAKE 1 TABLET TWICE DAILY  WITH MEALS 05/15/19  Yes Stallings, Zoe A, MD  lisinopril (ZESTRIL) 20 MG tablet Take 1 tablet (20 mg total) by mouth daily. 03/28/19  Yes Wendie Agreste, MD  meloxicam (MOBIC) 15 MG tablet TAKE 1 TABLET DAILY AS     NEEDED FOR PAIN 04/01/19  Yes Delia Chimes A, MD  metFORMIN (GLUCOPHAGE) 1000 MG tablet TAKE 1 TABLET TWICE DAILY  WITH MEALS 05/15/19  Yes Nolon Rod, Zoe  A, MD  terbinafine (LAMISIL) 250 MG tablet Take 1 tablet (250 mg total) by mouth daily. 03/05/19  Yes Forrest Moron, MD  vitamin B-12 (CYANOCOBALAMIN) 1000 MCG tablet Take 1 tablet (1,000 mcg total) by mouth daily. Patient not taking: Reported on 05/24/2019 03/07/19   Forrest Moron, MD   Social History   Socioeconomic History  . Marital status: Divorced    Spouse name: Not on file  . Number of children: 2  . Years of education: Not on file  . Highest education level: High school graduate  Occupational History  . Not on file  Social Needs  . Financial resource strain: Not hard at all  . Food insecurity    Worry: Never true    Inability: Never  true  . Transportation needs    Medical: No    Non-medical: No  Tobacco Use  . Smoking status: Former Smoker    Packs/day: 0.25    Years: 50.00    Pack years: 12.50    Types: Cigarettes    Quit date: 03/18/2016    Years since quitting: 3.1  . Smokeless tobacco: Never Used  Substance and Sexual Activity  . Alcohol use: Yes    Alcohol/week: 0.0 standard drinks    Comment: 4 beers once a week   . Drug use: No  . Sexual activity: Not on file  Lifestyle  . Physical activity    Days per week: 0 days    Minutes per session: 0 min  . Stress: Not at all  Relationships  . Social connections    Talks on phone: More than three times a week    Gets together: More than three times a week    Attends religious service: Never    Active member of club or organization: No    Attends meetings of clubs or organizations: Never    Relationship status: Divorced  . Intimate partner violence    Fear of current or ex partner: No    Emotionally abused: No    Physically abused: No    Forced sexual activity: No  Other Topics Concern  . Not on file  Social History Narrative  . Not on file    Review of Systems  Constitutional: Negative for fatigue and unexpected weight change.  Eyes: Negative for visual disturbance.  Respiratory: Negative for cough, chest tightness and shortness of breath.   Cardiovascular: Negative for chest pain, palpitations and leg swelling.  Gastrointestinal: Negative for abdominal pain and blood in stool.  Neurological: Negative for dizziness, light-headedness and headaches.     Objective:   Vitals:   05/24/19 0917  BP: 128/70  Pulse: 82  Resp: 16  Temp: 98.1 F (36.7 C)  TempSrc: Oral  SpO2: 98%  Weight: 227 lb (103 kg)  Height: 5\' 11"  (1.803 m)     Physical Exam Vitals signs reviewed.  Constitutional:      Appearance: He is well-developed.  HENT:     Head: Normocephalic and atraumatic.  Eyes:     Pupils: Pupils are equal, round, and reactive to  light.  Neck:     Vascular: No carotid bruit or JVD.  Cardiovascular:     Rate and Rhythm: Normal rate and regular rhythm.     Heart sounds: Normal heart sounds. No murmur.  Pulmonary:     Effort: Pulmonary effort is normal.     Breath sounds: Normal breath sounds. No rales.  Musculoskeletal:       Feet:  Skin:  General: Skin is warm and dry.  Neurological:     Mental Status: He is alert and oriented to person, place, and time.        Assessment & Plan:  Christopher Bauer is a 73 y.o. male . Hyponatremia - Plan: Comprehensive metabolic panel, CANCELED: BMP  -Resolved on previous testing.  Repeat electrolytes with CMP  DM type 2 with diabetic mixed hyperlipidemia ([place]) - Plan: Hemoglobin A1c, Microalbumin / creatinine urine ratio, metFORMIN (GLUCOPHAGE) 1000 MG tablet, Comprehensive metabolic panel, CANCELED: BMP  -Importance of low intensity exercise, weight loss discussed.  Continue Metformin, check A1c, microalbumin creatinine ratio.  Coronary artery disease involving native heart without angina pectoris, unspecified vessel or lesion type - Plan: atorvastatin (LIPITOR) 40 MG tablet  -Asymptomatic, continue to work on blood pressure control, diabetes, continue statin for hyperlipidemia.  Follow-up with cardiology as planned  Essential hypertension - Plan: carvedilol (COREG) 6.25 MG tablet, lisinopril (ZESTRIL) 20 MG tablet  -Stable, continue lisinopril, Coreg.  Labs above.  Hypotension, unspecified hypotension type  -Tolerating current regimen at this time with stable readings.  No changes  Onychomycosis - Plan: Comprehensive metabolic panel  -Still persistent discolored, thickened nails.  Continue Lamisil, check LFTs.  No orders of the defined types were placed in this encounter.  Patient Instructions       If you have lab work done today you will be contacted with your lab results within the next 2 weeks.  If you have not heard from [place] then please contact us. The  fastest way to get your results is to register for My Chart.   IF you received an x-ray today, you will receive an invoice from [facility]iology. Please contact [facility] Radiology at [phone] with questions or concerns regarding your invoice.   IF you received labwork today, you will receive an invoice from [facility]. Please contact [facility] at [phone] with questions or concerns regarding your invoice.   Our billing staff will not be able to assist you with questions regarding bills from these companies.  You will be contacted with the lab results as soon as they are available. The fastest way to get your results is to activate your My Chart account. Instructions are located on the last page of this paperwork. If you have not heard from [place] regarding the results in 2 weeks, please contact this office.           Signed, Merri Ray, MD [facility]

## 2019-05-25 ENCOUNTER — Other Ambulatory Visit: Payer: Self-pay | Admitting: Family Medicine

## 2019-05-25 DIAGNOSIS — I2510 Atherosclerotic heart disease of native coronary artery without angina pectoris: Secondary | ICD-10-CM

## 2019-05-25 DIAGNOSIS — I10 Essential (primary) hypertension: Secondary | ICD-10-CM

## 2019-05-25 DIAGNOSIS — E1169 Type 2 diabetes mellitus with other specified complication: Secondary | ICD-10-CM

## 2019-05-25 DIAGNOSIS — E782 Mixed hyperlipidemia: Secondary | ICD-10-CM

## 2019-05-25 LAB — COMPREHENSIVE METABOLIC PANEL
ALT: 31 IU/L (ref 0–44)
AST: 30 IU/L (ref 0–40)
Albumin/Globulin Ratio: 2 (ref 1.2–2.2)
Albumin: 3.9 g/dL (ref 3.7–4.7)
Alkaline Phosphatase: 118 IU/L — ABNORMAL HIGH (ref 39–117)
BUN/Creatinine Ratio: 15 (ref 10–24)
BUN: 13 mg/dL (ref 8–27)
Bilirubin Total: 0.4 mg/dL (ref 0.0–1.2)
CO2: 19 mmol/L — ABNORMAL LOW (ref 20–29)
Calcium: 9.1 mg/dL (ref 8.6–10.2)
Chloride: 104 mmol/L (ref 96–106)
Creatinine, Ser: 0.86 mg/dL (ref 0.76–1.27)
GFR calc Af Amer: 99 mL/min/{1.73_m2} (ref >59)
GFR calc non Af Amer: 85 mL/min/{1.73_m2} (ref >59)
Globulin, Total: 2 g/dL (ref 1.5–4.5)
Glucose: 131 mg/dL — ABNORMAL HIGH (ref 65–99)
Potassium: 4.5 mmol/L (ref 3.5–5.2)
Sodium: 141 mmol/L (ref 134–144)
Total Protein: 5.9 g/dL — ABNORMAL LOW (ref 6.0–8.5)

## 2019-05-25 LAB — MICROALBUMIN / CREATININE URINE RATIO
Creatinine, Urine: 123.8 mg/dL
Microalb/Creat Ratio: 10 mg/g creat (ref 0–29)
Microalbumin, Urine: 11.8 ug/mL

## 2019-05-25 LAB — HEMOGLOBIN A1C
Est. average glucose Bld gHb Est-mCnc: 134 mg/dL
Hgb A1c MFr Bld: 6.3 % — ABNORMAL HIGH (ref 4.8–5.6)

## 2019-05-25 MED ORDER — MELOXICAM 15 MG PO TABS: 15.0000 mg | ORAL_TABLET | Freq: Every day | ORAL | 1 refills | Status: DC

## 2019-05-25 MED ORDER — ATORVASTATIN CALCIUM 40 MG PO TABS: 40.0000 mg | ORAL_TABLET | Freq: Every day | ORAL | 1 refills | Status: DC

## 2019-05-25 MED ORDER — METFORMIN HCL 1000 MG PO TABS: 1000.0000 mg | ORAL_TABLET | Freq: Two times a day (BID) | ORAL | 1 refills | Status: DC

## 2019-05-25 MED ORDER — LISINOPRIL 20 MG PO TABS: 20.0000 mg | ORAL_TABLET | Freq: Every day | ORAL | 1 refills | Status: DC

## 2019-05-25 MED ORDER — CARVEDILOL 6.25 MG PO TABS: 6.2500 mg | ORAL_TABLET | Freq: Two times a day (BID) | ORAL | 1 refills | Status: DC

## 2019-05-28 ENCOUNTER — Encounter: Payer: Self-pay | Admitting: Family Medicine

## 2019-05-31 DIAGNOSIS — H25811 Combined forms of age-related cataract, right eye: Secondary | ICD-10-CM | POA: Diagnosis not present

## 2019-06-04 DIAGNOSIS — M65331 Trigger finger, right middle finger: Secondary | ICD-10-CM | POA: Diagnosis not present

## 2019-06-04 DIAGNOSIS — Z4789 Encounter for other orthopedic aftercare: Secondary | ICD-10-CM | POA: Diagnosis not present

## 2019-07-24 DIAGNOSIS — H2512 Age-related nuclear cataract, left eye: Secondary | ICD-10-CM | POA: Diagnosis not present

## 2019-07-26 DIAGNOSIS — H25812 Combined forms of age-related cataract, left eye: Secondary | ICD-10-CM | POA: Diagnosis not present

## 2019-08-06 ENCOUNTER — Other Ambulatory Visit: Payer: Self-pay | Admitting: Family Medicine

## 2019-08-06 DIAGNOSIS — I10 Essential (primary) hypertension: Secondary | ICD-10-CM

## 2019-08-06 DIAGNOSIS — I2510 Atherosclerotic heart disease of native coronary artery without angina pectoris: Secondary | ICD-10-CM

## 2019-08-23 ENCOUNTER — Other Ambulatory Visit: Payer: Self-pay | Admitting: Family Medicine

## 2019-08-23 ENCOUNTER — Other Ambulatory Visit: Payer: Self-pay

## 2019-08-23 ENCOUNTER — Ambulatory Visit (INDEPENDENT_AMBULATORY_CARE_PROVIDER_SITE_OTHER): Payer: Medicare HMO | Admitting: Family Medicine

## 2019-08-23 ENCOUNTER — Encounter: Payer: Self-pay | Admitting: Family Medicine

## 2019-08-23 VITALS — BP 148/74 | HR 74 | Temp 98.1°F | Ht 72.0 in | Wt 229.4 lb

## 2019-08-23 DIAGNOSIS — E1169 Type 2 diabetes mellitus with other specified complication: Secondary | ICD-10-CM

## 2019-08-23 DIAGNOSIS — L309 Dermatitis, unspecified: Secondary | ICD-10-CM | POA: Diagnosis not present

## 2019-08-23 DIAGNOSIS — E782 Mixed hyperlipidemia: Secondary | ICD-10-CM | POA: Diagnosis not present

## 2019-08-23 DIAGNOSIS — I2510 Atherosclerotic heart disease of native coronary artery without angina pectoris: Secondary | ICD-10-CM

## 2019-08-23 DIAGNOSIS — I10 Essential (primary) hypertension: Secondary | ICD-10-CM

## 2019-08-23 DIAGNOSIS — E785 Hyperlipidemia, unspecified: Secondary | ICD-10-CM | POA: Diagnosis not present

## 2019-08-23 DIAGNOSIS — I2584 Coronary atherosclerosis due to calcified coronary lesion: Secondary | ICD-10-CM

## 2019-08-23 MED ORDER — LISINOPRIL 10 MG PO TABS: 10.0000 mg | ORAL_TABLET | Freq: Every day | ORAL | 1 refills | Status: DC

## 2019-08-23 MED ORDER — LISINOPRIL 20 MG PO TABS: 20.0000 mg | ORAL_TABLET | Freq: Every day | ORAL | 1 refills | Status: DC

## 2019-08-23 MED ORDER — METFORMIN HCL 1000 MG PO TABS: 1000.0000 mg | ORAL_TABLET | Freq: Two times a day (BID) | ORAL | 1 refills | Status: DC

## 2019-08-23 MED ORDER — CARVEDILOL 6.25 MG PO TABS: 6.2500 mg | ORAL_TABLET | Freq: Two times a day (BID) | ORAL | 1 refills | Status: DC

## 2019-08-23 MED ORDER — TRIAMCINOLONE ACETONIDE 0.1 % EX CREA: 1.0000 "application " | TOPICAL_CREAM | Freq: Two times a day (BID) | CUTANEOUS | 0 refills | Status: DC

## 2019-08-23 MED ORDER — ATORVASTATIN CALCIUM 40 MG PO TABS: 40.0000 mg | ORAL_TABLET | Freq: Every day | ORAL | 1 refills | Status: DC

## 2019-08-23 NOTE — Patient Instructions (Addendum)
  Do not take any nsaids with meloxicam, and would like to minimize that medicine if possible due to risk of kidney and heart issues. Tylenol is safest, but would recommend discussing options with your orthopaedist.   Additional 10mg  lisinopril per day. recheck blood pressure in 3 months.   aveeen o or eucerin lotion to dry patches on hands, streoid cream at night. Keep wounds clean and covered - polysporin ok to open wounds. recehck in next 6 weeks if not improved - sooner if worse.    If you have lab work done today you will be contacted with your lab results within the next 2 weeks.  If you have not heard from [place] then please contact us. The fastest way to get your results is to register for My Chart.   IF you received an x-ray today, you will receive an invoice from [facility]. Please contact [facility] Radiology at [phone] with questions or concerns regarding your invoice.   IF you received labwork today, you will receive an invoice from [place]. Please contact [facility] at [phone] with questions or concerns regarding your invoice.   Our billing staff will not be able to assist you with questions regarding bills from these companies.  You will be contacted with the lab results as soon as they are available. The fastest way to get your results is to activate your My Chart account. Instructions are located on the last page of this paperwork. If you have not heard from [place] regarding the results in 2 weeks, please contact this office.

## 2019-08-23 NOTE — [place] Notes (Signed)
Subjective:  Patient ID: Christopher Bauer, male    DOB: Jan 22, 1946  Age: 74 y.o. MRN: [id number]  CC:  Chief Complaint  Patient presents with  . Follow-up    x55mo BP/diabestes    HPI Christopher Bauer for   Diabetes: Complicated by polyneuropathy, dyslipidemia, CAD.  Cardiologist Dr. GEinar Gip  Improved control at last visit with A1c decreasing from 7.1-6.3.  Continued Metformin 1000 mg twice daily. He is on statin and ACE inhibitor.  No new side effects with meds. Denies missed doses of metformin.  Hand swelling at times, no leg swelling. Hx of carpal tunnel release. No CP, no cough. Still taking mobic daily for hand pain.  Microalbumin: Normal ratio 05/24/2019 Using tylenol as well as  Optho, foot exam, pneumovax: Up-to-date S/p cataract surgery both eyes - not needing glasses.   No home readings.  Less activity, at home more and increased intake with pandemic. Weight up 8 # from October. Plans on increased activity.   Lab Results  Component Value Date   HGBA1C 6.3 (H) 05/24/2019   HGBA1C 7.1 (A) 03/01/2019   HGBA1C 7.3 (A) 09/07/2018   Lab Results  Component Value Date   MICROALBUR 0.3 10/15/2015   LDLCALC 70 03/01/2019   CREATININE 0.86 05/24/2019   Wt Readings from Last 3 Encounters:  08/23/19 229 lb 6.4 oz (104.1 kg)  05/24/19 227 lb (103 kg)  04/12/19 221 lb (100.2 kg)    Hypertension: Carvedilol 6.25 mg twice daily, lisinopril 20 mg daily.  History of CAD, followed by cardiology as above.as needed follow up.  Home readings: 147-150/78-80's. No missed doses.  No missed doses. Frustrating morning with phone company.  Prior hyponatremia and hypokalemia had resolved.  BP Readings from Last 3 Encounters:  08/23/19 (!) 148/74  05/24/19 128/70  04/12/19 110/61   Lab Results  Component Value Date   CREATININE 0.86 05/24/2019   Hyperlipidemia: Lipitor 40 mg daily.no new myalgias/side effects.  Lab Results  Component Value Date   CHOL 158 03/01/2019   HDL 43  03/01/2019   LDLCALC 70 03/01/2019   TRIG 225 (H) 03/01/2019   CHOLHDL 3.7 03/01/2019   Lab Results  Component Value Date   ALT 31 05/24/2019   AST 30 05/24/2019   ALKPHOS 118 (H) 05/24/2019   BILITOT 0.4 05/24/2019   Hand itching/abrasions.  Itches at night - for years.  No lotion - uses neosporin/polysporin.   History Patient Active Problem List   Diagnosis Date Noted  . Former smoker 08/24/2016  . Coronary artery calcification 02/04/2016  . Abnormal liver function test 10/15/2015  . Abnormal CXR 05/01/2014  . Diabetes mellitus type II 04/13/2012  . High cholesterol 04/13/2012  . Osteoarthritis of both knees 04/13/2012   Past Medical History:  Diagnosis Date  . Arthritis   . Diabetes mellitus without complication ([place]   . Hyperlipidemia   . Hypertension   . Prostatitis    Past Surgical History:  Procedure Laterality Date  . APPENDECTOMY     Allergies  Allergen Reactions  . Sulfa Antibiotics Itching   Prior to Admission medications   Medication Sig Start Date End Date Taking? Authorizing Provider  aspirin 81 MG tablet Take 81 mg by mouth daily.    [provider]  atorvastatin (LIPITOR) 40 MG tablet TAKE 1 TABLET DAILY 08/06/19   GWendie Agreste MD  carvedilol (COREG) 6.25 MG tablet TAKE 1 TABLET TWICE DAILY  WITH MEALS 08/06/19   GWendie Agreste MD  lisinopril (ZESTRIL) 20  MG tablet Take 1 tablet (20 mg total) by mouth daily. 05/25/19   Forrest Moron, MD  meloxicam (MOBIC) 15 MG tablet Take 1 tablet (15 mg total) by mouth daily. 05/25/19   Forrest Moron, MD  metFORMIN (GLUCOPHAGE) 1000 MG tablet TAKE 1 TABLET TWICE DAILY  WITH MEALS 08/23/19   Wendie Agreste, MD  terbinafine (LAMISIL) 250 MG tablet Take 1 tablet (250 mg total) by mouth daily. 03/05/19   Forrest Moron, MD  vitamin B-12 (CYANOCOBALAMIN) 1000 MCG tablet Take 1 tablet (1,000 mcg total) by mouth daily. Patient not taking: Reported on 05/24/2019 03/07/19   Forrest Moron, MD     Social History   Socioeconomic History  . Marital status: Divorced    Spouse name: Not on file  . Number of children: 2  . Years of education: Not on file  . Highest education level: High school graduate  Occupational History  . Not on file  Tobacco Use  . Smoking status: Former Smoker    Packs/day: 0.25    Years: 50.00    Pack years: 12.50    Types: Cigarettes    Quit date: 03/18/2016    Years since quitting: 3.4  . Smokeless tobacco: Never Used  Substance and Sexual Activity  . Alcohol use: Yes    Alcohol/week: 0.0 standard drinks    Comment: 4 beers once a week   . Drug use: No  . Sexual activity: Not on file  Other Topics Concern  . Not on file  Social History Narrative  . Not on file   Social Determinants of Health   Financial Resource Strain:   . Difficulty of Paying Living Expenses: Not on file  Food Insecurity:   . Worried About Charity fundraiser in the Last Year: Not on file  . Ran Out of Food in the Last Year: Not on file  Transportation Needs:   . Lack of Transportation (Medical): Not on file  . Lack of Transportation (Non-Medical): Not on file  Physical Activity:   . Days of Exercise per Week: Not on file  . Minutes of Exercise per Session: Not on file  Stress:   . Feeling of Stress : Not on file  Social Connections:   . Frequency of Communication with Friends and Family: Not on file  . Frequency of Social Gatherings with Friends and Family: Not on file  . Attends Religious Services: Not on file  . Active Member of Clubs or Organizations: Not on file  . Attends Archivist Meetings: Not on file  . Marital Status: Not on file  Intimate Partner Violence:   . Fear of Current or Ex-Partner: Not on file  . Emotionally Abused: Not on file  . Physically Abused: Not on file  . Sexually Abused: Not on file    Review of Systems  Constitutional: Negative for fatigue and unexpected weight change.  Eyes: Negative for visual disturbance.   Respiratory: Negative for cough, chest tightness and shortness of breath.   Cardiovascular: Negative for chest pain, palpitations and leg swelling.  Gastrointestinal: Negative for abdominal pain and blood in stool.  Neurological: Negative for dizziness, light-headedness and headaches.     Objective:   Vitals:   08/23/19 1058 08/23/19 1104  BP: (!) 157/77 (!) 148/74  Pulse: 74   Temp: 98.1 F (36.7 C)   TempSrc: Temporal   SpO2: 97%   Weight: 229 lb 6.4 oz (104.1 kg)   Height: 6' (1.829 m)  Physical Exam Vitals reviewed.  Constitutional:      Appearance: He is well-developed.  HENT:     Head: Normocephalic and atraumatic.  Eyes:     Pupils: Pupils are equal, round, and reactive to light.  Neck:     Vascular: No carotid bruit or JVD.  Cardiovascular:     Rate and Rhythm: Normal rate and regular rhythm.     Heart sounds: Normal heart sounds. No murmur.  Pulmonary:     Effort: Pulmonary effort is normal.     Breath sounds: Normal breath sounds. No rales.  Musculoskeletal:     Comments: Abraded areas on hands, no sign of infection. No edema - see photo.   Skin:    General: Skin is warm and dry.  Neurological:     Mental Status: He is alert and oriented to person, place, and time.         Assessment & Plan:  Christopher Bauer is a 74 y.o. male . Hand dermatitis - Plan: triamcinolone cream (KENALOG) 0.1 %  -Possible component of atopic dermatitis/eczema.  Triamcinolone at bedtime, hydrating lotion like Eucerin or Aveeno during the day, RTC precautions, Polysporin to open areas with wound care discussed.  Coronary artery disease involving native heart without angina pectoris, unspecified vessel or lesion type - Plan: atorvastatin (LIPITOR) 40 MG tablet Hyperlipidemia, unspecified hyperlipidemia type - Plan: Lipid panel Coronary artery calcification - Plan: CMP14+EGFR, Lipid panel  -Asymptomatic, follow-up with cardiology planned as needed only.  Tolerating Lipitor,  continue same.  Essential hypertension - Plan: CMP14+EGFR, lisinopril (ZESTRIL) 10 MG tablet, carvedilol (COREG) 6.25 MG tablet, lisinopril (ZESTRIL) 20 MG tablet  -Decreased control, add additional 10 mg of lisinopril for total 30 mg dosing, continue carvedilol same dose for now.  Recheck 3 months  DM type 2 with diabetic mixed hyperlipidemia ([place]) - Plan: Hemoglobin A1c, metFORMIN (GLUCOPHAGE) 1000 MG tablet  -Tolerating metformin, check A1c.  Meds ordered this encounter  Medications  . lisinopril (ZESTRIL) 10 MG tablet    Sig: Take 1 tablet (10 mg total) by mouth daily.    Dispense:  90 tablet    Refill:  1  . atorvastatin (LIPITOR) 40 MG tablet    Sig: Take 1 tablet (40 mg total) by mouth daily.    Dispense:  90 tablet    Refill:  1  . carvedilol (COREG) 6.25 MG tablet    Sig: Take 1 tablet (6.25 mg total) by mouth 2 (two) times daily with a meal.    Dispense:  180 tablet    Refill:  1  . metFORMIN (GLUCOPHAGE) 1000 MG tablet    Sig: Take 1 tablet (1,000 mg total) by mouth 2 (two) times daily with a meal.    Dispense:  180 tablet    Refill:  1  . lisinopril (ZESTRIL) 20 MG tablet    Sig: Take 1 tablet (20 mg total) by mouth daily.    Dispense:  90 tablet    Refill:  1  . triamcinolone cream (KENALOG) 0.1 %    Sig: Apply 1 application topically 2 (two) times daily. Ok to start at night. To hands.    Dispense:  30 g    Refill:  0   Patient Instructions    Do not take any nsaids with meloxicam, and would like to minimize that medicine if possible due to risk of kidney and heart issues. Tylenol is safest, but would recommend discussing options with your orthopaedist.   Additional 39m lisinopril per day.  recheck blood pressure in 3 months.   aveeen o or eucerin lotion to dry patches on hands, streoid cream at night. Keep wounds clean and covered - polysporin ok to open wounds. recehck in next 6 weeks if not improved - sooner if worse.    If you have lab work done today  you will be contacted with your lab results within the next 2 weeks.  If you have not heard from [place] then please contact us. The fastest way to get your results is to register for My Chart.   IF you received an x-ray today, you will receive an invoice from [facility] Radiology. Please contact [facility] Radiology at [phone] with questions or concerns regarding your invoice.   IF you received labwork today, you will receive an invoice from [place]. Please contact [facility] at [phone] with questions or concerns regarding your invoice.   Our billing staff will not be able to assist you with questions regarding bills from these companies.  You will be contacted with the lab results as soon as they are available. The fastest way to get your results is to activate your My Chart account. Instructions are located on the last page of this paperwork. If you have not heard from [place] regarding the results in 2 weeks, please contact this office.         Signed, Merri Ray, MD [facility]

## 2019-08-24 LAB — LIPID PANEL
Chol/HDL Ratio: 2.7 ratio (ref 0.0–5.0)
Cholesterol, Total: 132 mg/dL (ref 100–199)
HDL: 49 mg/dL (ref >39)
LDL Chol Calc (NIH): 65 mg/dL (ref 0–99)
Triglycerides: 99 mg/dL (ref 0–149)
VLDL Cholesterol Cal: 18 mg/dL (ref 5–40)

## 2019-08-24 LAB — CMP14+EGFR
ALT: 15 IU/L (ref 0–44)
AST: 18 IU/L (ref 0–40)
Albumin/Globulin Ratio: 1.8 (ref 1.2–2.2)
Albumin: 4 g/dL (ref 3.7–4.7)
Alkaline Phosphatase: 129 IU/L — ABNORMAL HIGH (ref 39–117)
BUN/Creatinine Ratio: 19 (ref 10–24)
BUN: 12 mg/dL (ref 8–27)
Bilirubin Total: 0.9 mg/dL (ref 0.0–1.2)
CO2: 20 mmol/L (ref 20–29)
Calcium: 9.4 mg/dL (ref 8.6–10.2)
Chloride: 104 mmol/L (ref 96–106)
Creatinine, Ser: 0.64 mg/dL — ABNORMAL LOW (ref 0.76–1.27)
GFR calc Af Amer: 112 mL/min/{1.73_m2} (ref >59)
GFR calc non Af Amer: 96 mL/min/{1.73_m2} (ref >59)
Globulin, Total: 2.2 g/dL (ref 1.5–4.5)
Glucose: 119 mg/dL — ABNORMAL HIGH (ref 65–99)
Potassium: 4.5 mmol/L (ref 3.5–5.2)
Sodium: 141 mmol/L (ref 134–144)
Total Protein: 6.2 g/dL (ref 6.0–8.5)

## 2019-08-24 LAB — HEMOGLOBIN A1C
Est. average glucose Bld gHb Est-mCnc: 148 mg/dL
Hgb A1c MFr Bld: 6.8 % — ABNORMAL HIGH (ref 4.8–5.6)

## 2019-08-26 ENCOUNTER — Other Ambulatory Visit: Payer: Self-pay

## 2019-08-26 ENCOUNTER — Ambulatory Visit: Payer: Medicare HMO | Attending: Internal Medicine

## 2019-08-26 DIAGNOSIS — Z23 Encounter for immunization: Secondary | ICD-10-CM

## 2019-08-26 NOTE — [place] Notes (Signed)
   Covid-19 Vaccination Clinic  Name:  Christopher Bauer    MRN: [id number] DOB: [birth year]  08/26/2019  [name] was observed post Covid-19 immunization for 15 minutes without incidence. He was provided with Vaccine Information Sheet and instruction to access the V-Safe system.   [name] was instructed to call 911 with any severe reactions post vaccine: Marland Kitchen Difficulty breathing  . Swelling of your face and throat  . A fast heartbeat  . A bad rash all over your body  . Dizziness and weakness    Immunizations Administered    Name Date Dose VIS Date Route   Moderna COVID-19 Vaccine 08/26/2019  1:17 PM 0.5 mL 06/11/2019 Intramuscular   Manufacturer: Moderna   Lot: [id number]   LymanPO:[id number]

## 2019-08-27 DIAGNOSIS — H5213 Myopia, bilateral: Secondary | ICD-10-CM | POA: Diagnosis not present

## 2019-08-27 DIAGNOSIS — Z961 Presence of intraocular lens: Secondary | ICD-10-CM | POA: Diagnosis not present

## 2019-08-27 DIAGNOSIS — H52202 Unspecified astigmatism, left eye: Secondary | ICD-10-CM | POA: Diagnosis not present

## 2019-08-27 DIAGNOSIS — H524 Presbyopia: Secondary | ICD-10-CM | POA: Diagnosis not present

## 2019-08-30 ENCOUNTER — Ambulatory Visit: Payer: Medicare HMO | Admitting: Family Medicine

## 2019-09-24 ENCOUNTER — Ambulatory Visit: Payer: Medicare HMO | Attending: Internal Medicine

## 2019-09-24 DIAGNOSIS — Z23 Encounter for immunization: Secondary | ICD-10-CM

## 2019-09-24 NOTE — [place] Notes (Signed)
   Covid-19 Vaccination Clinic  Name:  Christopher Bauer    MRN: [id number] DOB: 11/09/1945  09/24/2019  [name] was observed post Covid-19 immunization for 15 minutes without incident. He was provided with Vaccine Information Sheet and instruction to access the V-Safe system.   [name] was instructed to call 911 with any severe reactions post vaccine: Marland Kitchen Difficulty breathing  . Swelling of face and throat  . A fast heartbeat  . A bad rash all over body  . Dizziness and weakness   Immunizations Administered    Name Date Dose VIS Date Route   Moderna COVID-19 Vaccine 09/24/2019 11:11 AM 0.5 mL 06/11/2019 Intramuscular   Manufacturer: Moderna   Lot: [id number]   [place] GreenPO:[id number]

## 2019-11-14 ENCOUNTER — Other Ambulatory Visit: Payer: Self-pay | Admitting: Family Medicine

## 2019-11-14 DIAGNOSIS — E782 Mixed hyperlipidemia: Secondary | ICD-10-CM

## 2019-11-14 DIAGNOSIS — E1169 Type 2 diabetes mellitus with other specified complication: Secondary | ICD-10-CM

## 2019-11-22 ENCOUNTER — Ambulatory Visit: Payer: Medicare HMO | Admitting: Family Medicine

## 2019-11-25 ENCOUNTER — Other Ambulatory Visit: Payer: Self-pay

## 2019-11-25 ENCOUNTER — Encounter: Payer: Self-pay | Admitting: Family Medicine

## 2019-11-25 ENCOUNTER — Ambulatory Visit (INDEPENDENT_AMBULATORY_CARE_PROVIDER_SITE_OTHER): Payer: Medicare HMO | Admitting: Family Medicine

## 2019-11-25 VITALS — BP 142/78 | HR 75 | Temp 98.1°F | Ht 72.0 in | Wt 236.4 lb

## 2019-11-25 DIAGNOSIS — I10 Essential (primary) hypertension: Secondary | ICD-10-CM

## 2019-11-25 DIAGNOSIS — E782 Mixed hyperlipidemia: Secondary | ICD-10-CM | POA: Diagnosis not present

## 2019-11-25 DIAGNOSIS — E1169 Type 2 diabetes mellitus with other specified complication: Secondary | ICD-10-CM

## 2019-11-25 NOTE — Progress Notes (Signed)
Subjective:  Patient ID: Christopher Bauer, male    DOB: Jul 06, 1946  Age: 74 y.o. MRN: [id number]  CC:  Chief Complaint  Patient presents with  . Hypertension  . Medical Management of Chronic Issues    6 m f.u   . Diabetes    HPI Christopher Bauer presents for   Hypertension: Lisinopril 30 mg daily (20mg  increased to 30mg  in February).  carvedilol 6.25 mg twice daily. Did have some possible mix up with combining meds earlier, but not in past few days.  Some aggravation with traffic today. No side effects with current dose.  Some puffiness in fingers at times, no leg swelling/dyspnea.  Home readings:none recent. BP Readings from Last 3 Encounters:  11/25/19 (!) 142/78  08/23/19 (!) 148/74  05/24/19 128/70   Lab Results  Component Value Date   CREATININE 0.64 (L) 08/23/2019    Hyperlipidemia: Lipitor 40 mg daily, aspirin 81 mg daily. Lab Results  Component Value Date   CHOL 132 08/23/2019   HDL 49 08/23/2019   LDLCALC 65 08/23/2019   TRIG 99 08/23/2019   CHOLHDL 2.7 08/23/2019   Lab Results  Component Value Date   ALT 15 08/23/2019   AST 18 08/23/2019   ALKPHOS 129 (H) 08/23/2019   BILITOT 0.9 08/23/2019    Diabetes: Complicated by hyperlipidemia, CAD. Metformin 1000 mg twice daily No new side effects.  No home readings.  Microalbumin: Normal ratio 05/24/2019 Optho, foot exam, pneumovax: Ophthalmology February 16.  Up-to-date. Exercise has decreased and some room for diet changes.   Lab Results  Component Value Date   HGBA1C 6.8 (H) 08/23/2019   HGBA1C 6.3 (H) 05/24/2019   HGBA1C 7.1 (A) 03/01/2019   Lab Results  Component Value Date   MICROALBUR 0.3 10/15/2015   LDLCALC 65 08/23/2019   CREATININE 0.64 (L) 08/23/2019   Wt Readings from Last 3 Encounters:  11/25/19 236 lb 6.4 oz (107.2 kg)  08/23/19 229 lb 6.4 oz (104.1 kg)  05/24/19 227 lb (103 kg)   Depression screen [facility] 2/9 11/25/2019 08/23/2019 05/24/2019 04/12/2019 03/28/2019  Decreased Interest 0 0 0 0  0  Down, Depressed, Hopeless 0 0 0 0 0  PHQ - 2 Score 0 0 0 0 0  rare irritation, not depressed. Some decreased control of work hours.   History Patient Active Problem List   Diagnosis Date Noted  . Former smoker 08/24/2016  . Coronary artery calcification 02/04/2016  . Abnormal liver function test 10/15/2015  . Abnormal CXR 05/01/2014  . Diabetes mellitus type II 04/13/2012  . High cholesterol 04/13/2012  . Osteoarthritis of both knees 04/13/2012   Past Medical History:  Diagnosis Date  . Arthritis   . Diabetes mellitus without complication ([place])   . Hyperlipidemia   . Hypertension   . Prostatitis    Past Surgical History:  Procedure Laterality Date  . APPENDECTOMY     Allergies  Allergen Reactions  . Sulfa Antibiotics Itching   Prior to Admission medications   Medication Sig Start Date End Date Taking? Authorizing Provider  aspirin 81 MG tablet Take 81 mg by mouth daily.   Yes [provider]  atorvastatin (LIPITOR) 40 MG tablet Take 1 tablet (40 mg total) by mouth daily. 08/23/19  Yes Wendie Agreste, MD  carvedilol (COREG) 6.25 MG tablet Take 1 tablet (6.25 mg total) by mouth 2 (two) times daily with a meal. 08/23/19  Yes Wendie Agreste, MD  lisinopril (ZESTRIL) 10 MG tablet Take 1 tablet (10 mg  total) by mouth daily. 08/23/19  Yes Wendie Agreste, MD  lisinopril (ZESTRIL) 20 MG tablet Take 1 tablet (20 mg total) by mouth daily. 08/23/19  Yes Wendie Agreste, MD  meloxicam (MOBIC) 15 MG tablet Take 1 tablet (15 mg total) by mouth daily. 05/25/19  Yes Delia Chimes A, MD  metFORMIN (GLUCOPHAGE) 1000 MG tablet TAKE 1 TABLET TWICE DAILY  WITH MEALS 11/14/19  Yes Wendie Agreste, MD  triamcinolone cream (KENALOG) 0.1 % Apply 1 application topically 2 (two) times daily. Ok to start at night. To hands. 08/23/19  Yes Wendie Agreste, MD   Social History   Socioeconomic History  . Marital status: Divorced    Spouse name: Not on file  . Number of children: 2   . Years of education: Not on file  . Highest education level: High school graduate  Occupational History  . Not on file  Tobacco Use  . Smoking status: Former Smoker    Packs/day: 0.25    Years: 50.00    Pack years: 12.50    Types: Cigarettes    Quit date: 03/18/2016    Years since quitting: 3.6  . Smokeless tobacco: Never Used  Substance and Sexual Activity  . Alcohol use: Yes    Alcohol/week: 0.0 standard drinks    Comment: 4 beers once a week   . Drug use: No  . Sexual activity: Not on file  Other Topics Concern  . Not on file  Social History Narrative  . Not on file   Social Determinants of Health   Financial Resource Strain:   . Difficulty of Paying Living Expenses:   Food Insecurity:   . Worried About Charity fundraiser in the Last Year:   . Arboriculturist in the Last Year:   Transportation Needs:   . Film/video editor (Medical):   Marland Kitchen Lack of Transportation (Non-Medical):   Physical Activity:   . Days of Exercise per Week:   . Minutes of Exercise per Session:   Stress:   . Feeling of Stress :   Social Connections:   . Frequency of Communication with Friends and Family:   . Frequency of Social Gatherings with Friends and Family:   . Attends Religious Services:   . Active Member of Clubs or Organizations:   . Attends Archivist Meetings:   Marland Kitchen Marital Status:   Intimate Partner Violence:   . Fear of Current or Ex-Partner:   . Emotionally Abused:   Marland Kitchen Physically Abused:   . Sexually Abused:     Review of Systems  Constitutional: Negative for fatigue and unexpected weight change.  Eyes: Negative for visual disturbance.  Respiratory: Negative for cough, chest tightness and shortness of breath.   Cardiovascular: Negative for chest pain, palpitations and leg swelling.  Gastrointestinal: Negative for abdominal pain and blood in stool.  Neurological: Negative for dizziness, light-headedness and headaches.     Objective:   Vitals:   11/25/19  1415 11/25/19 1426 11/25/19 1456  BP: (!) 154/75 (!) 152/70 (!) 142/78  Pulse: 75    Temp: 98.1 F (36.7 C)    TempSrc: Temporal    SpO2: 95%    Weight: 236 lb 6.4 oz (107.2 kg)    Height: 6' (1.829 m)       Physical Exam Vitals reviewed.  Constitutional:      Appearance: He is well-developed.  HENT:     Head: Normocephalic and atraumatic.  Eyes:  Pupils: Pupils are equal, round, and reactive to light.  Neck:     Vascular: No carotid bruit or JVD.  Cardiovascular:     Rate and Rhythm: Normal rate and regular rhythm.     Heart sounds: Normal heart sounds. No murmur.  Pulmonary:     Effort: Pulmonary effort is normal.     Breath sounds: Normal breath sounds. No rales.  Skin:    General: Skin is warm and dry.  Neurological:     Mental Status: He is alert and oriented to person, place, and time.  Psychiatric:        Mood and Affect: Mood normal.        Behavior: Behavior normal.        Thought Content: Thought content normal.        Assessment & Plan:  Christopher Bauer is a 74 y.o. male . DM type 2 with diabetic mixed hyperlipidemia ([place]) - Plan: Hemoglobin A1c  -A1c under 7 at last visit but some trend upward.  Repeat testing today.  No med changes for now.  Essential hypertension - Plan: Basic metabolic panel  -Borderline control, improved with repeat testing.  Monitor home readings with option of 40 mg of lisinopril.  Check BMP for renal function today at higher dose.  Episodic irritation, may be stress with pandemic and change in work hours flexibility.  Denies depression, follow-up if more persistent or worsening symptoms.  No orders of the defined types were placed in this encounter.  Patient Instructions   Blood pressure borderline elevated here today, but on third test it is closer to normal.  Monitor your readings at home and if those numbers are remaining over 140 on the top number or over 90 on the bottom number, let me know as we may increase lisinopril  further.  Continue 30 mg/day total for now.  Continue carvedilol same dose for now.  Recheck 3 months.  Let me know if there are questions sooner    If you have lab work done today you will be contacted with your lab results within the next 2 weeks.  If you have not heard from [place] then please contact us. The fastest way to get your results is to register for My Chart.   IF you received an x-ray today, you will receive an invoice from [facility]. Please contact [facility] at [phone] with questions or concerns regarding your invoice.   IF you received labwork today, you will receive an invoice from [place]. Please contact [facility] at [phone] with questions or concerns regarding your invoice.   Our billing staff will not be able to assist you with questions regarding bills from these companies.  You will be contacted with the lab results as soon as they are available. The fastest way to get your results is to activate your My Chart account. Instructions are located on the last page of this paperwork. If you have not heard from [place] regarding the results in 2 weeks, please contact this office.          Signed, Merri Ray, MD [facility]

## 2019-11-25 NOTE — Patient Instructions (Addendum)
Blood pressure borderline elevated here today, but on third test it is closer to normal.  Monitor your readings at home and if those numbers are remaining over 140 on the top number or over 90 on the bottom number, let me know as we may increase lisinopril further.  Continue 30 mg/day total for now.  Continue carvedilol same dose for now.  Recheck 3 months.  Let me know if there are questions sooner    If you have lab work done today you will be contacted with your lab results within the next 2 weeks.  If you have not heard from [place] then please contact us. The fastest way to get your results is to register for My Chart.   IF you received an x-ray today, you will receive an invoice from [facility]. Please contact [facility] at [phone] with questions or concerns regarding your invoice.   IF you received labwork today, you will receive an invoice from [place]. Please contact [facility] at [phone] with questions or concerns regarding your invoice.   Our billing staff will not be able to assist you with questions regarding bills from these companies.  You will be contacted with the lab results as soon as they are available. The fastest way to get your results is to activate your My Chart account. Instructions are located on the last page of this paperwork. If you have not heard from [place] regarding the results in 2 weeks, please contact this office.

## 2019-11-26 LAB — BASIC METABOLIC PANEL
BUN/Creatinine Ratio: 20 (ref 10–24)
BUN: 14 mg/dL (ref 8–27)
CO2: 21 mmol/L (ref 20–29)
Calcium: 9.2 mg/dL (ref 8.6–10.2)
Chloride: 105 mmol/L (ref 96–106)
Creatinine, Ser: 0.7 mg/dL — ABNORMAL LOW (ref 0.76–1.27)
GFR calc Af Amer: 107 mL/min/{1.73_m2} (ref >59)
GFR calc non Af Amer: 93 mL/min/{1.73_m2} (ref >59)
Glucose: 102 mg/dL — ABNORMAL HIGH (ref 65–99)
Potassium: 4.4 mmol/L (ref 3.5–5.2)
Sodium: 139 mmol/L (ref 134–144)

## 2019-11-26 LAB — HEMOGLOBIN A1C
Est. average glucose Bld gHb Est-mCnc: 140 mg/dL
Hgb A1c MFr Bld: 6.5 % — ABNORMAL HIGH (ref 4.8–5.6)

## 2020-02-05 ENCOUNTER — Other Ambulatory Visit: Payer: Self-pay | Admitting: Family Medicine

## 2020-02-05 DIAGNOSIS — I10 Essential (primary) hypertension: Secondary | ICD-10-CM

## 2020-02-05 DIAGNOSIS — E1169 Type 2 diabetes mellitus with other specified complication: Secondary | ICD-10-CM

## 2020-02-05 DIAGNOSIS — I2510 Atherosclerotic heart disease of native coronary artery without angina pectoris: Secondary | ICD-10-CM

## 2020-02-05 NOTE — Telephone Encounter (Signed)
Requested Prescriptions  Pending Prescriptions Disp Refills   metFORMIN (GLUCOPHAGE) 1000 MG tablet [Pharmacy Med Name: METFORMIN TAB 1000MG] 180 tablet 0    Sig: TAKE 1 TABLET TWICE DAILY  WITH MEALS     Endocrinology:  Diabetes - Biguanides Failed - 02/05/2020  2:19 AM      Failed - Cr in normal range and within 360 days    Creat  Date Value Ref Range Status  05/25/2016 0.66 (L) 0.70 - 1.18 mg/dL Final    Comment:      For patients > or = 75 years of age: The upper reference limit for Creatinine is approximately 13% higher for people identified as African-American.      Creatinine, Ser  Date Value Ref Range Status  11/25/2019 0.70 (L) 0.76 - 1.27 mg/dL Final         Passed - HBA1C is between 0 and 7.9 and within 180 days    Hgb A1c MFr Bld  Date Value Ref Range Status  11/25/2019 6.5 (H) 4.8 - 5.6 % Final    Comment:             Prediabetes: 5.7 - 6.4          Diabetes: >6.4          Glycemic control for adults with diabetes: <7.0          Passed - eGFR in normal range and within 360 days    GFR, Est African American  Date Value Ref Range Status  02/04/2016 >89 >=60 mL/min Final   GFR calc Af Amer  Date Value Ref Range Status  11/25/2019 107 >59 mL/min/1.73 Final    Comment:    **[facility] currently reports eGFR in compliance with the current**   recommendations of the [employer]. [facility] will   update reporting as new guidelines are published from the NKF-ASN   Task force.    GFR, Est Non African American  Date Value Ref Range Status  02/04/2016 89 >=60 mL/min Final   GFR calc non Af Amer  Date Value Ref Range Status  11/25/2019 93 >59 mL/min/1.73 Final         Passed - Valid encounter within last 6 months    Recent Outpatient Visits          2 months ago DM type 2 with diabetic mixed hyperlipidemia ([place])   [facility], Ranell Patrick, MD   5 months ago Hand dermatitis   [facility], Ranell Patrick, MD   8  months ago Hyponatremia   [facility], Ranell Patrick, MD   9 months ago Onychomycosis   [facility], Ranell Patrick, MD   10 months ago Hyponatremia   [facility], Ranell Patrick, MD      Future Appointments            In 3 weeks Wendie Agreste, MD [facility] at [place], [place]            lisinopril (ZESTRIL) 10 MG tablet [Pharmacy Med Name: LISINOPRIL TAB 10MG] 90 tablet 1    Sig: TAKE 1 TABLET DAILY     Cardiovascular:  ACE Inhibitors Failed - 02/05/2020  2:19 AM      Failed - Cr in normal range and within 180 days    Creat  Date Value Ref Range Status  05/25/2016 0.66 (L) 0.70 - 1.18 mg/dL Final    Comment:  For patients > or = 75 years of age: The upper reference limit for Creatinine is approximately 13% higher for people identified as African-American.      Creatinine, Ser  Date Value Ref Range Status  11/25/2019 0.70 (L) 0.76 - 1.27 mg/dL Final         Failed - Last BP in normal range    BP Readings from Last 1 Encounters:  11/25/19 (!) 142/78         Passed - K in normal range and within 180 days    Potassium  Date Value Ref Range Status  11/25/2019 4.4 3.5 - 5.2 mmol/L Final         Passed - Patient is not pregnant      Passed - Valid encounter within last 6 months    Recent Outpatient Visits          2 months ago DM type 2 with diabetic mixed hyperlipidemia ([place])   [facility], Ranell Patrick, MD   5 months ago Hand dermatitis   [facility], Ranell Patrick, MD   8 months ago Hyponatremia   [facility], Ranell Patrick, MD   9 months ago Onychomycosis   [facility], Ranell Patrick, MD   10 months ago Hyponatremia   [facility], Ranell Patrick, MD      Future Appointments            In 3 weeks Wendie Agreste, MD Primary [facility] at [place], [place]            atorvastatin (LIPITOR) 40 MG tablet [Pharmacy Med Name: ATORVASTATIN TAB  40MG] 90 tablet 1    Sig: TAKE 1 TABLET DAILY     Cardiovascular:  Antilipid - Statins Passed - 02/05/2020  2:19 AM      Passed - Total Cholesterol in normal range and within 360 days    Cholesterol, Total  Date Value Ref Range Status  08/23/2019 132 100 - 199 mg/dL Final         Passed - LDL in normal range and within 360 days    LDL Chol Calc (NIH)  Date Value Ref Range Status  08/23/2019 65 0 - 99 mg/dL Final         Passed - HDL in normal range and within 360 days    HDL  Date Value Ref Range Status  08/23/2019 49 >39 mg/dL Final         Passed - Triglycerides in normal range and within 360 days    Triglycerides  Date Value Ref Range Status  08/23/2019 99 0 - 149 mg/dL Final         Passed - Patient is not pregnant      Passed - Valid encounter within last 12 months    Recent Outpatient Visits          2 months ago DM type 2 with diabetic mixed hyperlipidemia ([place])   [facility], Ranell Patrick, MD   5 months ago Hand dermatitis   [facility], Ranell Patrick, MD   8 months ago Hyponatremia   [facility], Ranell Patrick, MD   9 months ago Onychomycosis   [facility], Ranell Patrick, MD   10 months ago Hyponatremia   [facility], Ranell Patrick, MD      Future Appointments  In 3 weeks Wendie Agreste, MD Primary [facility] at [place], [place]            lisinopril (ZESTRIL) 20 MG tablet [Pharmacy Med Name: LISINOPRIL TAB 20MG] 90 tablet 1    Sig: TAKE 1 TABLET DAILY     Cardiovascular:  ACE Inhibitors Failed - 02/05/2020  2:19 AM      Failed - Cr in normal range and within 180 days    Creat  Date Value Ref Range Status  05/25/2016 0.66 (L) 0.70 - 1.18 mg/dL Final    Comment:      For patients > or = 75 years of age: The upper reference limit for Creatinine is approximately 13% higher for people identified as African-American.      Creatinine, Ser  Date Value Ref Range Status   11/25/2019 0.70 (L) 0.76 - 1.27 mg/dL Final         Failed - Last BP in normal range    BP Readings from Last 1 Encounters:  11/25/19 (!) 142/78         Passed - K in normal range and within 180 days    Potassium  Date Value Ref Range Status  11/25/2019 4.4 3.5 - 5.2 mmol/L Final         Passed - Patient is not pregnant      Passed - Valid encounter within last 6 months    Recent Outpatient Visits          2 months ago DM type 2 with diabetic mixed hyperlipidemia [facility])   [facility], Ranell Patrick, MD   5 months ago Hand dermatitis   [facility], Ranell Patrick, MD   8 months ago Hyponatremia   [facility], Ranell Patrick, MD   9 months ago Onychomycosis   [facility] at [place], MD   10 months ago Hyponatremia   [facility], Ranell Patrick, MD      Future Appointments            In 3 weeks Carlota Raspberry Ranell Patrick, MD Primary Care at [place], [facility]

## 2020-02-22 ENCOUNTER — Encounter: Payer: Self-pay | Admitting: Family Medicine

## 2020-02-24 MED ORDER — MELOXICAM 15 MG PO TABS: 15.0000 mg | ORAL_TABLET | Freq: Every day | ORAL | 1 refills | Status: DC

## 2020-02-25 DIAGNOSIS — Z961 Presence of intraocular lens: Secondary | ICD-10-CM | POA: Diagnosis not present

## 2020-02-25 DIAGNOSIS — E119 Type 2 diabetes mellitus without complications: Secondary | ICD-10-CM | POA: Diagnosis not present

## 2020-02-25 LAB — HM DIABETES EYE EXAM

## 2020-02-26 ENCOUNTER — Ambulatory Visit (INDEPENDENT_AMBULATORY_CARE_PROVIDER_SITE_OTHER): Payer: Medicare HMO | Admitting: Family Medicine

## 2020-02-26 ENCOUNTER — Other Ambulatory Visit: Payer: Self-pay

## 2020-02-26 VITALS — BP 150/79 | HR 84 | Temp 97.9°F | Resp 16 | Ht 72.0 in | Wt 237.0 lb

## 2020-02-26 DIAGNOSIS — I10 Essential (primary) hypertension: Secondary | ICD-10-CM | POA: Diagnosis not present

## 2020-02-26 DIAGNOSIS — E785 Hyperlipidemia, unspecified: Secondary | ICD-10-CM | POA: Diagnosis not present

## 2020-02-26 DIAGNOSIS — E782 Mixed hyperlipidemia: Secondary | ICD-10-CM

## 2020-02-26 DIAGNOSIS — E1169 Type 2 diabetes mellitus with other specified complication: Secondary | ICD-10-CM

## 2020-02-26 DIAGNOSIS — Z23 Encounter for immunization: Secondary | ICD-10-CM

## 2020-02-26 NOTE — Patient Instructions (Addendum)
  Please follow up after ortho appointment to discuss mobic prescription.   Keep a record of your blood pressures outside of the office in next 2 weeks to decide on med changes.   Thanks for coming in today.   If you have lab work done today you will be contacted with your lab results within the next 2 weeks.  If you have not heard from [place] then please contact us. The fastest way to get your results is to register for My Chart.   IF you received an x-ray today, you will receive an invoice from [facility]. Please contact [facility] at [phone] with questions or concerns regarding your invoice.   IF you received labwork today, you will receive an invoice from [place]. Please contact [facility] at [phone] with questions or concerns regarding your invoice.   Our billing staff will not be able to assist you with questions regarding bills from these companies.  You will be contacted with the lab results as soon as they are available. The fastest way to get your results is to activate your My Chart account. Instructions are located on the last page of this paperwork. If you have not heard from [place] regarding the results in 2 weeks, please contact this office.

## 2020-02-26 NOTE — Progress Notes (Signed)
Subjective:  Patient ID: Christopher Bauer, male    DOB: 02-25-1946  Age: 74 y.o. MRN: [id number]  CC:  Chief Complaint  Patient presents with  . Diabetes    pt admits he has not made any dietary changes, has tried to include more exercise, but otherwise no change   . Hyperlipidemia    pt has been trying to exercise some but reports no dietary changes     HPI Christopher Bauer presents for   Diabetes: Metformin 1000mg  BID.  No home readings, no known symptomatic lows.   On ACE- I, statin. Microalbumin: normal in 05/2019. Optho, foot exam, pneumovax: up to date.  Flu vaccine given today.  Has appt with hand/ortho soon - taking mobic 15 daily for knee arthritis primarily and in hands.  Lab Results  Component Value Date   HGBA1C 7.0 (H) 02/26/2020   HGBA1C 6.5 (H) 11/25/2019   HGBA1C 6.8 (H) 08/23/2019   Lab Results  Component Value Date   MICROALBUR 0.3 10/15/2015   Christopher Bauer WILL FOLLOW 02/26/2020   Christopher Bauer WILL FOLLOW 02/26/2020   Hypertension: Currently on lisinopril 30mg  qd, coreg 6.25mg  BID,. No missed doses. Thinks lower at home.  Home readings: BP Readings from Last 3 Encounters:  02/26/20 (!) 150/79  11/25/19 (!) 142/78  08/23/19 (!) 148/74   Lab Results  Component Value Date   Christopher Bauer WILL FOLLOW 02/26/2020   Hyperlipidemia: Lipitor 40mg  qd. 3 vessel calcification on prior CT with cardiology in 2017 - Dr Einar Gip.   Lab Results  Component Value Date   CHOL WILL FOLLOW 02/26/2020   HDL WILL FOLLOW 02/26/2020   Christopher Bauer WILL FOLLOW 02/26/2020   Christopher Bauer WILL FOLLOW 02/26/2020   Christopher Bauer WILL FOLLOW 02/26/2020   Lab Results  Component Value Date   Christopher Bauer WILL FOLLOW 02/26/2020   Christopher Bauer WILL FOLLOW 02/26/2020   Christopher Bauer WILL FOLLOW 02/26/2020   Christopher Bauer WILL FOLLOW 02/26/2020       History Patient Active Problem List   Diagnosis Date Noted  . Former smoker 08/24/2016  . Coronary artery calcification 02/04/2016  . Abnormal liver function test 10/15/2015  .  Abnormal CXR 05/01/2014  . Diabetes mellitus type II 04/13/2012  . High cholesterol 04/13/2012  . Osteoarthritis of both knees 04/13/2012   Past Medical History:  Diagnosis Date  . Arthritis   . Diabetes mellitus without complication ([place])   . Hyperlipidemia   . Hypertension   . Prostatitis    Past Surgical History:  Procedure Laterality Date  . APPENDECTOMY     Allergies  Allergen Reactions  . Sulfa Antibiotics Itching   Prior to Admission medications   Medication Sig Start Date End Date Taking? Authorizing Provider  aspirin 81 MG tablet Take 81 mg by mouth daily.   Yes [provider]  atorvastatin (LIPITOR) 40 MG tablet TAKE 1 TABLET DAILY 02/05/20  Yes Wendie Agreste, MD  carvedilol (COREG) 6.25 MG tablet Take 1 tablet (6.25 mg total) by mouth 2 (two) times daily with a meal. 08/23/19  Yes Wendie Agreste, MD  lisinopril (ZESTRIL) 10 MG tablet TAKE 1 TABLET DAILY 02/05/20  Yes Wendie Agreste, MD  lisinopril (ZESTRIL) 20 MG tablet TAKE 1 TABLET DAILY 02/05/20  Yes Wendie Agreste, MD  meloxicam (MOBIC) 15 MG tablet Take 1 tablet (15 mg total) by mouth daily. 02/24/20  Yes Wendie Agreste, MD  metFORMIN (GLUCOPHAGE) 1000 MG tablet TAKE 1 TABLET TWICE DAILY  WITH MEALS 02/05/20  Yes Wendie Agreste, MD  triamcinolone cream (KENALOG) 0.1 % Apply 1 application topically 2 (two) times daily. Ok to start at night. To hands. 08/23/19   Wendie Agreste, MD   Social History   Socioeconomic History  . Marital status: Divorced    Spouse name: Not on file  . Number of children: 2  . Years of education: Not on file  . Highest education level: High school graduate  Occupational History  . Not on file  Tobacco Use  . Smoking status: Former Smoker    Packs/day: 0.25    Years: 50.00    Pack years: 12.50    Types: Cigarettes    Quit date: 03/18/2016    Years since quitting: 3.9  . Smokeless tobacco: Never Used  Vaping Use  . Vaping Use: Never used  Substance and  Sexual Activity  . Alcohol use: Yes    Alcohol/week: 0.0 standard drinks    Comment: 4 beers once a week   . Drug use: No  . Sexual activity: Not on file  Other Topics Concern  . Not on file  Social History Narrative  . Not on file   Social Determinants of Health   Financial Resource Strain:   . Difficulty of Paying Living Expenses: Not on file  Food Insecurity:   . Worried About Charity fundraiser in the Last Year: Not on file  . Ran Out of Food in the Last Year: Not on file  Transportation Needs:   . Lack of Transportation (Medical): Not on file  . Lack of Transportation (Non-Medical): Not on file  Physical Activity:   . Days of Exercise per Week: Not on file  . Minutes of Exercise per Session: Not on file  Stress:   . Feeling of Stress : Not on file  Social Connections:   . Frequency of Communication with Friends and Family: Not on file  . Frequency of Social Gatherings with Friends and Family: Not on file  . Attends Religious Services: Not on file  . Active Member of Clubs or Organizations: Not on file  . Attends Archivist Meetings: Not on file  . Marital Status: Not on file  Intimate Partner Violence:   . Fear of Current or Ex-Partner: Not on file  . Emotionally Abused: Not on file  . Physically Abused: Not on file  . Sexually Abused: Not on file    Review of Systems  Constitutional: Negative for fatigue and unexpected weight change.  Eyes: Negative for visual disturbance.  Respiratory: Negative for cough, chest tightness and shortness of breath.   Cardiovascular: Negative for chest pain, palpitations and leg swelling.  Gastrointestinal: Negative for abdominal pain and blood in stool.  Neurological: Negative for dizziness, light-headedness and headaches.     Objective:   Vitals:   02/26/20 1628  BP: (!) 150/79  Pulse: 84  Resp: 16  Temp: 97.9 F (36.6 C)  TempSrc: Temporal  SpO2: 96%  Weight: 237 lb (107.5 kg)  Height: 6' (1.829 m)      Physical Exam Vitals reviewed.  Constitutional:      Appearance: He is well-developed.  HENT:     Head: Normocephalic and atraumatic.  Eyes:     Pupils: Pupils are equal, round, and reactive to light.  Neck:     Vascular: No carotid bruit or JVD.  Cardiovascular:     Rate and Rhythm: Normal rate and regular rhythm.     Heart sounds: Normal heart sounds. No murmur heard.   Pulmonary:  Effort: Pulmonary effort is normal.     Breath sounds: Normal breath sounds. No rales.  Skin:    General: Skin is warm and dry.  Neurological:     General: No focal deficit present.     Mental Status: He is alert and oriented to person, place, and time.  Psychiatric:        Mood and Affect: Mood normal.        Behavior: Behavior normal.     Assessment & Plan:  Stone Spirito is a 74 y.o. male . DM type 2 with diabetic mixed hyperlipidemia ([place]) - Plan: Hemoglobin A1c  -Check A1c.  No change in regimen for now  Essential hypertension - Plan: Comprehensive metabolic panel  -Home monitoring next 2 weeks to decide if med changes.  Possible whitecoat component.  -Plans to follow-up after orthopedic appointment to discuss meloxicam.  Potential cardiac risk discussed, including with his previous history of coronary artery calcification.  Can discuss further at follow-up appointment.  Hyperlipidemia, unspecified hyperlipidemia type - Plan: Lipid panel  -Tolerating Lipitor, continue same.  Labs pending  No orders of the defined types were placed in this encounter.  Patient Instructions    Please follow up after ortho appointment to discuss mobic prescription.   Keep a record of your blood pressures outside of the office in next 2 weeks to decide on med changes.   Thanks for coming in today.   If you have lab work done today you will be contacted with your lab results within the next 2 weeks.  If you have not heard from [place] then please contact us. The fastest way to get your results is to  register for My Chart.   IF you received an x-ray today, you will receive an invoice from [facility]. Please contact [facility] at [phone] with questions or concerns regarding your invoice.   IF you received labwork today, you will receive an invoice from [facility]. Please contact [facility] at [phone] with questions or concerns regarding your invoice.   Our billing staff will not be able to assist you with questions regarding bills from these companies.  You will be contacted with the lab results as soon as they are available. The fastest way to get your results is to activate your My Chart account. Instructions are located on the last page of this paperwork. If you have not heard from [place] regarding the results in 2 weeks, please contact this office.         Signed, Merri Ray, MD [facility]

## 2020-02-27 ENCOUNTER — Encounter: Payer: Self-pay | Admitting: Family Medicine

## 2020-02-28 ENCOUNTER — Ambulatory Visit: Payer: Medicare HMO | Admitting: Family Medicine

## 2020-02-29 LAB — COMPREHENSIVE METABOLIC PANEL
ALT: 20 IU/L (ref 0–44)
AST: 22 IU/L (ref 0–40)
Albumin/Globulin Ratio: 2 (ref 1.2–2.2)
Albumin: 4.3 g/dL (ref 3.7–4.7)
Alkaline Phosphatase: 135 IU/L — ABNORMAL HIGH (ref 48–121)
BUN/Creatinine Ratio: 21 (ref 10–24)
BUN: 15 mg/dL (ref 8–27)
Bilirubin Total: 0.7 mg/dL (ref 0.0–1.2)
CO2: 20 mmol/L (ref 20–29)
Calcium: 9.6 mg/dL (ref 8.6–10.2)
Chloride: 104 mmol/L (ref 96–106)
Creatinine, Ser: 0.7 mg/dL — ABNORMAL LOW (ref 0.76–1.27)
GFR calc Af Amer: 107 mL/min/{1.73_m2} (ref >59)
GFR calc non Af Amer: 93 mL/min/{1.73_m2} (ref >59)
Globulin, Total: 2.2 g/dL (ref 1.5–4.5)
Glucose: 133 mg/dL — ABNORMAL HIGH (ref 65–99)
Potassium: 4.6 mmol/L (ref 3.5–5.2)
Sodium: 139 mmol/L (ref 134–144)
Total Protein: 6.5 g/dL (ref 6.0–8.5)

## 2020-02-29 LAB — LIPID PANEL
Chol/HDL Ratio: 3.4 ratio (ref 0.0–5.0)
Cholesterol, Total: 158 mg/dL (ref 100–199)
HDL: 46 mg/dL (ref >39)
LDL Chol Calc (NIH): 85 mg/dL (ref 0–99)
Triglycerides: 159 mg/dL — ABNORMAL HIGH (ref 0–149)
VLDL Cholesterol Cal: 27 mg/dL (ref 5–40)

## 2020-02-29 LAB — HEMOGLOBIN A1C
Est. average glucose Bld gHb Est-mCnc: 154 mg/dL
Hgb A1c MFr Bld: 7 % — ABNORMAL HIGH (ref 4.8–5.6)

## 2020-03-02 ENCOUNTER — Encounter: Payer: Self-pay | Admitting: Family Medicine

## 2020-03-03 ENCOUNTER — Other Ambulatory Visit: Payer: Self-pay | Admitting: Family Medicine

## 2020-03-03 DIAGNOSIS — M65342 Trigger finger, left ring finger: Secondary | ICD-10-CM | POA: Diagnosis not present

## 2020-03-03 MED ORDER — MELOXICAM 15 MG PO TABS
15.0000 mg | ORAL_TABLET | Freq: Every day | ORAL | 1 refills | Status: DC
Start: 2020-03-03 — End: 2020-03-24

## 2020-03-03 NOTE — Telephone Encounter (Signed)
Rx has been resent to correct pharmacy  

## 2020-03-03 NOTE — Telephone Encounter (Signed)
Pt called stating that he is needing the medication, meloxicam, sent to [facility] instead. Please advise.       [facility] [place], [place] [facility] [address] [place] [address]  Phone: [phone] Fax: [phone]  Hours: Not open 24 hours

## 2020-03-04 ENCOUNTER — Encounter: Payer: Self-pay | Admitting: Family Medicine

## 2020-03-04 ENCOUNTER — Telehealth: Payer: Self-pay | Admitting: Family Medicine

## 2020-03-04 NOTE — Telephone Encounter (Signed)
Pt has canceled  his AWV on 03/17/20 and would like a call to resch this appt. Please advise.

## 2020-03-11 ENCOUNTER — Encounter: Payer: Self-pay | Admitting: Family Medicine

## 2020-03-11 NOTE — Telephone Encounter (Signed)
Patient was just informing you about his blood pressure readings Please Advise.

## 2020-03-17 ENCOUNTER — Ambulatory Visit: Payer: Medicare HMO | Admitting: Registered Nurse

## 2020-03-17 ENCOUNTER — Ambulatory Visit: Payer: Self-pay

## 2020-03-17 VITALS — BP 152/76 | Ht 72.0 in | Wt 237.0 lb

## 2020-03-17 DIAGNOSIS — Z0000 Encounter for general adult medical examination without abnormal findings: Secondary | ICD-10-CM

## 2020-03-17 NOTE — Progress Notes (Signed)
Presents today for [employer] Visit   Date of last exam: 11-25-2019  Interpreter used for this visit? No  I connected with  Christopher Bauer on 03/17/20 by a telephone application and verified that I am speaking with the correct person using two identifiers.   I discussed the limitations of evaluation and management by telemedicine. The patient expressed understanding and agreed to proceed.  Patient location: home  Provider location: in office  I provided 20 minutes of non face - to - face time during this encounter.  Patient Care Team: Wendie Agreste, MD as PCP - General (Family Medicine) Adrian Prows, MD as Consulting Physician (Cardiology) Warden Fillers, MD as Consulting Physician (Ophthalmology)   Other items to address today:  Discussed Eye/Dental Discussed immunizations Follow up for BP with Dr. Carlota Raspberry 9-15 @ 10:40    Other Screening: Last screening for diabetes: 02-26-2020 Last lipid screening:02/26/2020  ADVANCE DIRECTIVES: Discussed: yes On File:no Materials Provided: no  Immunization status:  Immunization History  Administered Date(s) Administered  . Fluad Quad(high Dose 65+) 03/01/2019  . Influenza Split 04/12/2013  . Influenza,inj,Quad PF,6+ Mos 05/25/2016, 02/26/2020  . Influenza-Unspecified 04/10/2014, 04/20/2015, 05/11/2018  . Moderna SARS-COVID-2 Vaccination 08/26/2019, 09/24/2019  . Pneumococcal Conjugate-13 05/01/2014  . Pneumococcal Polysaccharide-23 02/04/2016  . Tdap 02/15/2017     Health Maintenance Due  Topic Date Due  . FOOT EXAM  02/29/2020     Functional Status Survey: Is the patient deaf or have difficulty hearing?: No Does the patient have difficulty seeing, even when wearing glasses/contacts?: No Does the patient have difficulty concentrating, remembering, or making decisions?: No Does the patient have difficulty walking or climbing stairs?: No Does the patient have difficulty dressing or bathing?:  No Does the patient have difficulty doing errands alone such as visiting a doctor's office or shopping?: No   6CIT Screen 03/17/2020 03/15/2019 08/24/2017  What Year? 0 points 0 points 0 points  What month? 0 points 0 points 0 points  What time? 0 points 0 points 0 points  Count back from 20 0 points 0 points 0 points  Months in reverse 2 points 0 points 4 points  Repeat phrase 0 points 0 points 2 points  Total Score 2 0 6        Clinical Support from 03/17/2020 in [facility]  AUDIT-C Score 4       Home Environment:   Works part-time  Lives in a one story No scattered rugs No Grab bars Adequate lighting/ no clutter No trouble climbing stairs   Patient Active Problem List   Diagnosis Date Noted  . Former smoker 08/24/2016  . Coronary artery calcification 02/04/2016  . Abnormal liver function test 10/15/2015  . Abnormal CXR 05/01/2014  . Diabetes mellitus type II 04/13/2012  . High cholesterol 04/13/2012  . Osteoarthritis of both knees 04/13/2012     Past Medical History:  Diagnosis Date  . Arthritis   . Diabetes mellitus without complication ([place])   . Hyperlipidemia   . Hypertension   . Prostatitis      Past Surgical History:  Procedure Laterality Date  . APPENDECTOMY       Family History  Problem Relation Age of Onset  . Heart disease Mother      Social History   Socioeconomic History  . Marital status: Divorced    Spouse name: Not on file  . Number of children: 2  . Years of education: Not on file  . Highest education  level: High school graduate  Occupational History  . Not on file  Tobacco Use  . Smoking status: Former Smoker    Packs/day: 0.25    Years: 50.00    Pack years: 12.50    Types: Cigarettes    Quit date: 03/18/2016    Years since quitting: 4.0  . Smokeless tobacco: Never Used  Vaping Use  . Vaping Use: Never used  Substance and Sexual Activity  . Alcohol use: Yes    Alcohol/week: 0.0 standard drinks    Comment: 4  beers once a week   . Drug use: No  . Sexual activity: Not on file  Other Topics Concern  . Not on file  Social History Narrative  . Not on file   Social Determinants of Health   Financial Resource Strain:   . Difficulty of Paying Living Expenses: Not on file  Food Insecurity:   . Worried About Bauer fundraiser in the Last Year: Not on file  . Ran Out of Food in the Last Year: Not on file  Transportation Needs:   . Lack of Transportation (Medical): Not on file  . Lack of Transportation (Non-Medical): Not on file  Physical Activity:   . Days of Exercise per Week: Not on file  . Minutes of Exercise per Session: Not on file  Stress:   . Feeling of Stress : Not on file  Social Connections:   . Frequency of Communication with Friends and Family: Not on file  . Frequency of Social Gatherings with Friends and Family: Not on file  . Attends Religious Services: Not on file  . Active Member of Clubs or Organizations: Not on file  . Attends Archivist Meetings: Not on file  . Marital Status: Not on file  Intimate Partner Violence:   . Fear of Current or Ex-Partner: Not on file  . Emotionally Abused: Not on file  . Physically Abused: Not on file  . Sexually Abused: Not on file     Allergies  Allergen Reactions  . Sulfa Antibiotics Itching     Prior to Admission medications   Medication Sig Start Date End Date Taking? Authorizing Provider  aspirin 81 MG tablet Take 81 mg by mouth daily.   Yes [provider]  atorvastatin (LIPITOR) 40 MG tablet TAKE 1 TABLET DAILY 02/05/20  Yes Wendie Agreste, MD  carvedilol (COREG) 6.25 MG tablet Take 1 tablet (6.25 mg total) by mouth 2 (two) times daily with a meal. 08/23/19  Yes Wendie Agreste, MD  lisinopril (ZESTRIL) 10 MG tablet TAKE 1 TABLET DAILY 02/05/20  Yes Wendie Agreste, MD  metFORMIN (GLUCOPHAGE) 1000 MG tablet TAKE 1 TABLET TWICE DAILY  WITH MEALS 02/05/20  Yes Wendie Agreste, MD  lisinopril (ZESTRIL)  20 MG tablet TAKE 1 TABLET DAILY 02/05/20   Wendie Agreste, MD  meloxicam (MOBIC) 15 MG tablet Take 1 tablet (15 mg total) by mouth daily. Patient not taking: Reported on 03/17/2020 03/03/20   Wendie Agreste, MD     Depression screen [facility] 2/9 03/17/2020 02/26/2020 11/25/2019 08/23/2019 05/24/2019  Decreased Interest 0 0 0 0 0  Down, Depressed, Hopeless 0 0 0 0 0  PHQ - 2 Score 0 0 0 0 0     Fall Risk  03/17/2020 02/26/2020 11/25/2019 08/23/2019 05/24/2019  Falls in the past year? 0 0 0 0 0  Number falls in past yr: 0 0 0 0 0  Injury with Fall? 0 0 0  0 0  Risk for fall due to : - No Fall Risks - - -  Follow up Falls evaluation completed;Education provided Falls evaluation completed Falls evaluation completed Falls evaluation completed -      PHYSICAL EXAM: BP (!) 152/76 Comment: taken from previous visit  Ht 6' (1.829 m)   Wt 237 lb (107.5 kg)   BMI 32.14 kg/m    Wt Readings from Last 3 Encounters:  03/17/20 237 lb (107.5 kg)  02/26/20 237 lb (107.5 kg)  11/25/19 236 lb 6.4 oz (107.2 kg)       Education/Counseling provided regarding diet and exercise, prevention of chronic diseases, smoking/tobacco cessation, if applicable, and reviewed "Covered Medicare Preventive Services."

## 2020-03-17 NOTE — Patient Instructions (Signed)
Thank you for taking time to come for your Medicare Wellness Visit. I appreciate your ongoing commitment to your health goals. Please review the following plan we discussed and let me know if I can assist you in the future.  Leroy Kennedy LPN  Preventive Care 75 Years and Older, Male Preventive care refers to lifestyle choices and visits with your health care provider that can promote health and wellness. This includes:  A yearly physical exam. This is also called an annual well check.  Regular dental and eye exams.  Immunizations.  Screening for certain conditions.  Healthy lifestyle choices, such as diet and exercise. What can I expect for my preventive care visit? Physical exam Your health care provider will check:  Height and weight. These may be used to calculate body mass index (BMI), which is a measurement that tells if you are at a healthy weight.  Heart rate and blood pressure.  Your skin for abnormal spots. Counseling Your health care provider may ask you questions about:  Alcohol, tobacco, and drug use.  Emotional well-being.  Home and relationship well-being.  Sexual activity.  Eating habits.  History of falls.  Memory and ability to understand (cognition).  Work and work Statistician. What immunizations do I need?  Influenza (flu) vaccine  This is recommended every year. Tetanus, diphtheria, and pertussis (Tdap) vaccine  You may need a Td booster every 10 years. Varicella (chickenpox) vaccine  You may need this vaccine if you have not already been vaccinated. Zoster (shingles) vaccine  You may need this after age 66. Pneumococcal conjugate (PCV13) vaccine  One dose is recommended after age 84. Pneumococcal polysaccharide (PPSV23) vaccine  One dose is recommended after age 88. Measles, mumps, and rubella (MMR) vaccine  You may need at least one dose of MMR if you were born in 1957 or later. You may also need a second dose. Meningococcal  conjugate (MenACWY) vaccine  You may need this if you have certain conditions. Hepatitis A vaccine  You may need this if you have certain conditions or if you travel or work in places where you may be exposed to hepatitis A. Hepatitis B vaccine  You may need this if you have certain conditions or if you travel or work in places where you may be exposed to hepatitis B. Haemophilus influenzae type b (Hib) vaccine  You may need this if you have certain conditions. You may receive vaccines as individual doses or as more than one vaccine together in one shot (combination vaccines). Talk with your health care provider about the risks and benefits of combination vaccines. What tests do I need? Blood tests  Lipid and cholesterol levels. These may be checked every 5 years, or more frequently depending on your overall health.  Hepatitis C test.  Hepatitis B test. Screening  Lung cancer screening. You may have this screening every year starting at age 24 if you have a 30-pack-year history of smoking and currently smoke or have quit within the past 15 years.  Colorectal cancer screening. All adults should have this screening starting at age 52 and continuing until age 61. Your health care provider may recommend screening at age 57 if you are at increased risk. You will have tests every 1-10 years, depending on your results and the type of screening test.  Prostate cancer screening. Recommendations will vary depending on your family history and other risks.  Diabetes screening. This is done by checking your blood sugar (glucose) after you have not eaten for  a while (fasting). You may have this done every 1-3 years.  Abdominal aortic aneurysm (AAA) screening. You may need this if you are a current or former smoker.  Sexually transmitted disease (STD) testing. Follow these instructions at home: Eating and drinking  Eat a diet that includes fresh fruits and vegetables, whole grains, lean  protein, and low-fat dairy products. Limit your intake of foods with high amounts of sugar, saturated fats, and salt.  Take vitamin and mineral supplements as recommended by your health care provider.  Do not drink alcohol if your health care provider tells you not to drink.  If you drink alcohol: ? Limit how much you have to 0-2 drinks a day. ? Be aware of how much alcohol is in your drink. In the [place] one drink equals one 12 oz bottle of beer (355 mL), one 5 oz glass of wine (148 mL), or one 1 oz glass of hard liquor (44 mL). Lifestyle  Take daily care of your teeth and gums.  Stay active. Exercise for at least 30 minutes on 5 or more days each week.  Do not use any products that contain nicotine or tobacco, such as cigarettes, e-cigarettes, and chewing tobacco. If you need help quitting, ask your health care provider.  If you are sexually active, practice safe sex. Use a condom or other form of protection to prevent STIs (sexually transmitted infections).  Talk with your health care provider about taking a low-dose aspirin or statin. What's next?  Visit your health care provider once a year for a well check visit.  Ask your health care provider how often you should have your eyes and teeth checked.  Stay up to date on all vaccines. This information is not intended to replace advice given to you by your health care provider. Make sure you discuss any questions you have with your health care provider. Document Revised: 06/21/2018 Document Reviewed: 06/21/2018 Elsevier Patient Education  2020 [employer]  

## 2020-03-24 ENCOUNTER — Other Ambulatory Visit: Payer: Self-pay

## 2020-03-24 ENCOUNTER — Encounter: Payer: Self-pay | Admitting: Family Medicine

## 2020-03-25 ENCOUNTER — Ambulatory Visit (INDEPENDENT_AMBULATORY_CARE_PROVIDER_SITE_OTHER): Payer: Medicare HMO | Admitting: Family Medicine

## 2020-03-25 ENCOUNTER — Encounter: Payer: Self-pay | Admitting: Family Medicine

## 2020-03-25 ENCOUNTER — Other Ambulatory Visit: Payer: Self-pay

## 2020-03-25 VITALS — BP 117/62 | HR 83 | Temp 98.3°F | Resp 17 | Ht 72.0 in | Wt 237.2 lb

## 2020-03-25 DIAGNOSIS — I10 Essential (primary) hypertension: Secondary | ICD-10-CM

## 2020-03-25 DIAGNOSIS — E1169 Type 2 diabetes mellitus with other specified complication: Secondary | ICD-10-CM

## 2020-03-25 DIAGNOSIS — E782 Mixed hyperlipidemia: Secondary | ICD-10-CM | POA: Diagnosis not present

## 2020-03-25 MED ORDER — LISINOPRIL 40 MG PO TABS: 40.0000 mg | ORAL_TABLET | Freq: Every day | ORAL | 2 refills | Status: DC

## 2020-03-25 NOTE — Progress Notes (Signed)
Subjective:  Patient ID: Christopher Bauer, male    DOB: Mar 01, 1946  Age: 74 y.o. MRN: [id number]  CC:  Chief Complaint  Patient presents with  . Hypertension    pt has been tracking BP at home but concerned it is not accurate as recent reading is not close to our reading.     HPI Christopher Bauer presents for  Follow-up from August 18  Hypertension: He is continued on Coreg 6.25 mg at last visit, lisinopril increased from 30 to 40 mg given elevated readings.no new cough or side effects.   Home readings: 150/80 average since August based on home readings and telephone note yesterday.  Currently on lisinopril 40 mg daily. Will be looking into new meter.   BP Readings from Last 3 Encounters:  03/25/20 117/62  03/17/20 (!) 152/76  02/26/20 (!) 150/79   Lab Results  Component Value Date   CREATININE 0.70 (L) 02/26/2020   Diabetes: With mixed hyperlipidemia Discussed in August, A1c slightly increased from 6.5-7.0. Currently treated with Metformin 1000 mg twice daily.  He is on statin and ACE inhibitor as well as aspirin. We have discussed concerns of long-term anti-inflammatory with  kidney/cardiac risks.  Currently taking Celebrex daily,prior meloxicam.  Saw ortho -they put him on celebrex for arthritis. Knuckles of hands.  Lab Results  Component Value Date   HGBA1C 7.0 (H) 02/26/2020   HGBA1C 6.5 (H) 11/25/2019   HGBA1C 6.8 (H) 08/23/2019   Lab Results  Component Value Date   MICROALBUR 0.3 10/15/2015   LDLCALC 85 02/26/2020   CREATININE 0.70 (L) 02/26/2020   Wt Readings from Last 3 Encounters:  03/25/20 237 lb 3.2 oz (107.6 kg)  03/17/20 237 lb (107.5 kg)  02/26/20 237 lb (107.5 kg)      History Patient Active Problem List   Diagnosis Date Noted  . Former smoker 08/24/2016  . Coronary artery calcification 02/04/2016  . Abnormal liver function test 10/15/2015  . Abnormal CXR 05/01/2014  . Diabetes mellitus type II 04/13/2012  . High cholesterol 04/13/2012  .  Osteoarthritis of both knees 04/13/2012   Past Medical History:  Diagnosis Date  . Arthritis   . Diabetes mellitus without complication ([place])   . Hyperlipidemia   . Hypertension   . Prostatitis    Past Surgical History:  Procedure Laterality Date  . APPENDECTOMY     Allergies  Allergen Reactions  . Sulfa Antibiotics Itching   Prior to Admission medications   Medication Sig Start Date End Date Taking? Authorizing Provider  aspirin 81 MG tablet Take 81 mg by mouth daily.   Yes [provider]  atorvastatin (LIPITOR) 40 MG tablet TAKE 1 TABLET DAILY 02/05/20  Yes Wendie Agreste, MD  carvedilol (COREG) 6.25 MG tablet Take 1 tablet (6.25 mg total) by mouth 2 (two) times daily with a meal. 08/23/19  Yes Wendie Agreste, MD  celecoxib (CELEBREX) 200 MG capsule Take 200 mg by mouth daily.    Yes [provider]  lisinopril (ZESTRIL) 10 MG tablet TAKE 1 TABLET DAILY 02/05/20  Yes Wendie Agreste, MD  lisinopril (ZESTRIL) 20 MG tablet TAKE 1 TABLET DAILY 02/05/20  Yes Wendie Agreste, MD  metFORMIN (GLUCOPHAGE) 1000 MG tablet TAKE 1 TABLET TWICE DAILY  WITH MEALS 02/05/20  Yes Wendie Agreste, MD   Social History   Socioeconomic History  . Marital status: Divorced    Spouse name: Not on file  . Number of children: 2  . Years of education:  Not on file  . Highest education level: High school graduate  Occupational History  . Not on file  Tobacco Use  . Smoking status: Former Smoker    Packs/day: 0.25    Years: 50.00    Pack years: 12.50    Types: Cigarettes    Quit date: 03/18/2016    Years since quitting: 4.0  . Smokeless tobacco: Never Used  Vaping Use  . Vaping Use: Never used  Substance and Sexual Activity  . Alcohol use: Yes    Alcohol/week: 0.0 standard drinks    Comment: 4 beers once a week   . Drug use: No  . Sexual activity: Not on file  Other Topics Concern  . Not on file  Social History Narrative  . Not on file   Social Determinants of  Health   Financial Resource Strain:   . Difficulty of Paying Living Expenses: Not on file  Food Insecurity:   . Worried About Charity fundraiser in the Last Year: Not on file  . Ran Out of Food in the Last Year: Not on file  Transportation Needs:   . Lack of Transportation (Medical): Not on file  . Lack of Transportation (Non-Medical): Not on file  Physical Activity:   . Days of Exercise per Week: Not on file  . Minutes of Exercise per Session: Not on file  Stress:   . Feeling of Stress : Not on file  Social Connections:   . Frequency of Communication with Friends and Family: Not on file  . Frequency of Social Gatherings with Friends and Family: Not on file  . Attends Religious Services: Not on file  . Active Member of Clubs or Organizations: Not on file  . Attends Archivist Meetings: Not on file  . Marital Status: Not on file  Intimate Partner Violence:   . Fear of Current or Ex-Partner: Not on file  . Emotionally Abused: Not on file  . Physically Abused: Not on file  . Sexually Abused: Not on file    Review of Systems  Constitutional: Negative for fatigue and unexpected weight change.  Eyes: Negative for visual disturbance.  Respiratory: Negative for cough, chest tightness and shortness of breath.   Cardiovascular: Negative for chest pain, palpitations and leg swelling.  Gastrointestinal: Negative for abdominal pain and blood in stool.  Neurological: Negative for dizziness, light-headedness and headaches.     Objective:   Vitals:   03/25/20 1042  BP: 117/62  Pulse: 83  Resp: 17  Temp: 98.3 F (36.8 C)  TempSrc: Temporal  SpO2: 96%  Weight: 237 lb 3.2 oz (107.6 kg)  Height: 6' (1.829 m)     Physical Exam Vitals reviewed.  Constitutional:      Appearance: He is well-developed.  HENT:     Head: Normocephalic and atraumatic.  Eyes:     Pupils: Pupils are equal, round, and reactive to light.  Neck:     Vascular: No carotid bruit or JVD.    Cardiovascular:     Rate and Rhythm: Normal rate and regular rhythm.     Heart sounds: Normal heart sounds. No murmur heard.   Pulmonary:     Effort: Pulmonary effort is normal.     Breath sounds: Normal breath sounds. No rales.  Skin:    General: Skin is warm and dry.  Neurological:     Mental Status: He is alert and oriented to person, place, and time.  Psychiatric:  Behavior: Behavior normal.     Assessment & Plan:  Christopher Bauer is a 74 y.o. male . Essential hypertension - Plan: lisinopril (ZESTRIL) 40 MG tablet  -Improved on higher dose of lisinopril, continue same, repeat labs in 3 months.  DM type 2 with diabetic mixed hyperlipidemia ([place])  -No med changes at this time, continue to watch diet, activity.  Recheck levels in 3 months.  Discussed potential risks with long-term NSAID or Cox 2 inhibitor use.  Has tried topical Voltaren with some relief, would recommend topical over systemic meds if possible.  RTC precautions.  64-month follow-up.  Meds ordered this encounter  Medications  . lisinopril (ZESTRIL) 40 MG tablet    Sig: Take 1 tablet (40 mg total) by mouth daily.    Dispense:  90 tablet    Refill:  2   Patient Instructions    celebrex only if needed.  voltaren gel may be a safer option,.If you do require chronic celebrex there are some risks.   Continue metformin same dose, just watch diet for now.   Lisinopril 40mg  for now.   Recheck 3 months  If you have lab work done today you will be contacted with your lab results within the next 2 weeks.  If you have not heard from [place] then please contact us. The fastest way to get your results is to register for My Chart.   IF you received an x-ray today, you will receive an invoice from [facility]. Please contact [facility] at [phone] with questions or concerns regarding your invoice.   IF you received labwork today, you will receive an invoice from [place]. Please contact [facility] at  [phone] with questions or concerns regarding your invoice.   Our billing staff will not be able to assist you with questions regarding bills from these companies.  You will be contacted with the lab results as soon as they are available. The fastest way to get your results is to activate your My Chart account. Instructions are located on the last page of this paperwork. If you have not heard from [place] regarding the results in 2 weeks, please contact this office.         Signed, Merri Ray, MD [facility]

## 2020-03-25 NOTE — Patient Instructions (Addendum)
  celebrex only if needed.  voltaren gel may be a safer option,.If you do require chronic celebrex there are some risks.   Continue metformin same dose, just watch diet for now.   Lisinopril 40mg  for now.   Recheck 3 months  If you have lab work done today you will be contacted with your lab results within the next 2 weeks.  If you have not heard from [place] then please contact us. The fastest way to get your results is to register for My Chart.   IF you received an x-ray today, you will receive an invoice from [facility]. Please contact [facility] at [phone] with questions or concerns regarding your invoice.   IF you received labwork today, you will receive an invoice from [place]. Please contact [facility] at [phone] with questions or concerns regarding your invoice.   Our billing staff will not be able to assist you with questions regarding bills from these companies.  You will be contacted with the lab results as soon as they are available. The fastest way to get your results is to activate your My Chart account. Instructions are located on the last page of this paperwork. If you have not heard from [place] regarding the results in 2 weeks, please contact this office.

## 2020-03-30 ENCOUNTER — Encounter: Payer: Self-pay | Admitting: Family Medicine

## 2020-05-12 ENCOUNTER — Encounter: Payer: Self-pay | Admitting: Family Medicine

## 2020-05-21 ENCOUNTER — Other Ambulatory Visit: Payer: Self-pay | Admitting: Family Medicine

## 2020-05-21 DIAGNOSIS — I10 Essential (primary) hypertension: Secondary | ICD-10-CM

## 2020-06-01 ENCOUNTER — Telehealth: Payer: Self-pay | Admitting: Family Medicine

## 2020-06-01 NOTE — Telephone Encounter (Signed)
06/01/2020 - PATIENT HAS A 6 MONTH FOLLOW-UP WITH DR. Carlota Raspberry ON Wednesday (07/08/2020) AT 8:00am. DR. Carlota Raspberry WILL BE OUT OF THE OFFICE. I TRIED TO CALL AND RESCHEDULE BUT HAD TO LEAVE A MESSAGE ON HIS VOICE MAIL TO RETURN MY CALL. [place]

## 2020-07-08 ENCOUNTER — Ambulatory Visit: Payer: Medicare HMO | Admitting: Family Medicine

## 2020-07-09 ENCOUNTER — Other Ambulatory Visit: Payer: Self-pay | Admitting: Family Medicine

## 2020-07-09 DIAGNOSIS — I10 Essential (primary) hypertension: Secondary | ICD-10-CM

## 2020-07-09 MED ORDER — CARVEDILOL 6.25 MG PO TABS: 6.2500 mg | ORAL_TABLET | Freq: Two times a day (BID) | ORAL | 0 refills | Status: DC

## 2020-07-13 ENCOUNTER — Other Ambulatory Visit: Payer: Self-pay

## 2020-07-13 ENCOUNTER — Telehealth (INDEPENDENT_AMBULATORY_CARE_PROVIDER_SITE_OTHER): Payer: Medicare HMO | Admitting: Family Medicine

## 2020-07-13 ENCOUNTER — Encounter: Payer: Self-pay | Admitting: Family Medicine

## 2020-07-13 VITALS — BP 142/78 | HR 84 | Temp 98.8°F | Resp 16 | Ht 72.0 in | Wt 239.0 lb

## 2020-07-13 DIAGNOSIS — I10 Essential (primary) hypertension: Secondary | ICD-10-CM | POA: Diagnosis not present

## 2020-07-13 DIAGNOSIS — E782 Mixed hyperlipidemia: Secondary | ICD-10-CM | POA: Diagnosis not present

## 2020-07-13 DIAGNOSIS — R350 Frequency of micturition: Secondary | ICD-10-CM | POA: Diagnosis not present

## 2020-07-13 DIAGNOSIS — R059 Cough, unspecified: Secondary | ICD-10-CM | POA: Diagnosis not present

## 2020-07-13 DIAGNOSIS — E1169 Type 2 diabetes mellitus with other specified complication: Secondary | ICD-10-CM

## 2020-07-13 LAB — POCT URINALYSIS DIP (MANUAL ENTRY)
Bilirubin, UA: NEGATIVE
Glucose, UA: NEGATIVE mg/dL
Ketones, POC UA: NEGATIVE mg/dL
Leukocytes, UA: NEGATIVE
Nitrite, UA: NEGATIVE
Protein Ur, POC: NEGATIVE mg/dL
Spec Grav, UA: 1.02 (ref 1.010–1.025)
Urobilinogen, UA: 0.2 E.U./dL
pH, UA: 5 (ref 5.0–8.0)

## 2020-07-13 MED ORDER — METFORMIN HCL 1000 MG PO TABS
1000.0000 mg | ORAL_TABLET | Freq: Two times a day (BID) | ORAL | 1 refills | Status: DC
Start: 2020-07-13 — End: 2020-09-09

## 2020-07-13 NOTE — Progress Notes (Signed)
Subjective:  Patient ID: Christopher Bauer, male    DOB: 01-31-1946  Age: 75 y.o. MRN: [id number]  CC:  Chief Complaint  Patient presents with  . Follow-up    On diabetes. Pt reports no issues that he has noticed with this condition since last OV.  Mar[place] Kitchen Cough    Pt reports a dry cough at night. Pt reports feeling some drainage, but no congestion. No known exposure to a positive covid-19 case. Pt reports these symptoms started about a week ago.    HPI Avyay Bauer presents for   Diabetes: With mixed hyperlipidemia Metformin 1000 mg twice daily.  He is on statin and ACE inhibitor. Long-term use of anti-inflammatories with kidney and cardiac risks have been discussed previously.  Treated with Celebrex in past for arthritis in hands, followed by orthopedics. Now on mobic.  Home readings - none.  Some frequent urination past few months.no dysuria/pain/hematuria/fever.  Prostatitis in 2020.  Microalbumin: nl ratio in 05/2019.   Lab Results  Component Value Date   HGBA1C 7.0 (H) 02/26/2020   HGBA1C 6.5 (H) 11/25/2019   HGBA1C 6.8 (H) 08/23/2019   Lab Results  Component Value Date   MICROALBUR 0.3 10/15/2015   LDLCALC 85 02/26/2020   CREATININE 0.70 (L) 02/26/2020   Hypertension: Elevated readings on his home monitor in September, in office reading of 117/62 at that time.  Treated with lisinopril 40 mg daily, carvedilol 6.25 mg twice daily. He did purchase a new blood pressure meter, MyChart message in September reading 145/78. No new side effects, CP, or dizziness.  Home readings:around [id number] unknown diastolic.  BP Readings from Last 3 Encounters:  07/13/20 (!) 142/78  03/25/20 117/62  03/17/20 (!) 152/76   Lab Results  Component Value Date   CREATININE 0.70 (L) 02/26/2020   Cough: Past 10 days, sporadic, pnd feeling, more when lying down. Dry cough.  He has been vaccinated against Covid with Moderna  vaccine February 15, March 16, booster with [place] on October 26th. No  fever/congestion, body aches, headache, n/v/d. No contact with covid known.  Lives with significant other - she feels fine.  He is on ACE inhibitor.  Rare allergies, has used flonase on occasion. Some improvement with current cough.  No CP, no leg swelling.   History Patient Active Problem List   Diagnosis Date Noted  . Former smoker 08/24/2016  . Coronary artery calcification 02/04/2016  . Abnormal liver function test 10/15/2015  . Abnormal CXR 05/01/2014  . Diabetes mellitus type II 04/13/2012  . High cholesterol 04/13/2012  . Osteoarthritis of both knees 04/13/2012   Past Medical History:  Diagnosis Date  . Arthritis   . Diabetes mellitus without complication ([place])   . Hyperlipidemia   . Hypertension   . Prostatitis    Past Surgical History:  Procedure Laterality Date  . APPENDECTOMY     Allergies  Allergen Reactions  . Sulfa Antibiotics Itching   Prior to Admission medications   Medication Sig Start Date End Date Taking? Authorizing Provider  aspirin 81 MG tablet Take 81 mg by mouth daily.   Yes [provider]  atorvastatin (LIPITOR) 40 MG tablet TAKE 1 TABLET DAILY 02/05/20  Yes Wendie Agreste, MD  carvedilol (COREG) 6.25 MG tablet Take 1 tablet (6.25 mg total) by mouth 2 (two) times daily with a meal. 07/09/20  Yes Wendie Agreste, MD  lisinopril (ZESTRIL) 40 MG tablet Take 1 tablet (40 mg total) by mouth daily. 03/25/20  Yes Wendie Agreste,  MD  metFORMIN (GLUCOPHAGE) 1000 MG tablet TAKE 1 TABLET TWICE DAILY  WITH MEALS 02/05/20  Yes Shade Flood, MD   Social History   Socioeconomic History  . Marital status: Divorced    Spouse name: Not on file  . Number of children: 2  . Years of education: Not on file  . Highest education level: High school graduate  Occupational History  . Not on file  Tobacco Use  . Smoking status: Former Smoker    Packs/day: 0.25    Years: 50.00    Pack years: 12.50    Types: Cigarettes    Quit date: 03/18/2016     Years since quitting: 4.3  . Smokeless tobacco: Never Used  Vaping Use  . Vaping Use: Never used  Substance and Sexual Activity  . Alcohol use: Yes    Alcohol/week: 0.0 standard drinks    Comment: 4 beers once a week   . Drug use: No  . Sexual activity: Not on file  Other Topics Concern  . Not on file  Social History Narrative  . Not on file   Social Determinants of Health   Financial Resource Strain: Not on file  Food Insecurity: Not on file  Transportation Needs: Not on file  Physical Activity: Not on file  Stress: Not on file  Social Connections: Not on file  Intimate Partner Violence: Not on file    Review of Systems  Constitutional: Negative for fatigue and unexpected weight change.  Eyes: Negative for visual disturbance.  Respiratory: Positive for cough. Negative for chest tightness and shortness of breath.   Cardiovascular: Negative for chest pain, palpitations and leg swelling.  Gastrointestinal: Negative for abdominal pain and blood in stool.  Genitourinary: Positive for frequency. Negative for difficulty urinating, dysuria, flank pain, hematuria and urgency.  Neurological: Negative for dizziness, light-headedness and headaches.     Objective:   Vitals:   07/13/20 1054 07/13/20 1138  BP: (!) 169/81 (!) 142/78  Pulse: 84   Resp: 16   Temp: 98.8 F (37.1 C)   TempSrc: Temporal   SpO2: 97%   Weight: 239 lb (108.4 kg)   Height: 6' (1.829 m)      Physical Exam Vitals reviewed.  Constitutional:      Appearance: He is well-developed and well-nourished.  HENT:     Head: Normocephalic and atraumatic.     Right Ear: Tympanic membrane and ear canal normal.     Left Ear: Tympanic membrane and ear canal normal.  Eyes:     Extraocular Movements: EOM normal.     Pupils: Pupils are equal, round, and reactive to light.  Neck:     Vascular: No carotid bruit or JVD.  Cardiovascular:     Rate and Rhythm: Normal rate and regular rhythm.     Heart sounds:  Normal heart sounds. No murmur heard.   Pulmonary:     Effort: Pulmonary effort is normal. No respiratory distress.     Breath sounds: Normal breath sounds. No stridor. No wheezing, rhonchi or rales.  Abdominal:     Tenderness: There is no abdominal tenderness.  Musculoskeletal:        General: No edema.     Right lower leg: No edema.     Left lower leg: No edema.  Skin:    General: Skin is warm and dry.  Neurological:     Mental Status: He is alert and oriented to person, place, and time.  Psychiatric:  Mood and Affect: Mood and affect and mood normal.        Behavior: Behavior normal.        Assessment & Plan:  Gowtham Buggy is a 75 y.o. male . Cough  -Approximately 10 days of symptoms, dry, irritating cough.  Possible allergic rhinitis.  Unlikely COVID-19 without other symptoms, and based on current timing testing was deferred.  RTC precautions given, but can try Flonase for now.  Less likely ACE inhibitor cough but if symptoms not improving the next few weeks, follow-up to look into other causes.  DM type 2 with diabetic mixed hyperlipidemia ([place]) - Plan: metFORMIN (GLUCOPHAGE) 1000 MG tablet, Hemoglobin A1c  -Continue Metformin same dose for now, check A1c.  Urinary frequency - Plan: POCT urinalysis dipstick, POCT Microscopic Urinalysis ([facility]), PSA  -Check A1c, PSA, urinalysis with prior prostatitis but no other symptoms other than frequency at this time.  RTC precautions.  Essential hypertension  Borderline, continue same dose Coreg, lisinopril for now.  If elevated home readings option of doubling to total dose of 12.5 mg twice daily of Coreg.  Advised to let me know if he makes this change.  Meds ordered this encounter  Medications  . metFORMIN (GLUCOPHAGE) 1000 MG tablet    Sig: Take 1 tablet (1,000 mg total) by mouth 2 (two) times daily with a meal.    Dispense:  180 tablet    Refill:  1   Patient Instructions    Okay to continue Flonase nasal spray  daily for the current cough.  If that does not continue to improve over the next week or 2, let me know and we can look at other causes including possibly your blood pressure medicine but that is less likely at this time.   If blood pressure remains over 140/90 at home, I recommend trying 2 of carvedilol twice per day. Let me know if you make that change, and I can send in a new prescription. Watch for side effects on higher dose if you make that change.   I will check some labs for the frequent urination including your blood sugar, prostate test, urine test.  If those symptoms are worsening, or new symptoms please return to be seen and discuss further.  No change in other medications for now.  Thank you for coming in today.    If you have lab work done today you will be contacted with your lab results within the next 2 weeks.  If you have not heard from [place] then please contact us. The fastest way to get your results is to register for My Chart.   IF you received an x-ray today, you will receive an invoice from [facility]. Please contact [facility] Radiology at [phone] with questions or concerns regarding your invoice.   IF you received labwork today, you will receive an invoice from [place]. Please contact [facility] at [phone] with questions or concerns regarding your invoice.   Our billing staff will not be able to assist you with questions regarding bills from these companies.  You will be contacted with the lab results as soon as they are available. The fastest way to get your results is to activate your My Chart account. Instructions are located on the last page of this paperwork. If you have not heard from [place] regarding the results in 2 weeks, please contact this office.         Signed, Merri Ray, MD [facility]

## 2020-07-13 NOTE — Patient Instructions (Addendum)
  Okay to continue Flonase nasal spray daily for the current cough.  If that does not continue to improve over the next week or 2, let me know and we can look at other causes including possibly your blood pressure medicine but that is less likely at this time.   If blood pressure remains over 140/90 at home, I recommend trying 2 of carvedilol twice per day. Let me know if you make that change, and I can send in a new prescription. Watch for side effects on higher dose if you make that change.   I will check some labs for the frequent urination including your blood sugar, prostate test, urine test.  If those symptoms are worsening, or new symptoms please return to be seen and discuss further.  No change in other medications for now.  Thank you for coming in today.    If you have lab work done today you will be contacted with your lab results within the next 2 weeks.  If you have not heard from [place] then please contact us. The fastest way to get your results is to register for My Chart.   IF you received an x-ray today, you will receive an invoice from [facility]. Please contact [facility] Radiology at [phone] with questions or concerns regarding your invoice.   IF you received labwork today, you will receive an invoice from [place]. Please contact [facility] at [phone] with questions or concerns regarding your invoice.   Our billing staff will not be able to assist you with questions regarding bills from these companies.  You will be contacted with the lab results as soon as they are available. The fastest way to get your results is to activate your My Chart account. Instructions are located on the last page of this paperwork. If you have not heard from [place] regarding the results in 2 weeks, please contact this office.

## 2020-07-14 LAB — PSA: Prostate Specific Ag, Serum: 1.2 ng/mL (ref 0.0–4.0)

## 2020-07-14 LAB — HEMOGLOBIN A1C
Est. average glucose Bld gHb Est-mCnc: 160 mg/dL
Hgb A1c MFr Bld: 7.2 % — ABNORMAL HIGH (ref 4.8–5.6)

## 2020-09-09 ENCOUNTER — Other Ambulatory Visit: Payer: Self-pay | Admitting: Emergency Medicine

## 2020-09-09 DIAGNOSIS — I10 Essential (primary) hypertension: Secondary | ICD-10-CM

## 2020-09-09 DIAGNOSIS — E1169 Type 2 diabetes mellitus with other specified complication: Secondary | ICD-10-CM

## 2020-09-09 MED ORDER — CARVEDILOL 6.25 MG PO TABS: 6.2500 mg | ORAL_TABLET | Freq: Two times a day (BID) | ORAL | 0 refills | Status: DC

## 2020-09-09 MED ORDER — METFORMIN HCL 1000 MG PO TABS: 1000.0000 mg | ORAL_TABLET | Freq: Two times a day (BID) | ORAL | 1 refills | Status: DC

## 2020-09-09 MED ORDER — LISINOPRIL 40 MG PO TABS: 40.0000 mg | ORAL_TABLET | Freq: Every day | ORAL | 2 refills | Status: DC

## 2020-09-11 ENCOUNTER — Other Ambulatory Visit: Payer: Self-pay | Admitting: Family Medicine

## 2020-09-11 DIAGNOSIS — I10 Essential (primary) hypertension: Secondary | ICD-10-CM

## 2020-09-11 DIAGNOSIS — I2510 Atherosclerotic heart disease of native coronary artery without angina pectoris: Secondary | ICD-10-CM

## 2020-10-12 ENCOUNTER — Ambulatory Visit: Payer: Medicare HMO | Admitting: Family Medicine

## 2020-11-04 ENCOUNTER — Other Ambulatory Visit: Payer: Self-pay | Admitting: Family Medicine

## 2020-11-04 DIAGNOSIS — I10 Essential (primary) hypertension: Secondary | ICD-10-CM

## 2020-11-06 ENCOUNTER — Other Ambulatory Visit: Payer: Self-pay

## 2020-11-06 MED ORDER — CARVEDILOL 6.25 MG PO TABS: 6.2500 mg | ORAL_TABLET | Freq: Two times a day (BID) | ORAL | 0 refills | Status: DC

## 2020-11-06 NOTE — Telephone Encounter (Signed)
Hi these request are routed to Pcp pool. This patient is requesting Carvedilol 6.25 mg. Thanks.

## 2020-11-12 ENCOUNTER — Other Ambulatory Visit: Payer: Self-pay

## 2020-11-12 ENCOUNTER — Ambulatory Visit (INDEPENDENT_AMBULATORY_CARE_PROVIDER_SITE_OTHER): Payer: Medicare HMO | Admitting: Family Medicine

## 2020-11-12 ENCOUNTER — Encounter: Payer: Self-pay | Admitting: Family Medicine

## 2020-11-12 ENCOUNTER — Other Ambulatory Visit (INDEPENDENT_AMBULATORY_CARE_PROVIDER_SITE_OTHER): Payer: Medicare HMO

## 2020-11-12 VITALS — BP 134/76 | HR 73 | Temp 98.4°F | Resp 17 | Ht 72.0 in | Wt 240.4 lb

## 2020-11-12 DIAGNOSIS — E785 Hyperlipidemia, unspecified: Secondary | ICD-10-CM

## 2020-11-12 DIAGNOSIS — M79642 Pain in left hand: Secondary | ICD-10-CM | POA: Diagnosis not present

## 2020-11-12 DIAGNOSIS — S60512A Abrasion of left hand, initial encounter: Secondary | ICD-10-CM | POA: Diagnosis not present

## 2020-11-12 DIAGNOSIS — E782 Mixed hyperlipidemia: Secondary | ICD-10-CM

## 2020-11-12 DIAGNOSIS — L299 Pruritus, unspecified: Secondary | ICD-10-CM | POA: Diagnosis not present

## 2020-11-12 DIAGNOSIS — E1169 Type 2 diabetes mellitus with other specified complication: Secondary | ICD-10-CM

## 2020-11-12 DIAGNOSIS — I10 Essential (primary) hypertension: Secondary | ICD-10-CM

## 2020-11-12 DIAGNOSIS — I2510 Atherosclerotic heart disease of native coronary artery without angina pectoris: Secondary | ICD-10-CM | POA: Diagnosis not present

## 2020-11-12 LAB — LIPID PANEL
Cholesterol: 154 mg/dL (ref 0–200)
HDL: 38 mg/dL — ABNORMAL LOW (ref >39.00)
LDL Cholesterol: 88 mg/dL (ref 0–99)
NonHDL: 115.85
Total CHOL/HDL Ratio: 4
Triglycerides: 137 mg/dL (ref 0.0–149.0)
VLDL: 27.4 mg/dL (ref 0.0–40.0)

## 2020-11-12 LAB — COMPREHENSIVE METABOLIC PANEL
ALT: 21 U/L (ref 0–53)
AST: 21 U/L (ref 0–37)
Albumin: 3.9 g/dL (ref 3.5–5.2)
Alkaline Phosphatase: 109 U/L (ref 39–117)
BUN: 14 mg/dL (ref 6–23)
CO2: 24 mEq/L (ref 19–32)
Calcium: 9.3 mg/dL (ref 8.4–10.5)
Chloride: 103 mEq/L (ref 96–112)
Creatinine, Ser: 0.67 mg/dL (ref 0.40–1.50)
GFR: 91.24 mL/min (ref >60.00)
Glucose, Bld: 139 mg/dL — ABNORMAL HIGH (ref 70–99)
Potassium: 4.6 mEq/L (ref 3.5–5.1)
Sodium: 138 mEq/L (ref 135–145)
Total Bilirubin: 0.8 mg/dL (ref 0.2–1.2)
Total Protein: 6.5 g/dL (ref 6.0–8.3)

## 2020-11-12 LAB — MICROALBUMIN / CREATININE URINE RATIO
Creatinine,U: 155.4 mg/dL
Microalb Creat Ratio: 6.2 mg/g (ref 0.0–30.0)
Microalb, Ur: 9.6 mg/dL — ABNORMAL HIGH (ref 0.0–1.9)

## 2020-11-12 LAB — HEMOGLOBIN A1C: Hgb A1c MFr Bld: 7.3 % — ABNORMAL HIGH (ref 4.6–6.5)

## 2020-11-12 MED ORDER — TRIAMCINOLONE ACETONIDE 0.1 % EX CREA: 1.0000 "application " | TOPICAL_CREAM | Freq: Two times a day (BID) | CUTANEOUS | 0 refills | Status: DC

## 2020-11-12 MED ORDER — CARVEDILOL 6.25 MG PO TABS: 6.2500 mg | ORAL_TABLET | Freq: Two times a day (BID) | ORAL | 0 refills | Status: DC

## 2020-11-12 MED ORDER — ATORVASTATIN CALCIUM 40 MG PO TABS: 1.0000 | ORAL_TABLET | Freq: Every day | ORAL | 2 refills | Status: DC

## 2020-11-12 NOTE — Addendum Note (Signed)
Addended by: Patrcia Dolly on: 11/12/2020 10:09 AM   Modules accepted: Orders

## 2020-11-12 NOTE — Addendum Note (Signed)
Addended by: Lerry Liner on: 11/12/2020 09:58 AM   Modules accepted: Orders

## 2020-11-12 NOTE — Patient Instructions (Signed)
Please follow-up with your hand specialist to discuss other treatments for the hand pain, okay to continue Celebrex for the next week or so, but would recommend against long-term use due to potential risks with that medication as we discussed.  Okay to use Tylenol over-the-counter but no additional anti-inflammatories if you are taking Celebrex.  Try applying Eucerin or Aveeno lotion to the back of the hand to help with dry skin or itching.  I have also prescribed steroid cream to be used up to twice per day for those areas.  No medication changes today.  Follow-up in 3 months.  Can bring blood pressure meter to next visit to check to see how accurate that is reading but the numbers today look okay.  Thank you for coming in today.  Return to the clinic or go to the nearest emergency room if any of your symptoms worsen or new symptoms occur.

## 2020-11-12 NOTE — Progress Notes (Signed)
Subjective:  Patient ID: Christopher Bauer, male    DOB: 10-22-1945  Age: 75 y.o. MRN: [id number]  CC:  Chief Complaint  Patient presents with  . Diabetes    Pt here today to f/u on DM was advised last ov dietary changes and continue meds, notes he has not made many changes and he has not lost any weight as of today. Pt is fasting today for lab work as requested last office visit, though pt notes he had apx 4 sips of his normal morning ocffee with some cream and sugar   . Hypertension    Pt has been doing okay, notes he needs to bring his monitor to have this checked as he is concerned home meter is incorrect   . Arthritis    Pt would like to ask if Advil dual action 250mg  acetaminophen and Ibuprofen 125 mg is okay to use or if he would remain on plain ibuprofen     HPI Christopher Bauer presents for   Diabetes: With hyperlipidemia.  A1c 7.2 in January, plan on diet changes. No changes in diet/exercise.  Metformin 1000 mg twice daily.  He is on statin and ACE inhibitor. No new side effects with meds.  Home readings - none.  Microalbumin: Normal ratio 05/24/2019 Optho, foot exam, pneumovax: Up to date  Lab Results  Component Value Date   HGBA1C 7.2 (H) 07/13/2020   HGBA1C 7.0 (H) 02/26/2020   HGBA1C 6.5 (H) 11/25/2019   Lab Results  Component Value Date   MICROALBUR 0.3 10/15/2015   LDLCALC 85 02/26/2020   CREATININE 0.70 (L) 02/26/2020   Hypertension: Lisinopril 40 mg daily, carvedilol 6.25 mg twice daily. Home readings: under 140/70's.  BP Readings from Last 3 Encounters:  11/12/20 134/76  07/13/20 (!) 142/78  03/25/20 117/62   Lab Results  Component Value Date   CREATININE 0.70 (L) 02/26/2020   Arthritis/hand swelling: Left  hand swelling, soreness past 1 month. Hx of trigger finger with prior injections, does not want to go back to hand specialist - injections help but symptoms come back. Has appt with ortho in next month.  Bothers him when trying to grip typically.  Has  had swelling on occasion in past, seems to be worse this time.  No hx of gout.  Has some celebrex at home - taking past week.  Tx: acetaminophen 250mg  with ibuprofen 125mg  combo - BID. No significant relief.  Itches at hands at night. Not using lotion. (hand dermatitis discussed in past, does not like to use lotion as hands may slip on wheel).    History Patient Active Problem List   Diagnosis Date Noted  . Former smoker 08/24/2016  . Coronary artery calcification 02/04/2016  . Abnormal liver function test 10/15/2015  . Abnormal CXR 05/01/2014  . Diabetes mellitus type II 04/13/2012  . High cholesterol 04/13/2012  . Osteoarthritis of both knees 04/13/2012   Past Medical History:  Diagnosis Date  . Arthritis   . Diabetes mellitus without complication ([place])   . Hyperlipidemia   . Hypertension   . Prostatitis    Past Surgical History:  Procedure Laterality Date  . APPENDECTOMY     Allergies  Allergen Reactions  . Sulfa Antibiotics Itching   Prior to Admission medications   Medication Sig Start Date End Date Taking? Authorizing Provider  aspirin 81 MG tablet Take 81 mg by mouth daily.    [provider]  atorvastatin (LIPITOR) 40 MG tablet TAKE 1 TABLET DAILY 02/05/20  Wendie Agreste, MD  carvedilol (COREG) 6.25 MG tablet Take 1 tablet (6.25 mg total) by mouth 2 (two) times daily with a meal. 11/06/20   Wendie Agreste, MD  lisinopril (ZESTRIL) 40 MG tablet Take 1 tablet (40 mg total) by mouth daily. 09/09/20   Wendie Agreste, MD  metFORMIN (GLUCOPHAGE) 1000 MG tablet Take 1 tablet (1,000 mg total) by mouth 2 (two) times daily with a meal. 09/09/20   Wendie Agreste, MD   Social History   Socioeconomic History  . Marital status: Divorced    Spouse name: Not on file  . Number of children: 2  . Years of education: Not on file  . Highest education level: High school graduate  Occupational History  . Not on file  Tobacco Use  . Smoking status: Former Smoker     Packs/day: 0.25    Years: 50.00    Pack years: 12.50    Types: Cigarettes    Quit date: 03/18/2016    Years since quitting: 4.6  . Smokeless tobacco: Never Used  Vaping Use  . Vaping Use: Never used  Substance and Sexual Activity  . Alcohol use: Yes    Alcohol/week: 0.0 standard drinks    Comment: 4 beers once a week   . Drug use: No  . Sexual activity: Not on file  Other Topics Concern  . Not on file  Social History Narrative  . Not on file   Social Determinants of Health   Financial Resource Strain: Not on file  Food Insecurity: Not on file  Transportation Needs: Not on file  Physical Activity: Not on file  Stress: Not on file  Social Connections: Not on file  Intimate Partner Violence: Not on file    Review of Systems  Constitutional: Negative for fatigue and unexpected weight change.  Eyes: Negative for visual disturbance.  Respiratory: Negative for cough, chest tightness and shortness of breath.   Cardiovascular: Negative for chest pain, palpitations and leg swelling.  Gastrointestinal: Negative for abdominal pain and blood in stool.  Neurological: Negative for dizziness, light-headedness and headaches.     Objective:   Vitals:   11/12/20 0751  BP: 134/76  Pulse: 73  Resp: 17  Temp: 98.4 F (36.9 C)  TempSrc: Temporal  SpO2: 97%  Weight: 240 lb 6.4 oz (109 kg)  Height: 6' (1.829 m)     Physical Exam Vitals reviewed.  Constitutional:      Appearance: He is well-developed.  HENT:     Head: Normocephalic and atraumatic.  Eyes:     Pupils: Pupils are equal, round, and reactive to light.  Neck:     Vascular: No carotid bruit or JVD.  Cardiovascular:     Rate and Rhythm: Normal rate and regular rhythm.     Heart sounds: Normal heart sounds. No murmur heard.   Pulmonary:     Effort: Pulmonary effort is normal.     Breath sounds: Normal breath sounds. No rales.  Musculoskeletal:     Comments: Left hand with multiple superficial abrasions on  dorsum, no significant surrounding erythema, discharge or focal swelling.  Decreased range of motion, incomplete grip on left versus right, reports this has been longstanding.  Reports discomfort at his PIPs and volar MCPs diffusely.  Skin:    General: Skin is warm and dry.  Neurological:     Mental Status: He is alert and oriented to person, place, and time.        35 minutes spent  during visit, greater than 50% counseling and assimilation of information, chart review, and discussion of plan.   Assessment & Plan:  Christopher Bauer is a 75 y.o. male . DM type 2 with diabetic mixed hyperlipidemia ([place]) - Plan: COMPLETE METABOLIC PANEL WITH GFR, Hemoglobin A1c, Microalbumin / creatinine urine ratio  -Check labs, continue metformin.  Diet/exercise changes have been discussed, no significant changes recently.  Essential hypertension - Plan: COMPLETE METABOLIC PANEL WITH GFR, carvedilol (COREG) 6.25 MG tablet  -= Stable, continue same regimen, check labs  Coronary artery disease involving native heart without angina pectoris, unspecified vessel or lesion type - Plan: atorvastatin (LIPITOR) 40 MG tablet Hyperlipidemia, unspecified hyperlipidemia type - Plan: Lipid panel  -Tolerating statin, continue same, check lipid panel.  Denies chest pains or other new cardiac symptoms.  Hand pain, left  -Suspected osteoarthritis along with trigger finger, recurrent.  Recommend he discuss treatment options with his hand specialist.  Currently on Celebrex, would not recommend any additional medication, and we did discuss risks of anti-inflammatories including Celebrex with underlying CAD.  Shortest course possible.  Abrasion of left hand, initial encounter - Plan: triamcinolone cream (KENALOG) 0.1 % Pruritus - Plan: triamcinolone cream (KENALOG) 0.1 %  -Suspect dry skin dermatitis in part.  Has not tried using lotion as previously discussed.  Discussed over-the-counter lotion to the dorsum of his hand to lessen  risk of grip issues.  Triamcinolone up to twice per day as needed for pruritic areas with RTC precautions.  Meds ordered this encounter  Medications  . triamcinolone cream (KENALOG) 0.1 %    Sig: Apply 1 application topically 2 (two) times daily. To hands as needed.    Dispense:  30 g    Refill:  0  . atorvastatin (LIPITOR) 40 MG tablet    Sig: Take 1 tablet (40 mg total) by mouth daily.    Dispense:  90 tablet    Refill:  2  . carvedilol (COREG) 6.25 MG tablet    Sig: Take 1 tablet (6.25 mg total) by mouth 2 (two) times daily with a meal.    Dispense:  180 tablet    Refill:  0   Patient Instructions  Please follow-up with your hand specialist to discuss other treatments for the hand pain, okay to continue Celebrex for the next week or so, but would recommend against long-term use due to potential risks with that medication as we discussed.  Okay to use Tylenol over-the-counter but no additional anti-inflammatories if you are taking Celebrex.  Try applying Eucerin or Aveeno lotion to the back of the hand to help with dry skin or itching.  I have also prescribed steroid cream to be used up to twice per day for those areas.  No medication changes today.  Follow-up in 3 months.  Can bring blood pressure meter to next visit to check to see how accurate that is reading but the numbers today look okay.  Thank you for coming in today.  Return to the clinic or go to the nearest emergency room if any of your symptoms worsen or new symptoms occur.      Signed, Merri Ray, MD [facility]

## 2020-11-17 DIAGNOSIS — M65342 Trigger finger, left ring finger: Secondary | ICD-10-CM | POA: Diagnosis not present

## 2021-02-19 ENCOUNTER — Ambulatory Visit (INDEPENDENT_AMBULATORY_CARE_PROVIDER_SITE_OTHER): Payer: Medicare HMO | Admitting: Family Medicine

## 2021-02-19 ENCOUNTER — Other Ambulatory Visit: Payer: Self-pay

## 2021-02-19 VITALS — BP 138/76 | HR 78 | Temp 98.3°F | Resp 17 | Ht 72.0 in | Wt 239.0 lb

## 2021-02-19 DIAGNOSIS — I10 Essential (primary) hypertension: Secondary | ICD-10-CM | POA: Diagnosis not present

## 2021-02-19 DIAGNOSIS — R195 Other fecal abnormalities: Secondary | ICD-10-CM

## 2021-02-19 DIAGNOSIS — E1169 Type 2 diabetes mellitus with other specified complication: Secondary | ICD-10-CM

## 2021-02-19 DIAGNOSIS — L03031 Cellulitis of right toe: Secondary | ICD-10-CM

## 2021-02-19 DIAGNOSIS — E782 Mixed hyperlipidemia: Secondary | ICD-10-CM | POA: Diagnosis not present

## 2021-02-19 LAB — COMPREHENSIVE METABOLIC PANEL
ALT: 31 U/L (ref 0–53)
AST: 26 U/L (ref 0–37)
Albumin: 4.1 g/dL (ref 3.5–5.2)
Alkaline Phosphatase: 105 U/L (ref 39–117)
BUN: 21 mg/dL (ref 6–23)
CO2: 24 mEq/L (ref 19–32)
Calcium: 9.3 mg/dL (ref 8.4–10.5)
Chloride: 104 mEq/L (ref 96–112)
Creatinine, Ser: 0.8 mg/dL (ref 0.40–1.50)
GFR: 86.32 mL/min (ref >60.00)
Glucose, Bld: 135 mg/dL — ABNORMAL HIGH (ref 70–99)
Potassium: 4.6 mEq/L (ref 3.5–5.1)
Sodium: 139 mEq/L (ref 135–145)
Total Bilirubin: 1.1 mg/dL (ref 0.2–1.2)
Total Protein: 6.3 g/dL (ref 6.0–8.3)

## 2021-02-19 LAB — HEMOGLOBIN A1C: Hgb A1c MFr Bld: 7 % — ABNORMAL HIGH (ref 4.6–6.5)

## 2021-02-19 MED ORDER — DOXYCYCLINE HYCLATE 100 MG PO TABS: 100.0000 mg | ORAL_TABLET | Freq: Two times a day (BID) | ORAL | 0 refills | Status: DC

## 2021-02-19 MED ORDER — CARVEDILOL 6.25 MG PO TABS: 6.2500 mg | ORAL_TABLET | Freq: Two times a day (BID) | ORAL | 1 refills | Status: DC

## 2021-02-19 MED ORDER — METFORMIN HCL 1000 MG PO TABS: 1000.0000 mg | ORAL_TABLET | Freq: Two times a day (BID) | ORAL | 1 refills | Status: DC

## 2021-02-19 NOTE — Patient Instructions (Signed)
Antibiotic for 1 week as that should continue to improve the infection around the toe.  See information below.  If redness does not continue to improve or if not resolved at the end of the antibiotic, return for recheck.  I will refer you to gastroenterology to discuss the thin stools.  Please let them know your concerns regarding prior colonoscopy if that is mentioned as an option.   I will check your labs today, no med changes at this time.  Hopefully the change in how you use your blood pressure cuff may be more effective.  See information below.  26-monthfollow-up for other labs but let me know if there are questions sooner and thank you for coming in today.  How to Take Your Blood Pressure Blood pressure is a measurement of how strongly your blood is pressing against the walls of your arteries. Arteries are blood vessels that carry blood from your heart throughout your body. Your health care provider takes your blood pressure at each office visit. You can also take your own blood pressure athome with a blood pressure monitor. You may need to take your own blood pressure to: Confirm a diagnosis of high blood pressure (hypertension). Monitor your blood pressure over time. Make sure your blood pressure medicine is working. Supplies needed: Blood pressure monitor. Dining room chair to sit in. Table or desk. Small notebook and pencil or pen. How to prepare To get the most accurate reading, avoid the following for 30 minutes before you check your blood pressure: Drinking caffeine. Drinking alcohol. Eating. Smoking. Exercising. Five minutes before you check your blood pressure: Use the bathroom and urinate so that you have an empty bladder. Sit quietly in a dining room chair. Do not sit in a soft couch or an armchair. Do not talk. How to take your blood pressure To check your blood pressure, follow the instructions in the manual that came with your blood pressure monitor. If you have a  digital blood pressure monitor, the instructions may be as follows: Sit up straight in a chair. Place your feet on the floor. Do not cross your ankles or legs. Rest your left arm at the level of your heart on a table or desk or on the arm of a chair. Pull up your shirt sleeve. Wrap the blood pressure cuff around the upper part of your left arm, 1 inch (2.5 cm) above your elbow. It is best to wrap the cuff around bare skin. Fit the cuff snugly around your arm. You should be able to place only one finger between the cuff and your arm. Position the cord so that it rests in the bend of your elbow. Press the power button. Sit quietly while the cuff inflates and deflates. Read the digital reading on the monitor screen and write the numbers down (record them) in a notebook. Wait 2-3 minutes, then repeat the steps, starting at step 1. What does my blood pressure reading mean? A blood pressure reading consists of a higher number over a lower number. Ideally, your blood pressure should be below 120/80. The first ("top") number is called the systolic pressure. It is a measure of the pressure in your arteries as your heart beats. The second ("bottom") number is called the diastolic pressure. It is a measure of the pressure in your arteries as theheart relaxes. Blood pressure is classified into five stages. The following are the stages for adults who do not have a short-term serious illness or a chronic condition. Systolic  pressure and diastolic pressure are measured in a unit called mm Hg (millimeters of mercury). Normal Systolic pressure: below 123456. Diastolic pressure: below 80. Elevated Systolic pressure: [id number]. Diastolic pressure: below 80. Hypertension stage 1 Systolic pressure: 0000000. Diastolic pressure: [id number]. Hypertension stage 2 Systolic pressure: [id number] or above. Diastolic pressure: 90 or above. You can have elevated blood pressure or hypertension even if only the systolicor only the  diastolic number in your reading is higher than normal. Follow these instructions at home: Medicines Take over-the-counter and prescription medicines only as told by your health care provider. Tell your health care provider if you are having any side effects from blood pressure medicine. General instructions Check your blood pressure as often as recommended by your health care provider. Check your blood pressure at the same time every day. Take your monitor to the next appointment with your health care provider to make sure that: You are using it correctly. It provides accurate readings. Understand what your goal blood pressure numbers are. Keep all follow-up visits as told by your health care provider. This is important. General tips Your health care provider can suggest a reliable monitor that will meet your needs. There are several types of home blood pressure monitors. Choose a monitor that has an arm cuff. Do not choose a monitor that measures your blood pressure from your wrist or finger. Choose a cuff that wraps snugly around your upper arm. You should be able to fit only one finger between your arm and the cuff. You can buy a blood pressure monitor at most drugstores or online. Where to find more information American Heart Association: www.heart.org Contact a health care provider if: Your blood pressure is consistently high. Your blood pressure is suddenly low. Get help right away if: Your systolic blood pressure is higher than 180. Your diastolic blood pressure is higher than 120. Summary Blood pressure is a measurement of how strongly your blood is pressing against the walls of your arteries. A blood pressure reading consists of a higher number over a lower number. Ideally, your blood pressure should be below 120/80. Check your blood pressure at the same time every day. Avoid caffeine, alcohol, smoking, and exercise for 30 minutes prior to checking your blood pressure. These  agents can affect the accuracy of the blood pressure reading. This information is not intended to replace advice given to you by your health care provider. Make sure you discuss any questions you have with your healthcare provider. Document Revised: 05/06/2020 Document Reviewed: 06/21/2019 Elsevier Patient Education  2022 [place] is an infection of the skin that surrounds a nail. It usually affects the skin around a fingernail, but it may also occur near a toenail. It often causes pain and swelling around the nail. In some cases, a collection of pus (abscess) can form near or under the nail.  This condition may develop suddenly, or it may develop gradually over a longer period. In most cases, paronychia is not serious, and it will clear up withtreatment. What are the causes? This condition may be caused by bacteria or a fungus. These germs can enter thebody through an opening in the skin, such as a cut or a hangnail. What increases the risk? This condition is more likely to develop in people who: Get their hands wet often, such as those who work as Designer, industrial/product, bartenders, or nurses. Bite their fingernails or suck their thumbs. Trim their nails very short. Have hangnails or injured fingertips. Get manicures. Have  diabetes. What are the signs or symptoms? Symptoms of this condition include: Redness and swelling of the skin near the nail. Tenderness around the nail when you touch the area. Pus-filled bumps under the skin at the base and sides of the nail (cuticle). Fluid or pus under the nail. Throbbing pain in the area. How is this diagnosed? This condition is diagnosed with a physical exam. In some cases, a sample of pus may be tested to determine what type of bacteria or fungus is causing thecondition. How is this treated? Treatment depends on the cause and severity of your condition. If your condition is mild, it may clear up on its own in a few days or  after soaking in warm water. If needed, treatment may include: Antibiotic medicine, if your infection is caused by bacteria. Antifungal medicine, if your infection is caused by a fungus. A procedure to drain pus from an abscess. Anti-inflammatory medicine (corticosteroids). Follow these instructions at home: Wound care Keep the affected area clean. Soak the affected area in warm water, if told to do so by your health care provider. You may be told to do this for 20 minutes, 2-3 times a day. Keep the area dry when you are not soaking it. Do not try to drain an abscess yourself. Follow instructions from your health care provider about how to take care of the affected area. Make sure you: Wash your hands with soap and water before you change your bandage (dressing). If soap and water are not available, use hand sanitizer. Change your dressing as told by your health care provider. If you had an abscess drained, check the area every day for signs of infection. Check for: Redness, swelling, or pain. Fluid or blood. Warmth. Pus or a bad smell. Medicines  Take over-the-counter and prescription medicines only as told by your health care provider. If you were prescribed an antibiotic medicine, take it as told by your health care provider. Do not stop taking the antibiotic even if you start to feel better.  General instructions Avoid contact with harsh chemicals. Do not pick at the affected area. Prevention To prevent this condition from happening again: Wear rubber gloves when washing dishes or doing other tasks that require your hands to get wet. Wear gloves if your hands might come in contact with cleaners or other chemicals. Avoid injuring your nails or fingertips. Do not bite your nails or tear hangnails. Do not cut your nails very short.  Do not cut your cuticles. Use clean nail clippers or scissors when trimming nails. Contact a health care provider if: Your symptoms get worse or do  not improve with treatment. You have continued or increased fluid, blood, or pus coming from the affected area. Your finger or knuckle becomes swollen or difficult to move. Get help right away if you have: A fever or chills. Redness spreading away from the affected area. Joint or muscle pain. Summary Paronychia is an infection of the skin that surrounds a nail. It often causes pain and swelling around the nail. In some cases, a collection of pus (abscess) can form near or under the nail. This condition may be caused by bacteria or a fungus. These germs can enter the body through an opening in the skin, such as a cut or a hangnail. If your condition is mild, it may clear up on its own in a few days. If needed, treatment may include medicine or a procedure to drain pus from an abscess. To prevent this condition  from happening again, wear gloves if doing tasks that require your hands to get wet or to come in contact with chemicals. Also avoid injuring your nails or fingertips. This information is not intended to replace advice given to you by your health care provider. Make sure you discuss any questions you have with your healthcare provider. Document Revised: 04/21/2020 Document Reviewed: 04/22/2020 Elsevier Patient Education  2022 [employer].

## 2021-02-19 NOTE — Progress Notes (Signed)
Subjective:  Patient ID: Christopher Bauer, male    DOB: [birth year]  Age: 75 y.o. MRN: [id number]  CC:  Chief Complaint  Patient presents with   Diabetes    Pt here for dm follow up, no concerns today, pt reports medication okay    Hypertension    Pt doing well brought home cuff to have verified with our cuff, no other concerns, pt in need of refill Coreg.  READINGS: pt cuff LT 138/76 RT 136/81  Office LT 138/80 RT 134/78 noticed pt had incorrectly placed cuff resulting in 142/79 on repeat with correct placement /tightening reduced to 138/76. Pt cuff accurate     HPI Linkin Gerges presents for   R great toe infection: Pedicure 2 weeks ago. Redness below nail. No fever Using leftover triamcinolone as Christopher Bauer thought was abx. - every other day. . No pus/drainage. Otc fungicide intially. Seems to be improving. Able to walk without difficulty.   Diabetes: With HLD, microalbuminuria - 9.6 in May.  On ACE-I and statin.  Metformin '1000mg'$  BID.    No symptoms of  low readings - not checking readings.   Optho, foot exam, pneumovax: up to date.   Lab Results  Component Value Date   HGBA1C 7.3 (H) 11/12/2020   HGBA1C 7.2 (H) 07/13/2020   HGBA1C 7.0 (H) 02/26/2020   Lab Results  Component Value Date   MICROALBUR 9.6 (H) 11/12/2020   LDLCALC 88 11/12/2020   CREATININE 0.67 11/12/2020   Hypertension: Lisinopril '40mg'$  qd, coreg 6.'25mg'$  BID.  Home readings elevated at times - see above - had to adjust use of cuff.  BP Readings from Last 3 Encounters:  02/19/21 138/76  11/12/20 134/76  07/13/20 (!) 142/78   Lab Results  Component Value Date   CREATININE 0.67 11/12/2020   Thin stools.  Past 6 months. No abd pain, wt loss, night sweats or fevers. Half caliber as prior. Daily BM.  Last colonoscopy in approx 2007 - will not go through again, had intestinal infection.  Negative Cologuard.   Lab Results  Component Value Date   CREATININE 0.67 11/12/2020       History Patient Active  Problem List   Diagnosis Date Noted   Former smoker 08/24/2016   Coronary artery calcification 02/04/2016   Abnormal liver function test 10/15/2015   Abnormal CXR 05/01/2014   Diabetes mellitus type II 04/13/2012   High cholesterol 04/13/2012   Osteoarthritis of both knees 04/13/2012   Past Medical History:  Diagnosis Date   Arthritis    Diabetes mellitus without complication ([place])    Hyperlipidemia    Hypertension    Prostatitis    Past Surgical History:  Procedure Laterality Date   APPENDECTOMY     Allergies  Allergen Reactions   Sulfa Antibiotics Itching   Prior to Admission medications   Medication Sig Start Date End Date Taking? Authorizing Provider  aspirin 81 MG tablet Take 81 mg by mouth daily.   Yes [provider]  atorvastatin (LIPITOR) 40 MG tablet Take 1 tablet (40 mg total) by mouth daily. 11/12/20  Yes Wendie Agreste, MD  carvedilol (COREG) 6.25 MG tablet Take 1 tablet (6.25 mg total) by mouth 2 (two) times daily with a meal. 11/12/20  Yes Wendie Agreste, MD  lisinopril (ZESTRIL) 40 MG tablet Take 1 tablet (40 mg total) by mouth daily. 09/09/20  Yes Wendie Agreste, MD  metFORMIN (GLUCOPHAGE) 1000 MG tablet Take 1 tablet (1,000 mg total) by mouth 2 (two) times  daily with a meal. 09/09/20  Yes Wendie Agreste, MD  triamcinolone cream (KENALOG) 0.1 % Apply 1 application topically 2 (two) times daily. To hands as needed. 11/12/20  Yes Wendie Agreste, MD   Social History   Socioeconomic History   Marital status: Divorced    Spouse name: Not on file   Number of children: 2   Years of education: Not on file   Highest education level: High school graduate  Occupational History   Not on file  Tobacco Use   Smoking status: Former    Packs/day: 0.25    Years: 50.00    Pack years: 12.50    Types: Cigarettes    Quit date: 03/18/2016    Years since quitting: 4.9   Smokeless tobacco: Never  Vaping Use   Vaping Use: Never used  Substance and Sexual  Activity   Alcohol use: Yes    Alcohol/week: 0.0 standard drinks    Comment: 4 beers once a week    Drug use: No   Sexual activity: Not on file  Other Topics Concern   Not on file  Social History Narrative   Not on file   Social Determinants of Health   Financial Resource Strain: Not on file  Food Insecurity: Not on file  Transportation Needs: Not on file  Physical Activity: Not on file  Stress: Not on file  Social Connections: Not on file  Intimate Partner Violence: Not on file    Review of Systems  Constitutional:  Negative for fatigue and unexpected weight change.  Eyes:  Negative for visual disturbance.  Respiratory:  Negative for cough, chest tightness and shortness of breath.   Cardiovascular:  Negative for chest pain, palpitations and leg swelling.  Gastrointestinal:  Negative for abdominal distention, abdominal pain, blood in stool, constipation, diarrhea, nausea and vomiting.  Skin:  Positive for color change.  Neurological:  Negative for dizziness, light-headedness and headaches.    Objective:   Vitals:   02/19/21 0941  BP: 138/76  Pulse: 78  Resp: 17  Temp: 98.3 F (36.8 C)  TempSrc: Temporal  SpO2: 97%  Weight: 239 lb (108.4 kg)  Height: 6' (1.829 m)     Physical Exam Vitals reviewed.  Constitutional:      Appearance: Christopher Bauer is well-developed.  HENT:     Head: Normocephalic and atraumatic.  Neck:     Vascular: No carotid bruit or JVD.  Cardiovascular:     Rate and Rhythm: Normal rate and regular rhythm.     Heart sounds: Normal heart sounds. No murmur heard. Pulmonary:     Effort: Pulmonary effort is normal.     Breath sounds: Normal breath sounds. No rales.  Musculoskeletal:     Right lower leg: No edema.     Left lower leg: No edema.     Comments: See photo of right great toe, erythema at the proximal and lateral nail fold.  Some crusting noted at the lateral nail fold but no active discharge, no fluctuance.  Skin:    General: Skin is warm  and dry.  Neurological:     Mental Status: Christopher Bauer is alert and oriented to person, place, and time.  Psychiatric:        Mood and Affect: Mood normal.         Assessment & Plan:  Christopher Bauer is a 75 y.o. male . DM type 2 with diabetic mixed hyperlipidemia ([place]) - Plan: metFORMIN (GLUCOPHAGE) 1000 MG tablet, Hemoglobin A1c, Comprehensive metabolic panel  -  Check A1c,, continue same regimen.  Essential hypertension - Plan: Comprehensive metabolic panel, carvedilol (COREG) 6.25 MG tablet  -Stable on recheck, home monitoring discussed and plans on changing approach with use of his home monitor.  Continue same regimen.  Check labs.  Change in stool caliber - Plan: Ambulatory referral to Gastroenterology  -We will refer to GI given persistent change in stools past 6 months.  Previous Cologuard reassuring.  Christopher Bauer would not want to proceed with colonoscopy at this point, but will still have him meet with GI to see if further work-up indicated.  Paronychia of great toe of right foot - Plan: doxycycline (VIBRA-TABS) 100 MG tablet  -Improving.  No localized abscess for I&D.  Start doxycycline with RTC precautions, handout given  Meds ordered this encounter  Medications   metFORMIN (GLUCOPHAGE) 1000 MG tablet    Sig: Take 1 tablet (1,000 mg total) by mouth 2 (two) times daily with a meal.    Dispense:  180 tablet    Refill:  1   carvedilol (COREG) 6.25 MG tablet    Sig: Take 1 tablet (6.25 mg total) by mouth 2 (two) times daily with a meal.    Dispense:  180 tablet    Refill:  1   doxycycline (VIBRA-TABS) 100 MG tablet    Sig: Take 1 tablet (100 mg total) by mouth 2 (two) times daily.    Dispense:  14 tablet    Refill:  0   Patient Instructions  Antibiotic for 1 week as that should continue to improve the infection around the toe.  See information below.  If redness does not continue to improve or if not resolved at the end of the antibiotic, return for recheck.  I will refer you to  gastroenterology to discuss the thin stools.  Please let them know your concerns regarding prior colonoscopy if that is mentioned as an option.   I will check your labs today, no med changes at this time.  Hopefully the change in how you use your blood pressure cuff may be more effective.  See information below.  24-monthfollow-up for other labs but let me know if there are questions sooner and thank you for coming in today.  How to Take Your Blood Pressure Blood pressure is a measurement of how strongly your blood is pressing against the walls of your arteries. Arteries are blood vessels that carry blood from your heart throughout your body. Your health care provider takes your blood pressure at each office visit. You can also take your own blood pressure athome with a blood pressure monitor. You may need to take your own blood pressure to: Confirm a diagnosis of high blood pressure (hypertension). Monitor your blood pressure over time. Make sure your blood pressure medicine is working. Supplies needed: Blood pressure monitor. Dining room chair to sit in. Table or desk. Small notebook and pencil or pen. How to prepare To get the most accurate reading, avoid the following for 30 minutes before you check your blood pressure: Drinking caffeine. Drinking alcohol. Eating. Smoking. Exercising. Five minutes before you check your blood pressure: Use the bathroom and urinate so that you have an empty bladder. Sit quietly in a dining room chair. Do not sit in a soft couch or an armchair. Do not talk. How to take your blood pressure To check your blood pressure, follow the instructions in the manual that came with your blood pressure monitor. If you have a digital blood pressure monitor, the instructions may  be as follows: Sit up straight in a chair. Place your feet on the floor. Do not cross your ankles or legs. Rest your left arm at the level of your heart on a table or desk or on the arm of a  chair. Pull up your shirt sleeve. Wrap the blood pressure cuff around the upper part of your left arm, 1 inch (2.5 cm) above your elbow. It is best to wrap the cuff around bare skin. Fit the cuff snugly around your arm. You should be able to place only one finger between the cuff and your arm. Position the cord so that it rests in the bend of your elbow. Press the power button. Sit quietly while the cuff inflates and deflates. Read the digital reading on the monitor screen and write the numbers down (record them) in a notebook. Wait 2-3 minutes, then repeat the steps, starting at step 1. What does my blood pressure reading mean? A blood pressure reading consists of a higher number over a lower number. Ideally, your blood pressure should be below 120/80. The first ("top") number is called the systolic pressure. It is a measure of the pressure in your arteries as your heart beats. The second ("bottom") number is called the diastolic pressure. It is a measure of the pressure in your arteries as theheart relaxes. Blood pressure is classified into five stages. The following are the stages for adults who do not have a short-term serious illness or a chronic condition. Systolic pressure and diastolic pressure are measured in a unit called mm Hg (millimeters of mercury). Normal Systolic pressure: below 123456. Diastolic pressure: below 80. Elevated Systolic pressure: [id number]. Diastolic pressure: below 80. Hypertension stage 1 Systolic pressure: 0000000. Diastolic pressure: [id number]. Hypertension stage 2 Systolic pressure: [id number] or above. Diastolic pressure: 90 or above. You can have elevated blood pressure or hypertension even if only the systolicor only the diastolic number in your reading is higher than normal. Follow these instructions at home: Medicines Take over-the-counter and prescription medicines only as told by your health care provider. Tell your health care provider if you are having any side  effects from blood pressure medicine. General instructions Check your blood pressure as often as recommended by your health care provider. Check your blood pressure at the same time every day. Take your monitor to the next appointment with your health care provider to make sure that: You are using it correctly. It provides accurate readings. Understand what your goal blood pressure numbers are. Keep all follow-up visits as told by your health care provider. This is important. General tips Your health care provider can suggest a reliable monitor that will meet your needs. There are several types of home blood pressure monitors. Choose a monitor that has an arm cuff. Do not choose a monitor that measures your blood pressure from your wrist or finger. Choose a cuff that wraps snugly around your upper arm. You should be able to fit only one finger between your arm and the cuff. You can buy a blood pressure monitor at most drugstores or online. Where to find more information American Heart Association: www.heart.org Contact a health care provider if: Your blood pressure is consistently high. Your blood pressure is suddenly low. Get help right away if: Your systolic blood pressure is higher than 180. Your diastolic blood pressure is higher than 120. Summary Blood pressure is a measurement of how strongly your blood is pressing against the walls of your arteries. A blood pressure reading consists of  a higher number over a lower number. Ideally, your blood pressure should be below 120/80. Check your blood pressure at the same time every day. Avoid caffeine, alcohol, smoking, and exercise for 30 minutes prior to checking your blood pressure. These agents can affect the accuracy of the blood pressure reading. This information is not intended to replace advice given to you by your health care provider. Make sure you discuss any questions you have with your healthcare provider. Document Revised:  05/06/2020 Document Reviewed: 06/21/2019 Elsevier Patient Education  2022 [place] is an infection of the skin that surrounds a nail. It usually affects the skin around a fingernail, but it may also occur near a toenail. It often causes pain and swelling around the nail. In some cases, a collection of pus (abscess) can form near or under the nail.  This condition may develop suddenly, or it may develop gradually over a longer period. In most cases, paronychia is not serious, and it will clear up withtreatment. What are the causes? This condition may be caused by bacteria or a fungus. These germs can enter thebody through an opening in the skin, such as a cut or a hangnail. What increases the risk? This condition is more likely to develop in people who: Get their hands wet often, such as those who work as Designer, industrial/product, bartenders, or nurses. Bite their fingernails or suck their thumbs. Trim their nails very short. Have hangnails or injured fingertips. Get manicures. Have diabetes. What are the signs or symptoms? Symptoms of this condition include: Redness and swelling of the skin near the nail. Tenderness around the nail when you touch the area. Pus-filled bumps under the skin at the base and sides of the nail (cuticle). Fluid or pus under the nail. Throbbing pain in the area. How is this diagnosed? This condition is diagnosed with a physical exam. In some cases, a sample of pus may be tested to determine what type of bacteria or fungus is causing thecondition. How is this treated? Treatment depends on the cause and severity of your condition. If your condition is mild, it may clear up on its own in a few days or after soaking in warm water. If needed, treatment may include: Antibiotic medicine, if your infection is caused by bacteria. Antifungal medicine, if your infection is caused by a fungus. A procedure to drain pus from an abscess. Anti-inflammatory  medicine (corticosteroids). Follow these instructions at home: Wound care Keep the affected area clean. Soak the affected area in warm water, if told to do so by your health care provider. You may be told to do this for 20 minutes, 2-3 times a day. Keep the area dry when you are not soaking it. Do not try to drain an abscess yourself. Follow instructions from your health care provider about how to take care of the affected area. Make sure you: Wash your hands with soap and water before you change your bandage (dressing). If soap and water are not available, use hand sanitizer. Change your dressing as told by your health care provider. If you had an abscess drained, check the area every day for signs of infection. Check for: Redness, swelling, or pain. Fluid or blood. Warmth. Pus or a bad smell. Medicines  Take over-the-counter and prescription medicines only as told by your health care provider. If you were prescribed an antibiotic medicine, take it as told by your health care provider. Do not stop taking the antibiotic even if you start  to feel better.  General instructions Avoid contact with harsh chemicals. Do not pick at the affected area. Prevention To prevent this condition from happening again: Wear rubber gloves when washing dishes or doing other tasks that require your hands to get wet. Wear gloves if your hands might come in contact with cleaners or other chemicals. Avoid injuring your nails or fingertips. Do not bite your nails or tear hangnails. Do not cut your nails very short.  Do not cut your cuticles. Use clean nail clippers or scissors when trimming nails. Contact a health care provider if: Your symptoms get worse or do not improve with treatment. You have continued or increased fluid, blood, or pus coming from the affected area. Your finger or knuckle becomes swollen or difficult to move. Get help right away if you have: A fever or chills. Redness spreading  away from the affected area. Joint or muscle pain. Summary Paronychia is an infection of the skin that surrounds a nail. It often causes pain and swelling around the nail. In some cases, a collection of pus (abscess) can form near or under the nail. This condition may be caused by bacteria or a fungus. These germs can enter the body through an opening in the skin, such as a cut or a hangnail. If your condition is mild, it may clear up on its own in a few days. If needed, treatment may include medicine or a procedure to drain pus from an abscess. To prevent this condition from happening again, wear gloves if doing tasks that require your hands to get wet or to come in contact with chemicals. Also avoid injuring your nails or fingertips. This information is not intended to replace advice given to you by your health care provider. Make sure you discuss any questions you have with your healthcare provider. Document Revised: 04/21/2020 Document Reviewed: 04/22/2020 Elsevier Patient Education  2022 [place],   Merri Ray, MD [place] 02/19/21 1:31 PM

## 2021-05-28 ENCOUNTER — Encounter: Payer: Self-pay | Admitting: Family Medicine

## 2021-05-28 ENCOUNTER — Ambulatory Visit (INDEPENDENT_AMBULATORY_CARE_PROVIDER_SITE_OTHER): Payer: Medicare HMO | Admitting: Family Medicine

## 2021-05-28 VITALS — BP 128/74 | HR 73 | Temp 98.2°F | Resp 16 | Ht 72.0 in | Wt 242.4 lb

## 2021-05-28 DIAGNOSIS — Z23 Encounter for immunization: Secondary | ICD-10-CM

## 2021-05-28 DIAGNOSIS — I2510 Atherosclerotic heart disease of native coronary artery without angina pectoris: Secondary | ICD-10-CM | POA: Diagnosis not present

## 2021-05-28 DIAGNOSIS — E1169 Type 2 diabetes mellitus with other specified complication: Secondary | ICD-10-CM

## 2021-05-28 DIAGNOSIS — I10 Essential (primary) hypertension: Secondary | ICD-10-CM | POA: Diagnosis not present

## 2021-05-28 DIAGNOSIS — R195 Other fecal abnormalities: Secondary | ICD-10-CM

## 2021-05-28 DIAGNOSIS — E782 Mixed hyperlipidemia: Secondary | ICD-10-CM

## 2021-05-28 DIAGNOSIS — E785 Hyperlipidemia, unspecified: Secondary | ICD-10-CM | POA: Diagnosis not present

## 2021-05-28 LAB — COMPREHENSIVE METABOLIC PANEL
ALT: 38 U/L (ref 0–53)
AST: 33 U/L (ref 0–37)
Albumin: 4.2 g/dL (ref 3.5–5.2)
Alkaline Phosphatase: 110 U/L (ref 39–117)
BUN: 10 mg/dL (ref 6–23)
CO2: 24 mEq/L (ref 19–32)
Calcium: 9.3 mg/dL (ref 8.4–10.5)
Chloride: 101 mEq/L (ref 96–112)
Creatinine, Ser: 0.68 mg/dL (ref 0.40–1.50)
GFR: 90.49 mL/min (ref >60.00)
Glucose, Bld: 148 mg/dL — ABNORMAL HIGH (ref 70–99)
Potassium: 4.6 mEq/L (ref 3.5–5.1)
Sodium: 136 mEq/L (ref 135–145)
Total Bilirubin: 1 mg/dL (ref 0.2–1.2)
Total Protein: 6.4 g/dL (ref 6.0–8.3)

## 2021-05-28 LAB — LIPID PANEL
Cholesterol: 153 mg/dL (ref 0–200)
HDL: 46.5 mg/dL (ref >39.00)
LDL Cholesterol: 76 mg/dL (ref 0–99)
NonHDL: 106.93
Total CHOL/HDL Ratio: 3
Triglycerides: 155 mg/dL — ABNORMAL HIGH (ref 0.0–149.0)
VLDL: 31 mg/dL (ref 0.0–40.0)

## 2021-05-28 LAB — HEMOGLOBIN A1C: Hgb A1c MFr Bld: 7.1 % — ABNORMAL HIGH (ref 4.6–6.5)

## 2021-05-28 MED ORDER — ATORVASTATIN CALCIUM 40 MG PO TABS: 40.0000 mg | ORAL_TABLET | Freq: Every day | ORAL | 2 refills | Status: DC

## 2021-05-28 MED ORDER — LISINOPRIL 40 MG PO TABS: 40.0000 mg | ORAL_TABLET | Freq: Every day | ORAL | 2 refills | Status: DC

## 2021-05-28 MED ORDER — METFORMIN HCL 1000 MG PO TABS
1000.0000 mg | ORAL_TABLET | Freq: Two times a day (BID) | ORAL | 1 refills | Status: DC
Start: 2021-05-28 — End: 2021-11-15

## 2021-05-28 NOTE — Progress Notes (Signed)
Subjective:  Patient ID: Christopher Bauer, male    DOB: February 03, 1946  Age: 75 y.o. MRN: [id number]  CC:  Chief Complaint  Patient presents with   Diabetes    Pt here for recheck no concerns today    Hypertension    Due for recheck, denies physical sxs,    Hyperlipidemia    Due for recheck    HPI Christopher Bauer presents for   Diabetes: With hyperlipidemia, microalbuminuria.  9.6 in May of this year. Treated with metformin 1000 mg twice daily. He is on ACE inhibitor and statin. No home readings.  No new side effects or symptomatic lows.  Optho, foot exam, pneumovax:  Influenza vaccine: today.  COVID-vaccine, primary series in February and March 2021, booster October 2021  Ophthalmology eval: due - appt rescheduled for next few months.  Immunization History  Administered Date(s) Administered   Fluad Quad(high Dose 65+) 03/01/2019   Influenza Split 04/12/2013   Influenza,inj,Quad PF,6+ Mos 05/25/2016, 02/26/2020   Influenza-Unspecified 04/10/2014, 04/20/2015, 05/11/2018   Moderna Sars-Covid-2 Vaccination 08/26/2019, 09/24/2019   PFIZER(Purple Top)SARS-COV-2 Vaccination 05/05/2020   Pneumococcal Conjugate-13 05/01/2014   Pneumococcal Polysaccharide-23 02/04/2016   Tdap 02/15/2017     Lab Results  Component Value Date   HGBA1C 7.0 (H) 02/19/2021   HGBA1C 7.3 (H) 11/12/2020   HGBA1C 7.2 (H) 07/13/2020   Lab Results  Component Value Date   MICROALBUR 9.6 (H) 11/12/2020   [place] 88 11/12/2020   CREATININE 0.80 02/19/2021   Hypertension: With history of coronary artery calcification.  Cardiology Dr. Einar Gip.  Carvedilol 6.25 mg twice daily.  Lisinopril 40 mg daily Home readings:  120/70 range.  BP Readings from Last 3 Encounters:  05/28/21 128/74  02/19/21 138/76  11/12/20 134/76   Lab Results  Component Value Date   CREATININE 0.80 02/19/2021   Hyperlipidemia: Lipitor 40 mg daily. No new myalgias  Lab Results  Component Value Date   CHOL 154 11/12/2020   HDL 38.00  (L) 11/12/2020   LDLCALC 88 11/12/2020   TRIG 137.0 11/12/2020   CHOLHDL 4 11/12/2020   Lab Results  Component Value Date   ALT 31 02/19/2021   AST 26 02/19/2021   ALKPHOS 105 02/19/2021   BILITOT 1.1 02/19/2021   Prior change in stool caliber.  No further bowel issues  - cleared up after last visit. Still plans on GI follow up.    History Patient Active Problem List   Diagnosis Date Noted   Former smoker 08/24/2016   Coronary artery calcification 02/04/2016   Abnormal liver function test 10/15/2015   Abnormal CXR 05/01/2014   Diabetes mellitus type II 04/13/2012   High cholesterol 04/13/2012   Osteoarthritis of both knees 04/13/2012   Past Medical History:  Diagnosis Date   Arthritis    Diabetes mellitus without complication ([place])    Hyperlipidemia    Hypertension    Prostatitis    Past Surgical History:  Procedure Laterality Date   APPENDECTOMY     Allergies  Allergen Reactions   Sulfa Antibiotics Itching   Prior to Admission medications   Medication Sig Start Date End Date Taking? Authorizing Provider  aspirin 81 MG tablet Take 81 mg by mouth daily.   Yes [provider]  atorvastatin (LIPITOR) 40 MG tablet Take 1 tablet (40 mg total) by mouth daily. 11/12/20  Yes Wendie Agreste, MD  carvedilol (COREG) 6.25 MG tablet Take 1 tablet (6.25 mg total) by mouth 2 (two) times daily with a meal. 02/19/21  Yes  Wendie Agreste, MD  doxycycline (VIBRA-TABS) 100 MG tablet Take 1 tablet (100 mg total) by mouth 2 (two) times daily. 02/19/21  Yes Wendie Agreste, MD  lisinopril (ZESTRIL) 40 MG tablet Take 1 tablet (40 mg total) by mouth daily. 09/09/20  Yes Wendie Agreste, MD  metFORMIN (GLUCOPHAGE) 1000 MG tablet Take 1 tablet (1,000 mg total) by mouth 2 (two) times daily with a meal. 02/19/21  Yes Wendie Agreste, MD  triamcinolone cream (KENALOG) 0.1 % Apply 1 application topically 2 (two) times daily. To hands as needed. 11/12/20  Yes Wendie Agreste, MD    Social History   Socioeconomic History   Marital status: Divorced    Spouse name: Not on file   Number of children: 2   Years of education: Not on file   Highest education level: High school graduate  Occupational History   Not on file  Tobacco Use   Smoking status: Former    Packs/day: 0.25    Years: 50.00    Pack years: 12.50    Types: Cigarettes    Quit date: 03/18/2016    Years since quitting: 5.1   Smokeless tobacco: Never  Vaping Use   Vaping Use: Never used  Substance and Sexual Activity   Alcohol use: Yes    Alcohol/week: 0.0 standard drinks    Comment: 4 beers once a week    Drug use: No   Sexual activity: Not on file  Other Topics Concern   Not on file  Social History Narrative   Not on file   Social Determinants of Health   Financial Resource Strain: Not on file  Food Insecurity: Not on file  Transportation Needs: Not on file  Physical Activity: Not on file  Stress: Not on file  Social Connections: Not on file  Intimate Partner Violence: Not on file    Review of Systems  Constitutional:  Negative for fatigue and unexpected weight change.  Eyes:  Negative for visual disturbance.  Respiratory:  Negative for cough, chest tightness and shortness of breath.   Cardiovascular:  Negative for chest pain, palpitations and leg swelling.  Gastrointestinal:  Negative for abdominal pain and blood in stool.  Neurological:  Negative for dizziness, light-headedness and headaches.    Objective:   Vitals:   05/28/21 0850  BP: 128/74  Pulse: 73  Resp: 16  Temp: 98.2 F (36.8 C)  TempSrc: Temporal  SpO2: 95%  Weight: 242 lb 6.4 oz (110 kg)  Height: 6' (1.829 m)     Physical Exam Vitals reviewed.  Constitutional:      Appearance: He is well-developed.  HENT:     Head: Normocephalic and atraumatic.  Neck:     Vascular: No carotid bruit or JVD.  Cardiovascular:     Rate and Rhythm: Normal rate and regular rhythm.     Heart sounds: Normal heart  sounds. No murmur heard. Pulmonary:     Effort: Pulmonary effort is normal.     Breath sounds: Normal breath sounds. No rales.  Abdominal:     General: There is no distension.     Tenderness: There is no abdominal tenderness. There is no guarding.  Musculoskeletal:     Right lower leg: No edema.     Left lower leg: No edema.  Skin:    General: Skin is warm and dry.  Neurological:     Mental Status: He is alert and oriented to person, place, and time.  Psychiatric:  Mood and Affect: Mood normal.       Assessment & Plan:  Jakevion Arney is a 75 y.o. male . DM type 2 with diabetic mixed hyperlipidemia ([place]) - Plan: Hemoglobin A1c, metFORMIN (GLUCOPHAGE) 1000 MG tablet  -  Stable, tolerating current regimen. Medications refilled. Labs pending as above.   Essential hypertension - Plan: lisinopril (ZESTRIL) 40 MG tablet, Comprehensive metabolic panel  -  Stable, tolerating current regimen. Medications refilled. Labs pending as above.   Coronary artery disease involving native heart without angina pectoris, unspecified vessel or lesion type - Plan: atorvastatin (LIPITOR) 40 MG tablet, Lipid panel  -Asymptomatic, continue statin, carvedilol.  Need for influenza vaccination - Plan: Flu Vaccine QUAD High Dose(Fluad)  Hyperlipidemia, unspecified hyperlipidemia type - Plan: atorvastatin (LIPITOR) 40 MG tablet, Lipid panel  -Tolerating statin, continue same  Change in stool caliber  -Symptoms resolved, still plans on GI follow-up.  No orders of the defined types were placed in this encounter.  Patient Instructions  I do recommend newest covid booster. [facility] may offer it, or [facility] is now offering the bivalent Covid booster vaccine. To schedule an appointment or to find more information, visit [email or web address] You can also call [phone], Monday through Friday, 7 a.m. to 7 p.m. No med changes at this time.  Recheck in 6 months, let me know if there are questions.        Signed,   Merri Ray, MD [place] 05/28/21 9:24 AM

## 2021-05-28 NOTE — Patient Instructions (Addendum)
I do recommend newest covid booster. [facility] may offer it, or [facility] is now offering the bivalent Covid booster vaccine. To schedule an appointment or to find more information, visit [email or web address] You can also call [phone], Monday through Friday, 7 a.m. to 7 p.m. No med changes at this time.  Recheck in 6 months, let me know if there are questions.

## 2021-05-31 ENCOUNTER — Telehealth: Payer: Self-pay

## 2021-05-31 NOTE — Telephone Encounter (Signed)
Placed these forms in Dr Mancel Bale to be signed folder at nurse station

## 2021-05-31 NOTE — Telephone Encounter (Signed)
Caller name:Sergey Arbogast   On DPR? :No  Call back number:[phone]  Provider they see: Carlota Raspberry  Reason for call:Pt dropped of DMV Disability Parking Pass Forms to be completed placed in Dr Carlota Raspberry bin

## 2021-06-01 NOTE — Telephone Encounter (Signed)
Form was completed and placed in out going mail with pt preaddressed envelope

## 2021-06-08 ENCOUNTER — Encounter: Payer: Self-pay | Admitting: Family Medicine

## 2021-06-10 DIAGNOSIS — E119 Type 2 diabetes mellitus without complications: Secondary | ICD-10-CM | POA: Diagnosis not present

## 2021-06-10 DIAGNOSIS — Z961 Presence of intraocular lens: Secondary | ICD-10-CM | POA: Diagnosis not present

## 2021-06-10 LAB — HM DIABETES EYE EXAM

## 2021-07-08 ENCOUNTER — Encounter: Payer: Self-pay | Admitting: Family Medicine

## 2021-07-09 ENCOUNTER — Encounter: Payer: Self-pay | Admitting: Family Medicine

## 2021-07-09 NOTE — Telephone Encounter (Signed)
Call received from [place] for this patient. Patient states that he does not currently have any symptoms besides a slight cough. Patient was educated on the need to quarantine and use of masks. He was also advised that if his symptoms worsen he could use [facility] be seen over the holiday weekend. No follow up needed at this time.

## 2021-07-27 ENCOUNTER — Encounter: Payer: Self-pay | Admitting: Family Medicine

## 2021-08-08 ENCOUNTER — Other Ambulatory Visit: Payer: Self-pay | Admitting: Family Medicine

## 2021-08-08 DIAGNOSIS — L299 Pruritus, unspecified: Secondary | ICD-10-CM

## 2021-08-08 DIAGNOSIS — S60512A Abrasion of left hand, initial encounter: Secondary | ICD-10-CM

## 2021-08-08 DIAGNOSIS — I10 Essential (primary) hypertension: Secondary | ICD-10-CM

## 2021-10-04 ENCOUNTER — Other Ambulatory Visit: Payer: Self-pay | Admitting: Family Medicine

## 2021-10-04 DIAGNOSIS — L299 Pruritus, unspecified: Secondary | ICD-10-CM

## 2021-10-04 DIAGNOSIS — S60512A Abrasion of left hand, initial encounter: Secondary | ICD-10-CM

## 2021-10-22 ENCOUNTER — Other Ambulatory Visit: Payer: Self-pay | Admitting: Family Medicine

## 2021-10-22 DIAGNOSIS — L299 Pruritus, unspecified: Secondary | ICD-10-CM

## 2021-10-22 DIAGNOSIS — S60512A Abrasion of left hand, initial encounter: Secondary | ICD-10-CM

## 2021-11-09 ENCOUNTER — Encounter: Payer: Self-pay | Admitting: Family Medicine

## 2021-11-09 NOTE — Progress Notes (Signed)
Note sent to patient

## 2021-11-15 ENCOUNTER — Encounter: Payer: Self-pay | Admitting: Family Medicine

## 2021-11-15 ENCOUNTER — Ambulatory Visit (INDEPENDENT_AMBULATORY_CARE_PROVIDER_SITE_OTHER): Payer: Medicare HMO | Admitting: Family Medicine

## 2021-11-15 VITALS — BP 134/66 | HR 73 | Temp 98.1°F | Resp 16 | Ht 72.0 in | Wt 237.0 lb

## 2021-11-15 DIAGNOSIS — I2510 Atherosclerotic heart disease of native coronary artery without angina pectoris: Secondary | ICD-10-CM

## 2021-11-15 DIAGNOSIS — E1169 Type 2 diabetes mellitus with other specified complication: Secondary | ICD-10-CM

## 2021-11-15 DIAGNOSIS — E782 Mixed hyperlipidemia: Secondary | ICD-10-CM | POA: Diagnosis not present

## 2021-11-15 DIAGNOSIS — L98499 Non-pressure chronic ulcer of skin of other sites with unspecified severity: Secondary | ICD-10-CM | POA: Diagnosis not present

## 2021-11-15 DIAGNOSIS — I10 Essential (primary) hypertension: Secondary | ICD-10-CM

## 2021-11-15 DIAGNOSIS — E785 Hyperlipidemia, unspecified: Secondary | ICD-10-CM | POA: Diagnosis not present

## 2021-11-15 LAB — COMPREHENSIVE METABOLIC PANEL
ALT: 18 U/L (ref 0–53)
AST: 16 U/L (ref 0–37)
Albumin: 4.1 g/dL (ref 3.5–5.2)
Alkaline Phosphatase: 104 U/L (ref 39–117)
BUN: 12 mg/dL (ref 6–23)
CO2: 26 mEq/L (ref 19–32)
Calcium: 9.2 mg/dL (ref 8.4–10.5)
Chloride: 103 mEq/L (ref 96–112)
Creatinine, Ser: 0.63 mg/dL (ref 0.40–1.50)
GFR: 92.29 mL/min (ref >60.00)
Glucose, Bld: 142 mg/dL — ABNORMAL HIGH (ref 70–99)
Potassium: 4.6 mEq/L (ref 3.5–5.1)
Sodium: 138 mEq/L (ref 135–145)
Total Bilirubin: 0.8 mg/dL (ref 0.2–1.2)
Total Protein: 6.3 g/dL (ref 6.0–8.3)

## 2021-11-15 LAB — LIPID PANEL
Cholesterol: 179 mg/dL (ref 0–200)
HDL: 54.3 mg/dL (ref >39.00)
LDL Cholesterol: 86 mg/dL (ref 0–99)
NonHDL: 124.98
Total CHOL/HDL Ratio: 3
Triglycerides: 195 mg/dL — ABNORMAL HIGH (ref 0.0–149.0)
VLDL: 39 mg/dL (ref 0.0–40.0)

## 2021-11-15 LAB — MICROALBUMIN / CREATININE URINE RATIO
Creatinine,U: 81.8 mg/dL
Microalb Creat Ratio: 6.8 mg/g (ref 0.0–30.0)
Microalb, Ur: 5.6 mg/dL — ABNORMAL HIGH (ref 0.0–1.9)

## 2021-11-15 LAB — POCT GLYCOSYLATED HEMOGLOBIN (HGB A1C): Hemoglobin A1C: 7.2 % — AB (ref 4.0–5.6)

## 2021-11-15 MED ORDER — CARVEDILOL 6.25 MG PO TABS: 6.2500 mg | ORAL_TABLET | Freq: Two times a day (BID) | ORAL | 1 refills | Status: DC

## 2021-11-15 MED ORDER — ATORVASTATIN CALCIUM 40 MG PO TABS: 40.0000 mg | ORAL_TABLET | Freq: Every day | ORAL | 2 refills | Status: DC

## 2021-11-15 MED ORDER — METFORMIN HCL 1000 MG PO TABS: 1000.0000 mg | ORAL_TABLET | Freq: Two times a day (BID) | ORAL | 1 refills | Status: DC

## 2021-11-15 MED ORDER — LISINOPRIL 40 MG PO TABS: 40.0000 mg | ORAL_TABLET | Freq: Every day | ORAL | 2 refills | Status: DC

## 2021-11-15 NOTE — Patient Instructions (Signed)
I will refer you to dermatology to evaluate the nonhealing ulcers of the hands.  Keep those areas clean and covered if working outside.  Follow-up with me sooner if signs of infection including redness or discharge from that area.  ?No medication changes for now, watch diet, walking or other low intensity exercise most days per week with a goal of 150 minutes/week should help improve the blood sugar control.  Let me know if there are questions. ?

## 2021-11-15 NOTE — Progress Notes (Signed)
? ?Subjective:  ?Patient ID: Christopher Bauer, male    DOB: 01/04/1946  Age: 76 y.o. MRN: [id number] ? ?CC:  ?Chief Complaint  ?Patient presents with  ? Hypertension  ?  Pt denies physical sxs   ? Diabetes  ?  Pt here for recheck no concerns, does not BG test at home   ? Laceration  ?  Pt has small abrasion to his Lt hand on one knuckle that he is concerned is taking a long time to heal wondered if you could give something other than triamcinolone cream that he has been using.  ?  ? ? ?HPI ?Christopher Bauer presents for  ? ?Diabetes: ?With hyperlipidemia, microalbuminuria.  treated with metformin 1000 twice daily, he is on lisinopril and statin. ?Home readings - none.  ?Symptomatic lows - none known. No side effects with meds.  ?Microalbumin: 9.6 on 11/12/20.  ?Optho, foot exam, pneumovax: Ophthalmology visit 06/10/2021, Dr. Katy Fitch.  ?Plans to increase activity and improve diet over the summer.  ? ?Lab Results  ?Component Value Date  ? HGBA1C 7.2 (A) 11/15/2021  ? HGBA1C 7.1 (H) 05/28/2021  ? HGBA1C 7.0 (H) 02/19/2021  ? ?Lab Results  ?Component Value Date  ? MICROALBUR 9.6 (H) 11/12/2020  ? [place] 76 05/28/2021  ? CREATININE 0.68 05/28/2021  ? ?Hypertension: ?With history of coronary artery calcification, cardiologist Dr. Einar Gip.  Treated with lisinopril 40 mg daily, carvedilol 6.25 mg twice daily. ?Home readings: rare. Stable when checking.  ?BP Readings from Last 3 Encounters:  ?11/15/21 134/66  ?05/28/21 128/74  ?02/19/21 138/76  ? ?Lab Results  ?Component Value Date  ? CREATININE 0.68 05/28/2021  ? ?Hyperlipidemia: ?Lipitor 40 mg daily, no new myalgias.  ?Lab Results  ?Component Value Date  ? CHOL 153 05/28/2021  ? HDL 46.50 05/28/2021  ? [name] 76 05/28/2021  ? TRIG 155.0 (H) 05/28/2021  ? CHOLHDL 3 05/28/2021  ? ?Lab Results  ?Component Value Date  ? ALT 38 05/28/2021  ? AST 33 05/28/2021  ? ALKPHOS 110 05/28/2021  ? BILITOT 1.0 05/28/2021  ? ? ?Wound of hand: ?Abrasion or cut of left hand. About 6 months ago, scabs, but  does not completely heal. Hand dermatitis treated with triamcinolone in the past.  ?No dermatologist recently - prior [facility]. No itching. No bleeding.  ? ?History ?Patient Active Problem List  ? Diagnosis Date Noted  ? Former smoker 08/24/2016  ? Coronary artery calcification 02/04/2016  ? Abnormal liver function test 10/15/2015  ? Abnormal CXR 05/01/2014  ? Diabetes mellitus type II 04/13/2012  ? High cholesterol 04/13/2012  ? Osteoarthritis of both knees 04/13/2012  ? ?Past Medical History:  ?Diagnosis Date  ? Arthritis   ? Diabetes mellitus without complication ([place])   ? Hyperlipidemia   ? Hypertension   ? Prostatitis   ? ?Past Surgical History:  ?Procedure Laterality Date  ? APPENDECTOMY    ? ?Allergies  ?Allergen Reactions  ? Sulfa Antibiotics Itching  ? ?Prior to Admission medications   ?Medication Sig Start Date End Date Taking? Authorizing Provider  ?aspirin 81 MG tablet Take 81 mg by mouth daily.   Yes [provider]  ?atorvastatin (LIPITOR) 40 MG tablet Take 1 tablet (40 mg total) by mouth daily. 05/28/21  Yes Wendie Agreste, MD  ?carvedilol (COREG) 6.25 MG tablet TAKE 1 TABLET TWICE DAILY  WITH MEALS 08/10/21  Yes Wendie Agreste, MD  ?doxycycline (VIBRA-TABS) 100 MG tablet Take 1 tablet (100 mg total) by mouth 2 (two) times daily.  02/19/21  Yes Wendie Agreste, MD  ?lisinopril (ZESTRIL) 40 MG tablet Take 1 tablet (40 mg total) by mouth daily. 05/28/21  Yes Wendie Agreste, MD  ?metFORMIN (GLUCOPHAGE) 1000 MG tablet Take 1 tablet (1,000 mg total) by mouth 2 (two) times daily with a meal. 05/28/21  Yes Wendie Agreste, MD  ?triamcinolone cream (KENALOG) 0.1 % APPLY 1 APPLICATION        TOPICALLY TWO TIMES A DAY  TO HANDS AS NEEDED 10/22/21   Wendie Agreste, MD  ? ?Social History  ? ?Socioeconomic History  ? Marital status: Divorced  ?  Spouse name: Not on file  ? Number of children: 2  ? Years of education: Not on file  ? Highest education level: High school graduate   ?Occupational History  ? Not on file  ?Tobacco Use  ? Smoking status: Former  ?  Packs/day: 0.25  ?  Years: 50.00  ?  Pack years: 12.50  ?  Types: Cigarettes  ?  Quit date: 03/18/2016  ?  Years since quitting: 5.6  ? Smokeless tobacco: Never  ?Vaping Use  ? Vaping Use: Never used  ?Substance and Sexual Activity  ? Alcohol use: Yes  ?  Alcohol/week: 0.0 standard drinks  ?  Comment: 4 beers once a week   ? Drug use: No  ? Sexual activity: Not on file  ?Other Topics Concern  ? Not on file  ?Social History Narrative  ? Not on file  ? ?Social Determinants of Health  ? ?Financial Resource Strain: Not on file  ?Food Insecurity: Not on file  ?Transportation Needs: Not on file  ?Physical Activity: Not on file  ?Stress: Not on file  ?Social Connections: Not on file  ?Intimate Partner Violence: Not on file  ? ? ?Review of Systems  ?Constitutional:  Negative for fatigue and unexpected weight change.  ?Eyes:  Negative for visual disturbance.  ?Respiratory:  Negative for cough, chest tightness and shortness of breath.   ?Cardiovascular:  Negative for chest pain, palpitations and leg swelling.  ?Gastrointestinal:  Negative for abdominal pain and blood in stool.  ?Neurological:  Negative for dizziness, light-headedness and headaches.  ? ? ?Objective:  ? ?Vitals:  ? 11/15/21 0834  ?BP: 134/66  ?Pulse: 73  ?Resp: 16  ?Temp: 98.1 ?F (36.7 ?C)  ?TempSrc: Temporal  ?SpO2: 96%  ?Weight: 237 lb (107.5 kg)  ?Height: 6' (1.829 m)  ? ? ? ?Physical Exam ?Vitals reviewed.  ?Constitutional:   ?   Appearance: He is well-developed.  ?HENT:  ?   Head: Normocephalic and atraumatic.  ?Neck:  ?   Vascular: No carotid bruit or JVD.  ?Cardiovascular:  ?   Rate and Rhythm: Normal rate and regular rhythm.  ?   Heart sounds: Normal heart sounds. No murmur heard. ?Pulmonary:  ?   Effort: Pulmonary effort is normal.  ?   Breath sounds: Normal breath sounds. No rales.  ?Musculoskeletal:  ?   Right lower leg: No edema.  ?   Left lower leg: No edema.  ?Skin: ?    General: Skin is warm and dry.  ? ?    ?Neurological:  ?   Mental Status: He is alert and oriented to person, place, and time.  ?Psychiatric:     ?   Mood and Affect: Mood normal.  ? ? ? ? ? ? ?Assessment & Plan:  ?Christin Mccreedy is a 76 y.o. male . ?Skin ulcer of hand, unspecified ulcer stage ([place]) -  Plan: Ambulatory referral to Dermatology ? -New concern.  Nonhealing ulcers, unsure if she may be picking at area.  Parents signs of infection at this time.  Refer to dermatology, keep areas clean and covered, RTC precautions. ? ?Essential hypertension - Plan: carvedilol (COREG) 6.25 MG tablet, Comprehensive metabolic panel, lisinopril (ZESTRIL) 40 MG tablet ? -  Stable, tolerating current regimen. Medications refilled. Labs pending as above.  ? ?DM type 2 with diabetic mixed hyperlipidemia ([place]) - Plan: POCT glycosylated hemoglobin (Hb A1C), metFORMIN (GLUCOPHAGE) 1000 MG tablet, Microalbumin / creatinine urine ratio ?  -Borderline control, decided against new medications at this time.  Plans on improved diet, exercise, recheck 6 months with repeat A1c.  Continue ACE inhibitor with microalbuminuria.  Updated labs ordered. ? ?Coronary artery disease involving native heart without angina pectoris, unspecified vessel or lesion type - Plan: Comprehensive metabolic panel, Lipid panel, atorvastatin (LIPITOR) 40 MG tablet ?Hyperlipidemia, unspecified hyperlipidemia type - Plan: atorvastatin (LIPITOR) 40 MG tablet ? -Asymptomatic, continue Lipitor 40 mg daily.  Check updated labs ? ?Meds ordered this encounter  ?Medications  ? carvedilol (COREG) 6.25 MG tablet  ?  Sig: Take 1 tablet (6.25 mg total) by mouth 2 (two) times daily with a meal.  ?  Dispense:  180 tablet  ?  Refill:  1  ? metFORMIN (GLUCOPHAGE) 1000 MG tablet  ?  Sig: Take 1 tablet (1,000 mg total) by mouth 2 (two) times daily with a meal.  ?  Dispense:  180 tablet  ?  Refill:  1  ? atorvastatin (LIPITOR) 40 MG tablet  ?  Sig: Take 1 tablet (40 mg total) by mouth  daily.  ?  Dispense:  90 tablet  ?  Refill:  2  ? lisinopril (ZESTRIL) 40 MG tablet  ?  Sig: Take 1 tablet (40 mg total) by mouth daily.  ?  Dispense:  90 tablet  ?  Refill:  2  ? ?Patient Instructions  ?I will re

## 2021-12-13 DIAGNOSIS — L98499 Non-pressure chronic ulcer of skin of other sites with unspecified severity: Secondary | ICD-10-CM | POA: Diagnosis not present

## 2021-12-13 DIAGNOSIS — Z85828 Personal history of other malignant neoplasm of skin: Secondary | ICD-10-CM | POA: Diagnosis not present

## 2021-12-13 DIAGNOSIS — D485 Neoplasm of uncertain behavior of skin: Secondary | ICD-10-CM | POA: Diagnosis not present

## 2021-12-13 DIAGNOSIS — L814 Other melanin hyperpigmentation: Secondary | ICD-10-CM | POA: Diagnosis not present

## 2021-12-13 DIAGNOSIS — D225 Melanocytic nevi of trunk: Secondary | ICD-10-CM | POA: Diagnosis not present

## 2021-12-13 DIAGNOSIS — C44311 Basal cell carcinoma of skin of nose: Secondary | ICD-10-CM | POA: Diagnosis not present

## 2021-12-13 DIAGNOSIS — L821 Other seborrheic keratosis: Secondary | ICD-10-CM | POA: Diagnosis not present

## 2021-12-22 ENCOUNTER — Encounter: Payer: Self-pay | Admitting: Family Medicine

## 2021-12-22 DIAGNOSIS — C44310 Basal cell carcinoma of skin of unspecified parts of face: Secondary | ICD-10-CM | POA: Insufficient documentation

## 2021-12-23 DIAGNOSIS — M25562 Pain in left knee: Secondary | ICD-10-CM | POA: Insufficient documentation

## 2022-01-24 DIAGNOSIS — L988 Other specified disorders of the skin and subcutaneous tissue: Secondary | ICD-10-CM | POA: Diagnosis not present

## 2022-01-24 DIAGNOSIS — D485 Neoplasm of uncertain behavior of skin: Secondary | ICD-10-CM | POA: Diagnosis not present

## 2022-01-27 ENCOUNTER — Other Ambulatory Visit: Payer: Self-pay | Admitting: Family Medicine

## 2022-01-27 DIAGNOSIS — L03031 Cellulitis of right toe: Secondary | ICD-10-CM

## 2022-02-01 DIAGNOSIS — M1712 Unilateral primary osteoarthritis, left knee: Secondary | ICD-10-CM | POA: Diagnosis not present

## 2022-02-02 DIAGNOSIS — M1712 Unilateral primary osteoarthritis, left knee: Secondary | ICD-10-CM | POA: Insufficient documentation

## 2022-02-07 DIAGNOSIS — C44311 Basal cell carcinoma of skin of nose: Secondary | ICD-10-CM | POA: Diagnosis not present

## 2022-02-12 ENCOUNTER — Other Ambulatory Visit: Payer: Self-pay | Admitting: Family Medicine

## 2022-02-12 DIAGNOSIS — I10 Essential (primary) hypertension: Secondary | ICD-10-CM

## 2022-05-23 ENCOUNTER — Encounter: Payer: Self-pay | Admitting: Family Medicine

## 2022-05-23 ENCOUNTER — Ambulatory Visit (INDEPENDENT_AMBULATORY_CARE_PROVIDER_SITE_OTHER): Payer: Medicare HMO | Admitting: Family Medicine

## 2022-05-23 VITALS — BP 138/76 | HR 78 | Temp 98.5°F | Ht 73.0 in | Wt 236.8 lb

## 2022-05-23 DIAGNOSIS — E1169 Type 2 diabetes mellitus with other specified complication: Secondary | ICD-10-CM

## 2022-05-23 DIAGNOSIS — I2510 Atherosclerotic heart disease of native coronary artery without angina pectoris: Secondary | ICD-10-CM | POA: Diagnosis not present

## 2022-05-23 DIAGNOSIS — E782 Mixed hyperlipidemia: Secondary | ICD-10-CM

## 2022-05-23 DIAGNOSIS — E785 Hyperlipidemia, unspecified: Secondary | ICD-10-CM | POA: Diagnosis not present

## 2022-05-23 DIAGNOSIS — I10 Essential (primary) hypertension: Secondary | ICD-10-CM

## 2022-05-23 DIAGNOSIS — Z23 Encounter for immunization: Secondary | ICD-10-CM | POA: Diagnosis not present

## 2022-05-23 LAB — LIPID PANEL
Cholesterol: 181 mg/dL (ref 0–200)
HDL: 55.2 mg/dL (ref >39.00)
LDL Cholesterol: 96 mg/dL (ref 0–99)
NonHDL: 125.99
Total CHOL/HDL Ratio: 3
Triglycerides: 152 mg/dL — ABNORMAL HIGH (ref 0.0–149.0)
VLDL: 30.4 mg/dL (ref 0.0–40.0)

## 2022-05-23 LAB — COMPREHENSIVE METABOLIC PANEL
ALT: 18 U/L (ref 0–53)
AST: 17 U/L (ref 0–37)
Albumin: 4.2 g/dL (ref 3.5–5.2)
Alkaline Phosphatase: 114 U/L (ref 39–117)
BUN: 9 mg/dL (ref 6–23)
CO2: 28 mEq/L (ref 19–32)
Calcium: 9.3 mg/dL (ref 8.4–10.5)
Chloride: 101 mEq/L (ref 96–112)
Creatinine, Ser: 0.62 mg/dL (ref 0.40–1.50)
GFR: 92.4 mL/min (ref >60.00)
Glucose, Bld: 148 mg/dL — ABNORMAL HIGH (ref 70–99)
Potassium: 4.5 mEq/L (ref 3.5–5.1)
Sodium: 139 mEq/L (ref 135–145)
Total Bilirubin: 1 mg/dL (ref 0.2–1.2)
Total Protein: 6.4 g/dL (ref 6.0–8.3)

## 2022-05-23 LAB — HEMOGLOBIN A1C: Hgb A1c MFr Bld: 7 % — ABNORMAL HIGH (ref 4.6–6.5)

## 2022-05-23 MED ORDER — ATORVASTATIN CALCIUM 40 MG PO TABS: 40.0000 mg | ORAL_TABLET | Freq: Every day | ORAL | 2 refills | Status: DC

## 2022-05-23 MED ORDER — METFORMIN HCL 1000 MG PO TABS
1000.0000 mg | ORAL_TABLET | Freq: Two times a day (BID) | ORAL | 1 refills | Status: DC
Start: 2022-05-23 — End: 2022-11-10

## 2022-05-23 MED ORDER — CARVEDILOL 6.25 MG PO TABS: 6.2500 mg | ORAL_TABLET | Freq: Two times a day (BID) | ORAL | 1 refills | Status: DC

## 2022-05-23 MED ORDER — LISINOPRIL 40 MG PO TABS: 40.0000 mg | ORAL_TABLET | Freq: Every day | ORAL | 2 refills | Status: DC

## 2022-05-23 NOTE — Progress Notes (Signed)
Subjective:  Patient ID: Christopher Bauer, male    DOB: 21-Apr-1946  Age: 76 y.o. MRN: [id number]  CC:  Chief Complaint  Patient presents with   Diabetes   Hyperlipidemia   Hypertension    Pt states all is well    HPI Christopher Bauer presents for    Diabetes: Complicated by microalbuminuria, hyperlipidemia.  Treated with metformin 1000 mg twice daily and he is on lisinopril and statin. Home readings: none.  Symptomatic lows, no symptoms.  No new side effects with medications, no GI side effects.  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  Microalbumin: 5.6 on 11/15/2021 on 11/13/2018 Optho, foot exam, pneumovax: Up-to-date except foot exam, completed today. Flu vaccine: today.  COVID-vaccine booster - recommended.  Shingles vaccine: plans at pharmacy.  Taking otc allergy med - cetirizine and tylenol  Diabetic Foot Exam - Simple   Simple Foot Form Visual Inspection No deformities, no ulcerations, no other skin breakdown bilaterally: Yes Sensation Testing Intact to touch and monofilament testing bilaterally: Yes Pulse Check Posterior Tibialis and Dorsalis pulse intact bilaterally: Yes Comments       Lab Results  Component Value Date   HGBA1C 7.2 (A) 11/15/2021   HGBA1C 7.1 (H) 05/28/2021   HGBA1C 7.0 (H) 02/19/2021   Lab Results  Component Value Date   MICROALBUR 5.6 (H) 11/15/2021   LDLCALC 86 11/15/2021   CREATININE 0.63 11/15/2021   Wt Readings from Last 3 Encounters:  05/23/22 236 lb 12.8 oz (107.4 kg)  11/15/21 237 lb (107.5 kg)  05/28/21 242 lb 6.4 oz (110 kg)   Hypertension: With history of coronary artery calcification.  Lisinopril 40 mg daily, carvedilol 6.25 mg twice daily.  Cardiologist Dr. Einar Gip. Home readings: none. No new side effects.  No new sx's. No CP/DOE.  BP Readings from Last 3 Encounters:  05/23/22 138/76  11/15/21 134/66  05/28/21 128/74   Lab Results  Component Value Date   CREATININE 0.63 11/15/2021    Hyperlipidemia: Lipitor 40 mg daily. No new myalgias.  Lab Results  Component Value Date   CHOL 179 11/15/2021   HDL 54.30 11/15/2021   LDLCALC 86 11/15/2021   TRIG 195.0 (H) 11/15/2021   CHOLHDL 3 11/15/2021   Lab Results  Component Value Date   ALT 18 11/15/2021   AST 16 11/15/2021   ALKPHOS 104 11/15/2021   BILITOT 0.8 11/15/2021      History Patient Active Problem List   Diagnosis Date Noted   Basal cell carcinoma of face 12/22/2021   Former smoker 08/24/2016   Coronary artery calcification 02/04/2016   Abnormal liver function test 10/15/2015   Abnormal CXR 05/01/2014   Diabetes mellitus type II 04/13/2012   High cholesterol 04/13/2012   Osteoarthritis of both knees 04/13/2012   Past Medical History:  Diagnosis Date   Arthritis    Diabetes mellitus without complication ([place])    Hyperlipidemia    Hypertension    Prostatitis    Past Surgical History:  Procedure Laterality Date   APPENDECTOMY     Allergies  Allergen Reactions   Sulfa Antibiotics Itching   Prior to Admission medications   Medication Sig Start Date End Date Taking? Authorizing Provider  acetaminophen (TYLENOL) 500 MG tablet Take 500 mg by mouth every 6 (six) hours as needed.   Yes [provider]  aspirin 81 MG tablet Take 81 mg by mouth daily.   Yes [provider]  atorvastatin (LIPITOR) 40 MG tablet Take 1 tablet (40 mg  total) by mouth daily. 11/15/21  Yes Wendie Agreste, MD  carvedilol (COREG) 6.25 MG tablet TAKE 1 TABLET TWICE DAILY  WITH MEALS 02/14/22  Yes Wendie Agreste, MD  cetirizine (ZYRTEC) 10 MG tablet Take 10 mg by mouth daily.   Yes [provider]  lisinopril (ZESTRIL) 40 MG tablet Take 1 tablet (40 mg total) by mouth daily. 11/15/21  Yes Wendie Agreste, MD  metFORMIN (GLUCOPHAGE) 1000 MG tablet Take 1 tablet (1,000 mg total) by mouth 2 (two) times daily with a meal. 11/15/21  Yes Wendie Agreste, MD  doxycycline (VIBRA-TABS) 100 MG tablet TAKE  1 TABLET TWICE A DAY Patient not taking: Reported on 05/23/2022 01/27/22   Wendie Agreste, MD   Social History   Socioeconomic History   Marital status: Divorced    Spouse name: Not on file   Number of children: 2   Years of education: Not on file   Highest education level: High school graduate  Occupational History   Not on file  Tobacco Use   Smoking status: Former    Packs/day: 0.25    Years: 50.00    Total pack years: 12.50    Types: Cigarettes    Quit date: 03/18/2016    Years since quitting: 6.1   Smokeless tobacco: Never  Vaping Use   Vaping Use: Never used  Substance and Sexual Activity   Alcohol use: Yes    Alcohol/week: 0.0 standard drinks of alcohol    Comment: 4 beers once a week    Drug use: No   Sexual activity: Not on file  Other Topics Concern   Not on file  Social History Narrative   Not on file   Social Determinants of Health   Financial Resource Strain: Low Risk  (08/24/2017)   Overall Financial Resource Strain (CARDIA)    Difficulty of Paying Living Expenses: Not hard at all  Food Insecurity: No Food Insecurity (08/24/2017)   Hunger Vital Sign    Worried About Running Out of Food in the Last Year: Never true    Ran Out of Food in the Last Year: Never true  Transportation Needs: No Transportation Needs (08/24/2017)   PRAPARE - Hydrologist (Medical): No    Lack of Transportation (Non-Medical): No  Physical Activity: Inactive (08/24/2017)   Exercise Vital Sign    Days of Exercise per Week: 0 days    Minutes of Exercise per Session: 0 min  Stress: No Stress Concern Present (08/24/2017)   [place]    Feeling of Stress : Not at all  Social Connections: Moderately Isolated (08/24/2017)   Social Connection and Isolation Panel [NHANES]    Frequency of Communication with Friends and Family: More than three times a week    Frequency of Social Gatherings with  Friends and Family: More than three times a week    Attends Religious Services: Never    Marine scientist or Organizations: No    Attends Archivist Meetings: Never    Marital Status: Divorced  Human resources officer Violence: Not At Risk (08/24/2017)   Humiliation, Afraid, Rape, and Kick questionnaire    Fear of Current or Ex-Partner: No    Emotionally Abused: No    Physically Abused: No    Sexually Abused: No   Review of Systems  Constitutional:  Negative for fatigue and unexpected weight change.  Eyes:  Negative for visual disturbance.  Respiratory:  Negative for cough, chest tightness and shortness of breath.   Cardiovascular:  Negative for chest pain, palpitations and leg swelling.  Gastrointestinal:  Negative for abdominal pain and blood in stool.  Neurological:  Negative for dizziness, light-headedness and headaches.     Objective:   Vitals:   05/23/22 0820 05/23/22 0851  BP: (!) 144/70 138/76  Pulse: 78   Temp: 98.5 F (36.9 C)   SpO2: 97%   Weight: 236 lb 12.8 oz (107.4 kg)   Height: '6\' 1"'$  (1.854 m)      Physical Exam Vitals reviewed.  Constitutional:      Appearance: He is well-developed.  HENT:     Head: Normocephalic and atraumatic.  Neck:     Vascular: No carotid bruit or JVD.  Cardiovascular:     Rate and Rhythm: Normal rate and regular rhythm.     Heart sounds: Normal heart sounds. No murmur heard. Pulmonary:     Effort: Pulmonary effort is normal.     Breath sounds: Normal breath sounds. No rales.  Musculoskeletal:     Right lower leg: No edema.     Left lower leg: No edema.  Skin:    General: Skin is warm and dry.  Neurological:     Mental Status: He is alert and oriented to person, place, and time.  Psychiatric:        Mood and Affect: Mood normal.        Assessment & Plan:  Christopher Bauer is a 76 y.o. male . DM type 2 with diabetic mixed hyperlipidemia ([place]) - Plan: metFORMIN (GLUCOPHAGE) 1000 MG tablet, Hemoglobin  A1c  -Tolerating current dose metformin.  Foot exam reassuring.  Check labs, adjust meds accordingly based on results. Flu vaccine today, plans for COVID booster, shingles vaccine at his pharmacy.  Need for influenza vaccination - Plan: Flu Vaccine QUAD High Dose(Fluad)  Hyperlipidemia, unspecified hyperlipidemia type - Plan: atorvastatin (LIPITOR) 40 MG tablet, Comprehensive metabolic panel, Lipid panel  -Tolerating current regimen, check labs with medication adjustments accordingly.  Coronary artery disease involving native heart without angina pectoris, unspecified vessel or lesion type - Plan: atorvastatin (LIPITOR) 40 MG tablet  -Asymptomatic, would like to see LDL below 70.  Labs above.  Cardiology follow-up as needed.  Essential hypertension - Plan: carvedilol (COREG) 6.25 MG tablet, lisinopril (ZESTRIL) 40 MG tablet  -Borderline control.  Improved on recheck.  Handout given on management of hypertension, home readings recommended with RTC precautions if elevated.  Meds ordered this encounter  Medications   atorvastatin (LIPITOR) 40 MG tablet    Sig: Take 1 tablet (40 mg total) by mouth daily.    Dispense:  90 tablet    Refill:  2   carvedilol (COREG) 6.25 MG tablet    Sig: Take 1 tablet (6.25 mg total) by mouth 2 (two) times daily with a meal.    Dispense:  180 tablet    Refill:  1   lisinopril (ZESTRIL) 40 MG tablet    Sig: Take 1 tablet (40 mg total) by mouth daily.    Dispense:  90 tablet    Refill:  2   metFORMIN (GLUCOPHAGE) 1000 MG tablet    Sig: Take 1 tablet (1,000 mg total) by mouth 2 (two) times daily with a meal.    Dispense:  180 tablet    Refill:  1   Patient Instructions  Keep a record of your blood pressures outside of the office and if over 140/90 - return to discuss med  changes. No changes for now. Take care.   Managing Your Hypertension Hypertension, also called high blood pressure, is when the force of the blood pressing against the walls of the  arteries is too strong. Arteries are blood vessels that carry blood from your heart throughout your body. Hypertension forces the heart to work harder to pump blood and may cause the arteries to become narrow or stiff. Understanding blood pressure readings A blood pressure reading includes a higher number over a lower number: The first, or top, number is called the systolic pressure. It is a measure of the pressure in your arteries as your heart beats. The second, or bottom number, is called the diastolic pressure. It is a measure of the pressure in your arteries as the heart relaxes. For most people, a normal blood pressure is below 120/80. Your personal target blood pressure may vary depending on your medical conditions, your age, and other factors. Blood pressure is classified into four stages. Based on your blood pressure reading, your health care provider may use the following stages to determine what type of treatment you need, if any. Systolic pressure and diastolic pressure are measured in a unit called millimeters of mercury (mmHg). Normal Systolic pressure: below 563. Diastolic pressure: below 80. Elevated Systolic pressure: 875-643. Diastolic pressure: below 80. Hypertension stage 1 Systolic pressure: 329-518. Diastolic pressure: 84-16. Hypertension stage 2 Systolic pressure: 606 or above. Diastolic pressure: 90 or above. How can this condition affect me? Managing your hypertension is very important. Over time, hypertension can damage the arteries and decrease blood flow to parts of the body, including the brain, heart, and kidneys. Having untreated or uncontrolled hypertension can lead to: A heart attack. A stroke. A weakened blood vessel (aneurysm). Heart failure. Kidney damage. Eye damage. Memory and concentration problems. Vascular dementia. What actions can I take to manage this condition? Hypertension can be managed by making lifestyle changes and possibly by taking  medicines. Your health care provider will help you make a plan to bring your blood pressure within a normal range. You may be referred for counseling on a healthy diet and physical activity. Nutrition  Eat a diet that is high in fiber and potassium, and low in salt (sodium), added sugar, and fat. An example eating plan is called the DASH diet. DASH stands for Dietary Approaches to Stop Hypertension. To eat this way: Eat plenty of fresh fruits and vegetables. Try to fill one-half of your plate at each meal with fruits and vegetables. Eat whole grains, such as whole-wheat pasta, brown rice, or whole-grain bread. Fill about one-fourth of your plate with whole grains. Eat low-fat dairy products. Avoid fatty cuts of meat, processed or cured meats, and poultry with skin. Fill about one-fourth of your plate with lean proteins such as fish, chicken without skin, beans, eggs, and tofu. Avoid pre-made and processed foods. These tend to be higher in sodium, added sugar, and fat. Reduce your daily sodium intake. Many people with hypertension should eat less than 1,500 mg of sodium a day. Lifestyle  Work with your health care provider to maintain a healthy body weight or to lose weight. Ask what an ideal weight is for you. Get at least 30 minutes of exercise that causes your heart to beat faster (aerobic exercise) most days of the week. Activities may include walking, swimming, or biking. Include exercise to strengthen your muscles (resistance exercise), such as weight lifting, as part of your weekly exercise routine. Try to do these types of exercises  for 30 minutes at least 3 days a week. Do not use any products that contain nicotine or tobacco. These products include cigarettes, chewing tobacco, and vaping devices, such as e-cigarettes. If you need help quitting, ask your health care provider. Control any long-term (chronic) conditions you have, such as high cholesterol or diabetes. Identify your sources of  stress and find ways to manage stress. This may include meditation, deep breathing, or making time for fun activities. Alcohol use Do not drink alcohol if: Your health care provider tells you not to drink. You are pregnant, may be pregnant, or are planning to become pregnant. If you drink alcohol: Limit how much you have to: 0-1 drink a day for women. 0-2 drinks a day for men. Know how much alcohol is in your drink. In the [place] one drink equals one 12 oz bottle of beer (355 mL), one 5 oz glass of wine (148 mL), or one 1 oz glass of hard liquor (44 mL). Medicines Your health care provider may prescribe medicine if lifestyle changes are not enough to get your blood pressure under control and if: Your systolic blood pressure is 130 or higher. Your diastolic blood pressure is 80 or higher. Take medicines only as told by your health care provider. Follow the directions carefully. Blood pressure medicines must be taken as told by your health care provider. The medicine does not work as well when you skip doses. Skipping doses also puts you at risk for problems. Monitoring Before you monitor your blood pressure: Do not smoke, drink caffeinated beverages, or exercise within 30 minutes before taking a measurement. Use the bathroom and empty your bladder (urinate). Sit quietly for at least 5 minutes before taking measurements. Monitor your blood pressure at home as told by your health care provider. To do this: Sit with your back straight and supported. Place your feet flat on the floor. Do not cross your legs. Support your arm on a flat surface, such as a table. Make sure your upper arm is at heart level. Each time you measure, take two or three readings one minute apart and record the results. You may also need to have your blood pressure checked regularly by your health care provider. General information Talk with your health care provider about your diet, exercise habits, and other lifestyle  factors that may be contributing to hypertension. Review all the medicines you take with your health care provider because there may be side effects or interactions. Keep all follow-up visits. Your health care provider can help you create and adjust your plan for managing your high blood pressure. Where to find more information National Heart, Lung, and Blood Institute: https://wilson-eaton.com/ American Heart Association: www.heart.org Contact a health care provider if: You think you are having a reaction to medicines you have taken. You have repeated (recurrent) headaches. You feel dizzy. You have swelling in your ankles. You have trouble with your vision. Get help right away if: You develop a severe headache or confusion. You have unusual weakness or numbness, or you feel faint. You have severe pain in your chest or abdomen. You vomit repeatedly. You have trouble breathing. These symptoms may be an emergency. Get help right away. Call 911. Do not wait to see if the symptoms will go away. Do not drive yourself to the hospital. Summary Hypertension is when the force of blood pumping through your arteries is too strong. If this condition is not controlled, it may put you at risk for serious complications. Your personal  target blood pressure may vary depending on your medical conditions, your age, and other factors. For most people, a normal blood pressure is less than 120/80. Hypertension is managed by lifestyle changes, medicines, or both. Lifestyle changes to help manage hypertension include losing weight, eating a healthy, low-sodium diet, exercising more, stopping smoking, and limiting alcohol. This information is not intended to replace advice given to you by your health care provider. Make sure you discuss any questions you have with your health care provider. Document Revised: 03/11/2021 Document Reviewed: 03/11/2021 Elsevier Patient Education  2023 [place],    Merri Ray, MD [facility] 05/23/22 8:51 AM

## 2022-05-23 NOTE — Patient Instructions (Signed)
Keep a record of your blood pressures outside of the office and if over 140/90 - return to discuss med changes. No changes for now. Take care.   Managing Your Hypertension Hypertension, also called high blood pressure, is when the force of the blood pressing against the walls of the arteries is too strong. Arteries are blood vessels that carry blood from your heart throughout your body. Hypertension forces the heart to work harder to pump blood and may cause the arteries to become narrow or stiff. Understanding blood pressure readings A blood pressure reading includes a higher number over a lower number: The first, or top, number is called the systolic pressure. It is a measure of the pressure in your arteries as your heart beats. The second, or bottom number, is called the diastolic pressure. It is a measure of the pressure in your arteries as the heart relaxes. For most people, a normal blood pressure is below 120/80. Your personal target blood pressure may vary depending on your medical conditions, your age, and other factors. Blood pressure is classified into four stages. Based on your blood pressure reading, your health care provider may use the following stages to determine what type of treatment you need, if any. Systolic pressure and diastolic pressure are measured in a unit called millimeters of mercury (mmHg). Normal Systolic pressure: below 683. Diastolic pressure: below 80. Elevated Systolic pressure: 419-622. Diastolic pressure: below 80. Hypertension stage 1 Systolic pressure: 297-989. Diastolic pressure: 21-19. Hypertension stage 2 Systolic pressure: 417 or above. Diastolic pressure: 90 or above. How can this condition affect me? Managing your hypertension is very important. Over time, hypertension can damage the arteries and decrease blood flow to parts of the body, including the brain, heart, and kidneys. Having untreated or uncontrolled hypertension can lead to: A heart  attack. A stroke. A weakened blood vessel (aneurysm). Heart failure. Kidney damage. Eye damage. Memory and concentration problems. Vascular dementia. What actions can I take to manage this condition? Hypertension can be managed by making lifestyle changes and possibly by taking medicines. Your health care provider will help you make a plan to bring your blood pressure within a normal range. You may be referred for counseling on a healthy diet and physical activity. Nutrition  Eat a diet that is high in fiber and potassium, and low in salt (sodium), added sugar, and fat. An example eating plan is called the DASH diet. DASH stands for Dietary Approaches to Stop Hypertension. To eat this way: Eat plenty of fresh fruits and vegetables. Try to fill one-half of your plate at each meal with fruits and vegetables. Eat whole grains, such as whole-wheat pasta, brown rice, or whole-grain bread. Fill about one-fourth of your plate with whole grains. Eat low-fat dairy products. Avoid fatty cuts of meat, processed or cured meats, and poultry with skin. Fill about one-fourth of your plate with lean proteins such as fish, chicken without skin, beans, eggs, and tofu. Avoid pre-made and processed foods. These tend to be higher in sodium, added sugar, and fat. Reduce your daily sodium intake. Many people with hypertension should eat less than 1,500 mg of sodium a day. Lifestyle  Work with your health care provider to maintain a healthy body weight or to lose weight. Ask what an ideal weight is for you. Get at least 30 minutes of exercise that causes your heart to beat faster (aerobic exercise) most days of the week. Activities may include walking, swimming, or biking. Include exercise to strengthen your muscles (resistance  exercise), such as weight lifting, as part of your weekly exercise routine. Try to do these types of exercises for 30 minutes at least 3 days a week. Do not use any products that contain  nicotine or tobacco. These products include cigarettes, chewing tobacco, and vaping devices, such as e-cigarettes. If you need help quitting, ask your health care provider. Control any long-term (chronic) conditions you have, such as high cholesterol or diabetes. Identify your sources of stress and find ways to manage stress. This may include meditation, deep breathing, or making time for fun activities. Alcohol use Do not drink alcohol if: Your health care provider tells you not to drink. You are pregnant, may be pregnant, or are planning to become pregnant. If you drink alcohol: Limit how much you have to: 0-1 drink a day for women. 0-2 drinks a day for men. Know how much alcohol is in your drink. In the [place] one drink equals one 12 oz bottle of beer (355 mL), one 5 oz glass of wine (148 mL), or one 1 oz glass of hard liquor (44 mL). Medicines Your health care provider may prescribe medicine if lifestyle changes are not enough to get your blood pressure under control and if: Your systolic blood pressure is 130 or higher. Your diastolic blood pressure is 80 or higher. Take medicines only as told by your health care provider. Follow the directions carefully. Blood pressure medicines must be taken as told by your health care provider. The medicine does not work as well when you skip doses. Skipping doses also puts you at risk for problems. Monitoring Before you monitor your blood pressure: Do not smoke, drink caffeinated beverages, or exercise within 30 minutes before taking a measurement. Use the bathroom and empty your bladder (urinate). Sit quietly for at least 5 minutes before taking measurements. Monitor your blood pressure at home as told by your health care provider. To do this: Sit with your back straight and supported. Place your feet flat on the floor. Do not cross your legs. Support your arm on a flat surface, such as a table. Make sure your upper arm is at heart level. Each  time you measure, take two or three readings one minute apart and record the results. You may also need to have your blood pressure checked regularly by your health care provider. General information Talk with your health care provider about your diet, exercise habits, and other lifestyle factors that may be contributing to hypertension. Review all the medicines you take with your health care provider because there may be side effects or interactions. Keep all follow-up visits. Your health care provider can help you create and adjust your plan for managing your high blood pressure. Where to find more information National Heart, Lung, and Blood Institute: https://wilson-eaton.com/ American Heart Association: www.heart.org Contact a health care provider if: You think you are having a reaction to medicines you have taken. You have repeated (recurrent) headaches. You feel dizzy. You have swelling in your ankles. You have trouble with your vision. Get help right away if: You develop a severe headache or confusion. You have unusual weakness or numbness, or you feel faint. You have severe pain in your chest or abdomen. You vomit repeatedly. You have trouble breathing. These symptoms may be an emergency. Get help right away. Call 911. Do not wait to see if the symptoms will go away. Do not drive yourself to the hospital. Summary Hypertension is when the force of blood pumping through your arteries is  too strong. If this condition is not controlled, it may put you at risk for serious complications. Your personal target blood pressure may vary depending on your medical conditions, your age, and other factors. For most people, a normal blood pressure is less than 120/80. Hypertension is managed by lifestyle changes, medicines, or both. Lifestyle changes to help manage hypertension include losing weight, eating a healthy, low-sodium diet, exercising more, stopping smoking, and limiting alcohol. This  information is not intended to replace advice given to you by your health care provider. Make sure you discuss any questions you have with your health care provider. Document Revised: 03/11/2021 Document Reviewed: 03/11/2021 Elsevier Patient Education  [place].

## 2022-05-25 ENCOUNTER — Encounter: Payer: Self-pay | Admitting: Family Medicine

## 2022-05-25 NOTE — Telephone Encounter (Signed)
I guess this is a sort of FYI from the patient

## 2022-06-01 ENCOUNTER — Ambulatory Visit (INDEPENDENT_AMBULATORY_CARE_PROVIDER_SITE_OTHER): Payer: Medicare HMO | Admitting: *Deleted

## 2022-06-01 DIAGNOSIS — Z0000 Encounter for general adult medical examination without abnormal findings: Secondary | ICD-10-CM

## 2022-06-01 NOTE — Patient Instructions (Signed)
Christopher Bauer , Thank you for taking time to come for your Medicare Wellness Visit. I appreciate your ongoing commitment to your health goals. Please review the following plan we discussed and let me know if I can assist you in the future.   These are the goals we discussed:  Goals      Weight (lb) < 200 lb (90.7 kg)     Keeping A1c down.  Keeping weight at 220lb     Weight (lb) < 200 lb (90.7 kg)     Weight (lb) < 220 lb (99.8 kg)     Keeping A1c down.  Keeping weight at 220lb        This is a list of the screening recommended for you and due dates:  Health Maintenance  Topic Date Due   COVID-19 Vaccine (4 - 2023-24 season) 06/08/2022*   Zoster (Shingles) Vaccine (1 of 2) 08/23/2022*   Eye exam for diabetics  06/10/2022   Yearly kidney health urinalysis for diabetes  11/16/2022   Hemoglobin A1C  11/21/2022   Yearly kidney function blood test for diabetes  05/24/2023   Complete foot exam   05/24/2023   Medicare Annual Wellness Visit  06/02/2023   Pneumonia Vaccine  Completed   Flu Shot  Completed   Hepatitis C Screening: USPSTF Recommendation to screen - Ages 18-79 yo.  Completed   HPV Vaccine  Aged Out   Cologuard (Stool DNA test)  Discontinued  *Topic was postponed. The date shown is not the original due date.    Advanced directives: in process   Conditions/risks identified:   Next appointment: Follow up in one year for your annual wellness visit. 12-11-2021 @ 8:00  [name] 65 Years and Older, Male  Preventive care refers to lifestyle choices and visits with your health care provider that can promote health and wellness. What does preventive care include? A yearly physical exam. This is also called an annual well check. Dental exams once or twice a year. Routine eye exams. Ask your health care provider how often you should have your eyes checked. Personal lifestyle choices, including: Daily care of your teeth and gums. Regular physical activity. Eating a  healthy diet. Avoiding tobacco and drug use. Limiting alcohol use. Practicing safe sex. Taking low doses of aspirin every day. Taking vitamin and mineral supplements as recommended by your health care provider. What happens during an annual well check? The services and screenings done by your health care provider during your annual well check will depend on your age, overall health, lifestyle risk factors, and family history of disease. Counseling  Your health care provider may ask you questions about your: Alcohol use. Tobacco use. Drug use. Emotional well-being. Home and relationship well-being. Sexual activity. Eating habits. History of falls. Memory and ability to understand (cognition). Work and work Statistician. Screening  You may have the following tests or measurements: Height, weight, and BMI. Blood pressure. Lipid and cholesterol levels. These may be checked every 5 years, or more frequently if you are over 72 years old. Skin check. Lung cancer screening. You may have this screening every year starting at age 50 if you have a 30-pack-year history of smoking and currently smoke or have quit within the past 15 years. Fecal occult blood test (FOBT) of the stool. You may have this test every year starting at age 40. Flexible sigmoidoscopy or colonoscopy. You may have a sigmoidoscopy every 5 years or a colonoscopy every 10 years starting at age 73.  Prostate cancer screening. Recommendations will vary depending on your family history and other risks. Hepatitis C blood test. Hepatitis B blood test. Sexually transmitted disease (STD) testing. Diabetes screening. This is done by checking your blood sugar (glucose) after you have not eaten for a while (fasting). You may have this done every 1-3 years. Abdominal aortic aneurysm (AAA) screening. You may need this if you are a current or former smoker. Osteoporosis. You may be screened starting at age 77 if you are at high risk. Talk  with your health care provider about your test results, treatment options, and if necessary, the need for more tests. Vaccines  Your health care provider may recommend certain vaccines, such as: Influenza vaccine. This is recommended every year. Tetanus, diphtheria, and acellular pertussis (Tdap, Td) vaccine. You may need a Td booster every 10 years. Zoster vaccine. You may need this after age 54. Pneumococcal 13-valent conjugate (PCV13) vaccine. One dose is recommended after age 82. Pneumococcal polysaccharide (PPSV23) vaccine. One dose is recommended after age 60. Talk to your health care provider about which screenings and vaccines you need and how often you need them. This information is not intended to replace advice given to you by your health care provider. Make sure you discuss any questions you have with your health care provider. Document Released: 07/24/2015 Document Revised: 03/16/2016 Document Reviewed: 04/28/2015 Elsevier Interactive Patient Education  2017 [place] Prevention in the Home Falls can cause injuries. They can happen to people of all ages. There are many things you can do to make your home safe and to help prevent falls. What can I do on the outside of my home? Regularly fix the edges of walkways and driveways and fix any cracks. Remove anything that might make you trip as you walk through a door, such as a raised step or threshold. Trim any bushes or trees on the path to your home. Use bright outdoor lighting. Clear any walking paths of anything that might make someone trip, such as rocks or tools. Regularly check to see if handrails are loose or broken. Make sure that both sides of any steps have handrails. Any raised decks and porches should have guardrails on the edges. Have any leaves, snow, or ice cleared regularly. Use sand or salt on walking paths during winter. Clean up any spills in your garage right away. This includes oil or grease  spills. What can I do in the bathroom? Use night lights. Install grab bars by the toilet and in the tub and shower. Do not use towel bars as grab bars. Use non-skid mats or decals in the tub or shower. If you need to sit down in the shower, use a plastic, non-slip stool. Keep the floor dry. Clean up any water that spills on the floor as soon as it happens. Remove soap buildup in the tub or shower regularly. Attach bath mats securely with double-sided non-slip rug tape. Do not have throw rugs and other things on the floor that can make you trip. What can I do in the bedroom? Use night lights. Make sure that you have a light by your bed that is easy to reach. Do not use any sheets or blankets that are too big for your bed. They should not hang down onto the floor. Have a firm chair that has side arms. You can use this for support while you get dressed. Do not have throw rugs and other things on the floor that can make you trip.  What can I do in the kitchen? Clean up any spills right away. Avoid walking on wet floors. Keep items that you use a lot in easy-to-reach places. If you need to reach something above you, use a strong step stool that has a grab bar. Keep electrical cords out of the way. Do not use floor polish or wax that makes floors slippery. If you must use wax, use non-skid floor wax. Do not have throw rugs and other things on the floor that can make you trip. What can I do with my stairs? Do not leave any items on the stairs. Make sure that there are handrails on both sides of the stairs and use them. Fix handrails that are broken or loose. Make sure that handrails are as long as the stairways. Check any carpeting to make sure that it is firmly attached to the stairs. Fix any carpet that is loose or worn. Avoid having throw rugs at the top or bottom of the stairs. If you do have throw rugs, attach them to the floor with carpet tape. Make sure that you have a light switch at the  top of the stairs and the bottom of the stairs. If you do not have them, ask someone to add them for you. What else can I do to help prevent falls? Wear shoes that: Do not have high heels. Have rubber bottoms. Are comfortable and fit you well. Are closed at the toe. Do not wear sandals. If you use a stepladder: Make sure that it is fully opened. Do not climb a closed stepladder. Make sure that both sides of the stepladder are locked into place. Ask someone to hold it for you, if possible. Clearly mark and make sure that you can see: Any grab bars or handrails. First and last steps. Where the edge of each step is. Use tools that help you move around (mobility aids) if they are needed. These include: Canes. Walkers. Scooters. Crutches. Turn on the lights when you go into a dark area. Replace any light bulbs as soon as they burn out. Set up your furniture so you have a clear path. Avoid moving your furniture around. If any of your floors are uneven, fix them. If there are any pets around you, be aware of where they are. Review your medicines with your doctor. Some medicines can make you feel dizzy. This can increase your chance of falling. Ask your doctor what other things that you can do to help prevent falls. This information is not intended to replace advice given to you by your health care provider. Make sure you discuss any questions you have with your health care provider. Document Released: 04/23/2009 Document Revised: 12/03/2015 Document Reviewed: 08/01/2014 Elsevier Interactive Patient Education  2017 [employer].

## 2022-06-01 NOTE — Progress Notes (Signed)
Subjective:   Christopher Bauer is a 76 y.o. male who presents for Medicare Annual/Subsequent preventive examination.  I connected with  Christopher Bauer on 06/01/22 by a  telephone enabled telemedicine application and verified that I am speaking with the correct person using two identifiers.   I discussed the limitations of evaluation and management by telemedicine. The patient expressed understanding and agreed to proceed.  Patient location: home  Provider location: in office    Review of Systems     Cardiac Risk Factors include: advanced age (>70mn, >>55women);diabetes mellitus;obesity (BMI >30kg/m2);family history of premature cardiovascular disease     Objective:    Today's Vitals   There is no height or weight on file to calculate BMI.     06/01/2022    8:29 AM 03/17/2020    1:47 PM 03/22/2019    3:49 PM 03/15/2019    8:05 AM 08/24/2017    9:24 AM  Advanced Directives  Does Patient Have a Medical Advance Directive? No Yes No No No  Type of Advance Directive  [place]    Does patient want to make changes to medical advance directive?  No - Patient declined     Copy of [place]?  No - copy requested     Would patient like information on creating a medical advance directive? No - Patient declined  No - Patient declined No - Patient declined Yes (MAU/Ambulatory/Procedural Areas - Information given)    Current Medications (verified) Outpatient Encounter Medications as of 06/01/2022  Medication Sig   acetaminophen (TYLENOL) 500 MG tablet Take 500 mg by mouth every 6 (six) hours as needed.   aspirin 81 MG tablet Take 81 mg by mouth daily.   atorvastatin (LIPITOR) 40 MG tablet Take 1 tablet (40 mg total) by mouth daily.   carvedilol (COREG) 6.25 MG tablet Take 1 tablet (6.25 mg total) by mouth 2 (two) times daily with a meal.   cetirizine (ZYRTEC) 10 MG tablet Take 10 mg by mouth daily.   lisinopril (ZESTRIL) 40 MG tablet Take 1  tablet (40 mg total) by mouth daily.   metFORMIN (GLUCOPHAGE) 1000 MG tablet Take 1 tablet (1,000 mg total) by mouth 2 (two) times daily with a meal.   doxycycline (VIBRA-TABS) 100 MG tablet TAKE 1 TABLET TWICE A DAY (Patient not taking: Reported on 05/23/2022)   No facility-administered encounter medications on file as of 06/01/2022.    Allergies (verified) Sulfa antibiotics   History: Past Medical History:  Diagnosis Date   Arthritis    Diabetes mellitus without complication [place]    Hyperlipidemia    Hypertension    Prostatitis    Past Surgical History:  Procedure Laterality Date   APPENDECTOMY     Family History  Problem Relation Age of Onset   Heart disease Mother    Social History   Socioeconomic History   Marital status: Divorced    Spouse name: Not on file   Number of children: 2   Years of education: Not on file   Highest education level: High school graduate  Occupational History   Not on file  Tobacco Use   Smoking status: Former    Packs/day: 0.25    Years: 50.00    Total pack years: 12.50    Types: Cigarettes    Quit date: 03/18/2016    Years since quitting: 6.2   Smokeless tobacco: Never  Vaping Use   Vaping Use: Never used  Substance  and Sexual Activity   Alcohol use: Yes    Alcohol/week: 0.0 standard drinks of alcohol    Comment: 4 beers once a week    Drug use: No   Sexual activity: Not on file  Other Topics Concern   Not on file  Social History Narrative   Not on file   Social Determinants of Health   Financial Resource Strain: Low Risk  (06/01/2022)   Overall Financial Resource Strain (CARDIA)    Difficulty of Paying Living Expenses: Not hard at all  Food Insecurity: No Food Insecurity (06/01/2022)   Hunger Vital Sign    Worried About Running Out of Food in the Last Year: Never true    Ran Out of Food in the Last Year: Never true  Transportation Needs: No Transportation Needs (06/01/2022)   PRAPARE - Radiographer, therapeutic (Medical): No    Lack of Transportation (Non-Medical): No  Physical Activity: Inactive (06/01/2022)   Exercise Vital Sign    Days of Exercise per Week: 0 days    Minutes of Exercise per Session: 0 min  Stress: No Stress Concern Present (06/01/2022)   [place]    Feeling of Stress : Not at all  Social Connections: Moderately Isolated (06/01/2022)   Social Connection and Isolation Panel [NHANES]    Frequency of Communication with Friends and Family: Twice a week    Frequency of Social Gatherings with Friends and Family: More than three times a week    Attends Religious Services: Never    Marine scientist or Organizations: No    Attends Music therapist: Never    Marital Status: Married    Tobacco Counseling Counseling given: Not Answered   Clinical Intake:  Pre-visit preparation completed: Yes  Pain : No/denies pain     Nutritional Risks: None Diabetes: Yes CBG done?: No Did pt. bring in CBG monitor from home?: No  How often do you need to have someone help you when you read instructions, pamphlets, or other written materials from your doctor or pharmacy?: 1 - Never  Diabetic?  Yes  Nutrition Risk Assessment:  Has the patient had any N/V/D within the last 2 months?  No  Does the patient have any non-healing wounds?  No  Has the patient had any unintentional weight loss or weight gain?  No   Diabetes:  Is the patient diabetic?  Yes  If diabetic, was a CBG obtained today?  No  Did the patient bring in their glucometer from home?  No  How often do you monitor your CBG's? Does not check.   Financial Strains and Diabetes Management:  Are you having any financial strains with the device, your supplies or your medication? No .  Does the patient want to be seen by Chronic Care Management for management of their diabetes?  No  Would the patient like to be referred to a  Nutritionist or for Diabetic Management?  No   Diabetic Exams:  Diabetic Eye Exam: Completed . Pt has been advised about the importance in completing this exam.   Diabetic Foot Exam:  Pt has been advised about the importance in completing this exam.       Information entered by :: Leroy Kennedy LPN   Activities of Daily Living    06/01/2022    8:33 AM 05/25/2022   11:03 AM  In your present state of health, do you have any difficulty performing  the following activities:  Hearing? 0 0  Vision? 0 0  Difficulty concentrating or making decisions? 0 0  Walking or climbing stairs? 0 0  Dressing or bathing? 0 0  Doing errands, shopping? 0 0  Preparing Food and eating ? N N  Using the Toilet? N N  In the past six months, have you accidently leaked urine? N N  Do you have problems with loss of bowel control? N N  Managing your Medications? N N  Managing your Finances? N N  Housekeeping or managing your Housekeeping? N N    Patient Care Team: Wendie Agreste, MD as PCP - General (Family Medicine) Adrian Prows, MD as Consulting Physician (Cardiology) Warden Fillers, MD as Consulting Physician (Ophthalmology)  Indicate any recent Medical Services you may have received from other than [facility] providers in the past year (date may be approximate).     Assessment:   This is a routine wellness examination for [place].  Hearing/Vision screen Hearing Screening - Comments:: No trouble hearing Vision Screening - Comments:: [facility] Up to date  Dietary issues and exercise activities discussed: Current Exercise Habits: The patient does not participate in regular exercise at present   Goals Addressed             This Visit's Progress    Weight (lb) < 200 lb (90.7 kg)         Depression Screen    06/01/2022    8:35 AM 05/23/2022    8:19 AM 11/15/2021    8:37 AM 05/28/2021    8:52 AM 02/19/2021    9:44 AM 02/19/2021    9:43 AM 11/12/2020    7:56 AM  PHQ 2/9 Scores  PHQ - 2 Score 0  0 0 0 0 0 0  PHQ- 9 Score 0 0 0  1      Fall Risk    05/25/2022   11:03 AM 05/23/2022    8:19 AM 11/15/2021    8:36 AM 05/28/2021    8:52 AM 02/19/2021    9:43 AM  Fall Risk   Falls in the past year? 0 0 0 0 0  Number falls in past yr:  0 0 0 0  Injury with Fall?  0 0 0 0  Risk for fall due to :  No Fall Risks No Fall Risks No Fall Risks No Fall Risks  Follow up  Falls evaluation completed Falls evaluation completed Falls evaluation completed Falls evaluation completed    [place]:  Any stairs in or around the home? No  If so, are there any without handrails? No  Home free of loose throw rugs in walkways, pet beds, electrical cords, etc? Yes  Adequate lighting in your home to reduce risk of falls? Yes   ASSISTIVE DEVICES UTILIZED TO PREVENT FALLS:  Life alert? No  Use of a cane, walker or w/c? No  Grab bars in the bathroom? No  Shower chair or bench in shower? No  Elevated toilet seat or a handicapped toilet? Yes   TIMED UP AND GO:  Was the test performed? No .    Cognitive Function:        06/01/2022    8:32 AM 03/17/2020    1:45 PM 03/15/2019    8:05 AM 08/24/2017    9:29 AM  6CIT Screen  What Year? 0 points 0 points 0 points 0 points  What month? 0 points 0 points 0 points 0 points  What time?  0 points 0 points 0 points 0 points  Count back from 20 0 points 0 points 0 points 0 points  Months in reverse 0 points 2 points 0 points 4 points  Repeat phrase 0 points 0 points 0 points 2 points  Total Score 0 points 2 points 0 points 6 points    Immunizations Immunization History  Administered Date(s) Administered   Fluad Quad(high Dose 65+) 03/01/2019, 05/28/2021, 05/23/2022   Influenza Split 04/12/2013   Influenza,inj,Quad PF,6+ Mos 05/25/2016, 02/26/2020   Influenza-Unspecified 04/10/2014, 04/20/2015, 05/11/2018   Moderna Sars-Covid-2 Vaccination 08/26/2019, 09/24/2019   PFIZER(Purple Top)SARS-COV-2 Vaccination 05/05/2020    Pneumococcal Conjugate-13 05/01/2014   Pneumococcal Polysaccharide-23 02/04/2016   Tdap 02/15/2017    TDAP status: Up to date  Flu Vaccine status: Up to date  Pneumococcal vaccine status: Up to date  Covid-19 vaccine status: Information provided on how to obtain vaccines.   Qualifies for Shingles Vaccine? Yes   Zostavax completed No   Shingrix Completed?: No.    Education has been provided regarding the importance of this vaccine. Patient has been advised to call insurance company to determine out of pocket expense if they have not yet received this vaccine. Advised may also receive vaccine at local pharmacy or Health Dept. Verbalized acceptance and understanding.  Screening Tests Health Maintenance  Topic Date Due   COVID-19 Vaccine (4 - 2023-24 season) 06/08/2022 (Originally 03/11/2022)   Zoster Vaccines- Shingrix (1 of 2) 08/23/2022 (Originally 03/16/1965)   OPHTHALMOLOGY EXAM  06/10/2022   Diabetic kidney evaluation - Urine ACR  11/16/2022   HEMOGLOBIN A1C  11/21/2022   Diabetic kidney evaluation - GFR measurement  05/24/2023   FOOT EXAM  05/24/2023   Medicare Annual Wellness (AWV)  06/02/2023   Pneumonia Vaccine 93+ Years old  Completed   INFLUENZA VACCINE  Completed   Hepatitis C Screening  Completed   HPV VACCINES  Aged Out   Fecal DNA (Cologuard)  Discontinued    Health Maintenance  There are no preventive care reminders to display for this patient.   Colorectal cancer screening: No longer required.   Lung Cancer Screening: (Low Dose CT Chest recommended if Age 71-80 years, 30 pack-year currently smoking OR have quit w/in 15years.) does not qualify.   Lung Cancer Screening Referral:   Additional Screening:  Hepatitis C Screening: does not qualify; Completed 2017  Vision Screening: Recommended annual ophthalmology exams for early detection of glaucoma and other disorders of the eye. Is the patient up to date with their annual eye exam?  Yes  Who is the provider  or what is the name of the office in which the patient attends annual eye exams? [facility] If pt is not established with a provider, would they like to be referred to a provider to establish care? No .   Dental Screening: Recommended annual dental exams for proper oral hygiene  Community Resource Referral / Chronic Care Management: CRR required this visit?  No   CCM required this visit?  No      Plan:     I have personally reviewed and noted the following in the patient's chart:   Medical and social history Use of alcohol, tobacco or illicit drugs  Current medications and supplements including opioid prescriptions. Patient is not currently taking opioid prescriptions. Functional ability and status Nutritional status Physical activity Advanced directives List of other physicians Hospitalizations, surgeries, and ER visits in previous 12 months Vitals Screenings to include cognitive, depression, and falls Referrals and appointments  In addition,  I have reviewed and discussed with patient certain preventive protocols, quality metrics, and best practice recommendations. A written personalized care plan for preventive services as well as general preventive health recommendations were provided to patient.     Leroy Kennedy, LPN   [id number]/[id number]   Nurse Notes:

## 2022-09-19 DIAGNOSIS — L03019 Cellulitis of unspecified finger: Secondary | ICD-10-CM | POA: Diagnosis not present

## 2022-09-19 DIAGNOSIS — Z6832 Body mass index (BMI) 32.0-32.9, adult: Secondary | ICD-10-CM | POA: Diagnosis not present

## 2022-10-27 DIAGNOSIS — Z961 Presence of intraocular lens: Secondary | ICD-10-CM | POA: Diagnosis not present

## 2022-10-27 DIAGNOSIS — E119 Type 2 diabetes mellitus without complications: Secondary | ICD-10-CM | POA: Diagnosis not present

## 2022-10-27 LAB — HM DIABETES EYE EXAM

## 2022-11-10 ENCOUNTER — Other Ambulatory Visit: Payer: Self-pay | Admitting: Family Medicine

## 2022-11-10 DIAGNOSIS — E1169 Type 2 diabetes mellitus with other specified complication: Secondary | ICD-10-CM

## 2022-11-10 DIAGNOSIS — I10 Essential (primary) hypertension: Secondary | ICD-10-CM

## 2022-12-12 ENCOUNTER — Encounter: Payer: Medicare HMO | Admitting: Family Medicine

## 2022-12-14 ENCOUNTER — Encounter: Payer: Self-pay | Admitting: Family Medicine

## 2022-12-14 ENCOUNTER — Ambulatory Visit (INDEPENDENT_AMBULATORY_CARE_PROVIDER_SITE_OTHER): Payer: Medicare HMO | Admitting: Family Medicine

## 2022-12-14 VITALS — BP 136/74 | HR 83 | Temp 98.0°F | Ht 72.0 in | Wt 232.8 lb

## 2022-12-14 DIAGNOSIS — E785 Hyperlipidemia, unspecified: Secondary | ICD-10-CM | POA: Diagnosis not present

## 2022-12-14 DIAGNOSIS — E1169 Type 2 diabetes mellitus with other specified complication: Secondary | ICD-10-CM | POA: Diagnosis not present

## 2022-12-14 DIAGNOSIS — Z0000 Encounter for general adult medical examination without abnormal findings: Secondary | ICD-10-CM

## 2022-12-14 DIAGNOSIS — E782 Mixed hyperlipidemia: Secondary | ICD-10-CM | POA: Diagnosis not present

## 2022-12-14 DIAGNOSIS — I10 Essential (primary) hypertension: Secondary | ICD-10-CM

## 2022-12-14 DIAGNOSIS — I2510 Atherosclerotic heart disease of native coronary artery without angina pectoris: Secondary | ICD-10-CM

## 2022-12-14 DIAGNOSIS — Z7984 Long term (current) use of oral hypoglycemic drugs: Secondary | ICD-10-CM

## 2022-12-14 DIAGNOSIS — Z23 Encounter for immunization: Secondary | ICD-10-CM

## 2022-12-14 MED ORDER — CARVEDILOL 6.25 MG PO TABS: 6.2500 mg | ORAL_TABLET | Freq: Two times a day (BID) | ORAL | 2 refills | Status: DC

## 2022-12-14 MED ORDER — ATORVASTATIN CALCIUM 40 MG PO TABS: 40.0000 mg | ORAL_TABLET | Freq: Every day | ORAL | 2 refills | Status: DC

## 2022-12-14 MED ORDER — LISINOPRIL 40 MG PO TABS: 40.0000 mg | ORAL_TABLET | Freq: Every day | ORAL | 2 refills | Status: DC

## 2022-12-14 MED ORDER — METFORMIN HCL 1000 MG PO TABS: 1000.0000 mg | ORAL_TABLET | Freq: Two times a day (BID) | ORAL | 2 refills | Status: DC

## 2022-12-14 NOTE — Patient Instructions (Signed)
Pneumonia vaccine updated today.  Flu vaccine in the fall and there should be a new COVID booster at that time as well that I would recommend.  Also recommend dental evaluation at some point.  Continue follow-up with your eye and other specialists as planned.  I did refill your medications and check labs today.  I will let you know if there are concerns.  No med changes at this time.  Glad you are doing well and take care!  Preventive Care 54 Years and Older, Male Preventive care refers to lifestyle choices and visits with your health care provider that can promote health and wellness. Preventive care visits are also called wellness exams. What can I expect for my preventive care visit? Counseling During your preventive care visit, your health care provider may ask about your: Medical history, including: Past medical problems. Family medical history. History of falls. Current health, including: Emotional well-being. Home life and relationship well-being. Sexual activity. Memory and ability to understand (cognition). Lifestyle, including: Alcohol, nicotine or tobacco, and drug use. Access to firearms. Diet, exercise, and sleep habits. Work and work Astronomer. Sunscreen use. Safety issues such as seatbelt and bike helmet use. Physical exam Your health care provider will check your: Height and weight. These may be used to calculate your BMI (body mass index). BMI is a measurement that tells if you are at a healthy weight. Waist circumference. This measures the distance around your waistline. This measurement also tells if you are at a healthy weight and may help predict your risk of certain diseases, such as type 2 diabetes and high blood pressure. Heart rate and blood pressure. Body temperature. Skin for abnormal spots. What immunizations do I need?  Vaccines are usually given at various ages, according to a schedule. Your health care provider will recommend vaccines for you based on  your age, medical history, and lifestyle or other factors, such as travel or where you work. What tests do I need? Screening Your health care provider may recommend screening tests for certain conditions. This may include: Lipid and cholesterol levels. Diabetes screening. This is done by checking your blood sugar (glucose) after you have not eaten for a while (fasting). Hepatitis C test. Hepatitis B test. HIV (human immunodeficiency virus) test. STI (sexually transmitted infection) testing, if you are at risk. Lung cancer screening. Colorectal cancer screening. Prostate cancer screening. Abdominal aortic aneurysm (AAA) screening. You may need this if you are a current or former smoker. Talk with your health care provider about your test results, treatment options, and if necessary, the need for more tests. Follow these instructions at home: Eating and drinking  Eat a diet that includes fresh fruits and vegetables, whole grains, lean protein, and low-fat dairy products. Limit your intake of foods with high amounts of sugar, saturated fats, and salt. Take vitamin and mineral supplements as recommended by your health care provider. Do not drink alcohol if your health care provider tells you not to drink. If you drink alcohol: Limit how much you have to 0-2 drinks a day. Know how much alcohol is in your drink. In the [place] one drink equals one 12 oz bottle of beer (355 mL), one 5 oz glass of wine (148 mL), or one 1 oz glass of hard liquor (44 mL). Lifestyle Brush your teeth every morning and night with fluoride toothpaste. Floss one time each day. Exercise for at least 30 minutes 5 or more days each week. Do not use any products that contain nicotine or  tobacco. These products include cigarettes, chewing tobacco, and vaping devices, such as e-cigarettes. If you need help quitting, ask your health care provider. Do not use drugs. If you are sexually active, practice safe sex. Use a condom or  other form of protection to prevent STIs. Take aspirin only as told by your health care provider. Make sure that you understand how much to take and what form to take. Work with your health care provider to find out whether it is safe and beneficial for you to take aspirin daily. Ask your health care provider if you need to take a cholesterol-lowering medicine (statin). Find healthy ways to manage stress, such as: Meditation, yoga, or listening to music. Journaling. Talking to a trusted person. Spending time with friends and family. Safety Always wear your seat belt while driving or riding in a vehicle. Do not drive: If you have been drinking alcohol. Do not ride with someone who has been drinking. When you are tired or distracted. While texting. If you have been using any mind-altering substances or drugs. Wear a helmet and other protective equipment during sports activities. If you have firearms in your house, make sure you follow all gun safety procedures. Minimize exposure to UV radiation to reduce your risk of skin cancer. What's next? Visit your health care provider once a year for an annual wellness visit. Ask your health care provider how often you should have your eyes and teeth checked. Stay up to date on all vaccines. This information is not intended to replace advice given to you by your health care provider. Make sure you discuss any questions you have with your health care provider. Document Revised: 12/23/2020 Document Reviewed: 12/23/2020 Elsevier Patient Education  2024 [employer].

## 2022-12-14 NOTE — Progress Notes (Signed)
Subjective:  Patient ID: Christopher Bauer, male    DOB: [birth year]  Age: 77 y.o. MRN: [id number]  CC:  Chief Complaint  Patient presents with   Medical Management of Chronic Issues    Pt due for annual check in     HPI Christopher Bauer presents for Annual Exam Wellness exam in November. Cardiology, Dr. Jacinto Halim Dermatology, Dr. Doreen Beam, recently Dr. Irene Limbo basal cell carcinoma of nose Ortho, Dr. Ranell Patrick, knee osteoarthritis, left Ophthalmology, Dr. Fabian Sharp Hand/Ortho, Dr. Melvyn Novas   Diabetes: Complicated by microalbuminuria, hyperlipidemia treated with metformin 1000 mg twice daily, is on ACE inhibitor with lisinopril and statin. No new side effects. Occasional diarrhea in past - thought to be due to diet. Once per 2 weeks - eggs.  Home readings: none No symptoms of low blood sugars.   Microalbumin: 5.6 on 11/15/2021. Optho, foot exam, pneumovax:  Ophthalmology visit April 18. Due for microalbumin, foot exam. Lab Results  Component Value Date   HGBA1C 7.0 (H) 05/23/2022   HGBA1C 7.2 (A) 11/15/2021   HGBA1C 7.1 (H) 05/28/2021   Lab Results  Component Value Date   MICROALBUR 5.6 (H) 11/15/2021   LDLCALC 96 05/23/2022   CREATININE 0.62 05/23/2022   Hypertension: With history of coronary artery calcification, lisinopril 40 mg daily, carvedilol 6.25 mg twice daily, cardiologist Dr. Jacinto Halim. - prn follow up.  Still on ASA 81 mg qd. No new bleeding No chest pain, dyspnea on exertion or new symptoms/side effects with medications. Home readings: none.  BP Readings from Last 3 Encounters:  12/14/22 136/74  05/23/22 138/76  11/15/21 134/66   Lab Results  Component Value Date   CREATININE 0.62 05/23/2022   Hyperlipidemia: Lipitor 40 mg daily, without new myalgias.  Lab Results  Component Value Date   CHOL 181 05/23/2022   HDL 55.20 05/23/2022   LDLCALC 96 05/23/2022   TRIG 152.0 (H) 05/23/2022   CHOLHDL 3 05/23/2022   Lab Results  Component Value Date   ALT 18 05/23/2022    AST 17 05/23/2022   ALKPHOS 114 05/23/2022   BILITOT 1.0 05/23/2022          12/14/2022    2:06 PM 06/01/2022    8:35 AM 05/23/2022    8:19 AM 11/15/2021    8:37 AM 05/28/2021    8:52 AM  Depression screen PHQ 2/9  Decreased Interest 0 0 0 0 0  Down, Depressed, Hopeless 0 0 0 0 0  PHQ - 2 Score 0 0 0 0 0  Altered sleeping 0 0 0 0   Tired, decreased energy 0 0 0 0   Change in appetite 0 0 0 0   Feeling bad or failure about yourself  0 0 0 0   Trouble concentrating 0 0 0 0   Moving slowly or fidgety/restless 0 0 0 0   Suicidal thoughts 0 0 0 0   PHQ-9 Score 0 0 0 0     Health Maintenance  Topic Date Due   Zoster Vaccines- Shingrix (1 of 2) Never done   COVID-19 Vaccine (4 - 2023-24 season) 03/11/2022   Diabetic kidney evaluation - Urine ACR  11/16/2022   HEMOGLOBIN A1C  11/21/2022   INFLUENZA VACCINE  02/09/2023   Diabetic kidney evaluation - eGFR measurement  05/24/2023   FOOT EXAM  05/24/2023   Medicare Annual Wellness (AWV)  06/02/2023   OPHTHALMOLOGY EXAM  10/27/2023   DTaP/Tdap/Td (2 - Td or Tdap) 02/16/2027   Pneumonia Vaccine 63+ Years old  Completed  Hepatitis C Screening  Completed   HPV VACCINES  Aged Out   Fecal DNA (Cologuard)  Discontinued  Cologuard negative in 2020 - declines further testing.  Prostate: The natural history of prostate cancer and ongoing controversy regarding screening and potential treatment outcomes of prostate cancer has been discussed with the patient. The meaning of a false positive PSA and a false negative PSA has been discussed as well as risks with age. He indicates understanding of the limitations of this screening test and wishes NOT to proceed with screening PSA testing.  Immunization History  Administered Date(s) Administered   Fluad Quad(high Dose 65+) 03/01/2019, 05/28/2021, 05/23/2022   Influenza Split 04/12/2013   Influenza,inj,Quad PF,6+ Mos 05/25/2016, 02/26/2020   Influenza-Unspecified 04/10/2014, 04/20/2015,  05/11/2018   Moderna Sars-Covid-2 Vaccination 08/26/2019, 09/24/2019   PFIZER(Purple Top)SARS-COV-2 Vaccination 05/05/2020   Pneumococcal Conjugate-13 05/01/2014   Pneumococcal Polysaccharide-23 02/04/2016   Tdap 02/15/2017  Shingrix - declines.  Decided against covid booster - option of new booster and flu vaccine recommended in the fall.   No results found. Recent optho eval.   Dental: dentures and natural lower. Recommended dental visit.   Alcohol: up to 8 beers per week.   Tobacco: none. Quit 7 yrs ago.   Exercise: part time job with walking. Arthritis limiting golf.    History Patient Active Problem List   Diagnosis Date Noted   Basal cell carcinoma of face 12/22/2021   Former smoker 08/24/2016   Coronary artery calcification 02/04/2016   Abnormal liver function test 10/15/2015   Abnormal CXR 05/01/2014   Diabetes mellitus type II 04/13/2012   High cholesterol 04/13/2012   Osteoarthritis of both knees 04/13/2012   Past Medical History:  Diagnosis Date   Arthritis    Diabetes mellitus without complication (HCC)    Hyperlipidemia    Hypertension    Prostatitis    Past Surgical History:  Procedure Laterality Date   APPENDECTOMY     Allergies  Allergen Reactions   Sulfa Antibiotics Itching   Prior to Admission medications   Medication Sig Start Date End Date Taking? Authorizing Provider  acetaminophen (TYLENOL) 500 MG tablet Take 500 mg by mouth every 6 (six) hours as needed.   Yes [provider]  aspirin 81 MG tablet Take 81 mg by mouth daily.   Yes [provider]  atorvastatin (LIPITOR) 40 MG tablet Take 1 tablet (40 mg total) by mouth daily. 05/23/22  Yes Shade Flood, MD  carvedilol (COREG) 6.25 MG tablet TAKE 1 TABLET TWICE DAILY  WITH MEALS 11/10/22  Yes Shade Flood, MD  cetirizine (ZYRTEC) 10 MG tablet Take 10 mg by mouth daily.   Yes [provider]  lisinopril (ZESTRIL) 40 MG tablet Take 1 tablet (40 mg total) by  mouth daily. 05/23/22  Yes Shade Flood, MD  metFORMIN (GLUCOPHAGE) 1000 MG tablet TAKE 1 TABLET TWICE DAILY  WITH MEALS 11/10/22  Yes Shade Flood, MD   Social History   Socioeconomic History   Marital status: Divorced    Spouse name: Not on file   Number of children: 2   Years of education: Not on file   Highest education level: High school graduate  Occupational History   Not on file  Tobacco Use   Smoking status: Former    Packs/day: 0.25    Years: 50.00    Additional pack years: 0.00    Total pack years: 12.50    Types: Cigarettes    Quit date: 03/18/2016  Years since quitting: 6.7   Smokeless tobacco: Never  Vaping Use   Vaping Use: Never used  Substance and Sexual Activity   Alcohol use: Yes    Alcohol/week: 0.0 standard drinks of alcohol    Comment: 4 beers once a week    Drug use: No   Sexual activity: Not on file  Other Topics Concern   Not on file  Social History Narrative   Not on file   Social Determinants of Health   Financial Resource Strain: Low Risk  (06/01/2022)   Overall Financial Resource Strain (CARDIA)    Difficulty of Paying Living Expenses: Not hard at all  Food Insecurity: No Food Insecurity (06/01/2022)   Hunger Vital Sign    Worried About Running Out of Food in the Last Year: Never true    Ran Out of Food in the Last Year: Never true  Transportation Needs: No Transportation Needs (06/01/2022)   PRAPARE - Administrator, Civil Service (Medical): No    Lack of Transportation (Non-Medical): No  Physical Activity: Inactive (06/01/2022)   Exercise Vital Sign    Days of Exercise per Week: 0 days    Minutes of Exercise per Session: 0 min  Stress: No Stress Concern Present (06/01/2022)   [employer] of Occupational Health - Occupational Stress Questionnaire    Feeling of Stress : Not at all  Social Connections: Moderately Isolated (06/01/2022)   Social Connection and Isolation Panel [NHANES]    Frequency of  Communication with Friends and Family: Twice a week    Frequency of Social Gatherings with Friends and Family: More than three times a week    Attends Religious Services: Never    Database administrator or Organizations: No    Attends Banker Meetings: Never    Marital Status: Married  Catering manager Violence: Not At Risk (06/01/2022)   Humiliation, Afraid, Rape, and Kick questionnaire    Fear of Current or Ex-Partner: No    Emotionally Abused: No    Physically Abused: No    Sexually Abused: No    Review of Systems  13 point review of systems per patient health survey noted.  Negative other than as indicated above or in HPI.   Objective:   Vitals:   12/14/22 1409  BP: 136/74  Pulse: 83  Temp: 98 F (36.7 C)  TempSrc: Temporal  SpO2: 98%  Weight: 232 lb 12.8 oz (105.6 kg)  Height: 6' (1.829 m)     Physical Exam Vitals reviewed.  Constitutional:      Appearance: He is well-developed.  HENT:     Head: Normocephalic and atraumatic.     Right Ear: External ear normal.     Left Ear: External ear normal.  Eyes:     Conjunctiva/sclera: Conjunctivae normal.     Pupils: Pupils are equal, round, and reactive to light.  Neck:     Thyroid: No thyromegaly.  Cardiovascular:     Rate and Rhythm: Normal rate and regular rhythm.     Heart sounds: Normal heart sounds.  Pulmonary:     Effort: Pulmonary effort is normal. No respiratory distress.     Breath sounds: Normal breath sounds. No wheezing.  Abdominal:     General: There is no distension.     Palpations: Abdomen is soft.     Tenderness: There is no abdominal tenderness.  Musculoskeletal:        General: No tenderness. Normal range of motion.  Cervical back: Normal range of motion and neck supple.  Lymphadenopathy:     Cervical: No cervical adenopathy.  Skin:    General: Skin is warm and dry.  Neurological:     Mental Status: He is alert and oriented to person, place, and time.     Deep Tendon  Reflexes: Reflexes are normal and symmetric.  Psychiatric:        Behavior: Behavior normal.        Assessment & Plan:  Nikholas Geffre is a 77 y.o. male . Annual physical exam  - -anticipatory guidance as below in AVS, screening labs above. Health maintenance items as above in HPI discussed/recommended as applicable.   Hyperlipidemia, unspecified hyperlipidemia type - Plan: Comprehensive metabolic panel, Lipid panel, atorvastatin (LIPITOR) 40 MG tablet  -  Stable, tolerating current regimen. Medications refilled. Labs pending as above.   DM type 2 with diabetic mixed hyperlipidemia (HCC) - Plan: Hemoglobin A1c, Microalbumin / creatinine urine ratio, metFORMIN (GLUCOPHAGE) 1000 MG tablet  -  Stable, tolerating current regimen. Medications refilled. Labs pending as above.   Essential hypertension - Plan: Comprehensive metabolic panel, carvedilol (COREG) 6.25 MG tablet, lisinopril (ZESTRIL) 40 MG tablet Coronary artery disease involving native heart without angina pectoris, unspecified vessel or lesion type - Plan: atorvastatin (LIPITOR) 40 MG tablet  -  Stable, tolerating current regimen. Medications refilled. Labs pending as above.  Keep follow-up with specialists as planned.  Need for pneumococcal vaccination - Plan: Pneumococcal conjugate vaccine 20-valent (Prevnar 20)   Meds ordered this encounter  Medications   atorvastatin (LIPITOR) 40 MG tablet    Sig: Take 1 tablet (40 mg total) by mouth daily.    Dispense:  90 tablet    Refill:  2   carvedilol (COREG) 6.25 MG tablet    Sig: Take 1 tablet (6.25 mg total) by mouth 2 (two) times daily with a meal.    Dispense:  180 tablet    Refill:  2   lisinopril (ZESTRIL) 40 MG tablet    Sig: Take 1 tablet (40 mg total) by mouth daily.    Dispense:  90 tablet    Refill:  2   metFORMIN (GLUCOPHAGE) 1000 MG tablet    Sig: Take 1 tablet (1,000 mg total) by mouth 2 (two) times daily with a meal.    Dispense:  180 tablet    Refill:  2    Patient Instructions  Pneumonia vaccine updated today.  Flu vaccine in the fall and there should be a new COVID booster at that time as well that I would recommend.  Also recommend dental evaluation at some point.  Continue follow-up with your eye and other specialists as planned.  I did refill your medications and check labs today.  I will let you know if there are concerns.  No med changes at this time.  Glad you are doing well and take care!  Preventive Care 93 Years and Older, Male Preventive care refers to lifestyle choices and visits with your health care provider that can promote health and wellness. Preventive care visits are also called wellness exams. What can I expect for my preventive care visit? Counseling During your preventive care visit, your health care provider may ask about your: Medical history, including: Past medical problems. Family medical history. History of falls. Current health, including: Emotional well-being. Home life and relationship well-being. Sexual activity. Memory and ability to understand (cognition). Lifestyle, including: Alcohol, nicotine or tobacco, and drug use. Access to firearms. Diet, exercise, and  sleep habits. Work and work Astronomer. Sunscreen use. Safety issues such as seatbelt and bike helmet use. Physical exam Your health care provider will check your: Height and weight. These may be used to calculate your BMI (body mass index). BMI is a measurement that tells if you are at a healthy weight. Waist circumference. This measures the distance around your waistline. This measurement also tells if you are at a healthy weight and may help predict your risk of certain diseases, such as type 2 diabetes and high blood pressure. Heart rate and blood pressure. Body temperature. Skin for abnormal spots. What immunizations do I need?  Vaccines are usually given at various ages, according to a schedule. Your health care provider will recommend  vaccines for you based on your age, medical history, and lifestyle or other factors, such as travel or where you work. What tests do I need? Screening Your health care provider may recommend screening tests for certain conditions. This may include: Lipid and cholesterol levels. Diabetes screening. This is done by checking your blood sugar (glucose) after you have not eaten for a while (fasting). Hepatitis C test. Hepatitis B test. HIV (human immunodeficiency virus) test. STI (sexually transmitted infection) testing, if you are at risk. Lung cancer screening. Colorectal cancer screening. Prostate cancer screening. Abdominal aortic aneurysm (AAA) screening. You may need this if you are a current or former smoker. Talk with your health care provider about your test results, treatment options, and if necessary, the need for more tests. Follow these instructions at home: Eating and drinking  Eat a diet that includes fresh fruits and vegetables, whole grains, lean protein, and low-fat dairy products. Limit your intake of foods with high amounts of sugar, saturated fats, and salt. Take vitamin and mineral supplements as recommended by your health care provider. Do not drink alcohol if your health care provider tells you not to drink. If you drink alcohol: Limit how much you have to 0-2 drinks a day. Know how much alcohol is in your drink. In the [place] one drink equals one 12 oz bottle of beer (355 mL), one 5 oz glass of wine (148 mL), or one 1 oz glass of hard liquor (44 mL). Lifestyle Brush your teeth every morning and night with fluoride toothpaste. Floss one time each day. Exercise for at least 30 minutes 5 or more days each week. Do not use any products that contain nicotine or tobacco. These products include cigarettes, chewing tobacco, and vaping devices, such as e-cigarettes. If you need help quitting, ask your health care provider. Do not use drugs. If you are sexually active, practice  safe sex. Use a condom or other form of protection to prevent STIs. Take aspirin only as told by your health care provider. Make sure that you understand how much to take and what form to take. Work with your health care provider to find out whether it is safe and beneficial for you to take aspirin daily. Ask your health care provider if you need to take a cholesterol-lowering medicine (statin). Find healthy ways to manage stress, such as: Meditation, yoga, or listening to music. Journaling. Talking to a trusted person. Spending time with friends and family. Safety Always wear your seat belt while driving or riding in a vehicle. Do not drive: If you have been drinking alcohol. Do not ride with someone who has been drinking. When you are tired or distracted. While texting. If you have been using any mind-altering substances or drugs. Wear a helmet and  other protective equipment during sports activities. If you have firearms in your house, make sure you follow all gun safety procedures. Minimize exposure to UV radiation to reduce your risk of skin cancer. What's next? Visit your health care provider once a year for an annual wellness visit. Ask your health care provider how often you should have your eyes and teeth checked. Stay up to date on all vaccines. This information is not intended to replace advice given to you by your health care provider. Make sure you discuss any questions you have with your health care provider. Document Revised: 12/23/2020 Document Reviewed: 12/23/2020 Elsevier Patient Education  2024 [employer].     Signed,   Meredith Staggers, MD [facility], [facility] 12/14/22 2:46 PM

## 2022-12-15 LAB — LIPID PANEL
Cholesterol: 150 mg/dL (ref 0–200)
HDL: 50.2 mg/dL (ref >39.00)
LDL Cholesterol: 65 mg/dL (ref 0–99)
NonHDL: 99.33
Total CHOL/HDL Ratio: 3
Triglycerides: 172 mg/dL — ABNORMAL HIGH (ref 0.0–149.0)
VLDL: 34.4 mg/dL (ref 0.0–40.0)

## 2022-12-15 LAB — MICROALBUMIN / CREATININE URINE RATIO
Creatinine,U: 141.3 mg/dL
Microalb Creat Ratio: 3.8 mg/g (ref 0.0–30.0)
Microalb, Ur: 5.4 mg/dL — ABNORMAL HIGH (ref 0.0–1.9)

## 2022-12-15 LAB — COMPREHENSIVE METABOLIC PANEL
ALT: 24 U/L (ref 0–53)
AST: 22 U/L (ref 0–37)
Albumin: 4 g/dL (ref 3.5–5.2)
Alkaline Phosphatase: 114 U/L (ref 39–117)
BUN: 13 mg/dL (ref 6–23)
CO2: 26 mEq/L (ref 19–32)
Calcium: 9 mg/dL (ref 8.4–10.5)
Chloride: 103 mEq/L (ref 96–112)
Creatinine, Ser: 0.69 mg/dL (ref 0.40–1.50)
GFR: 89.11 mL/min (ref >60.00)
Glucose, Bld: 150 mg/dL — ABNORMAL HIGH (ref 70–99)
Potassium: 4.2 mEq/L (ref 3.5–5.1)
Sodium: 138 mEq/L (ref 135–145)
Total Bilirubin: 0.9 mg/dL (ref 0.2–1.2)
Total Protein: 6.6 g/dL (ref 6.0–8.3)

## 2022-12-15 LAB — HEMOGLOBIN A1C: Hgb A1c MFr Bld: 6.9 % — ABNORMAL HIGH (ref 4.6–6.5)

## 2022-12-18 ENCOUNTER — Encounter: Payer: Self-pay | Admitting: Family Medicine

## 2022-12-27 ENCOUNTER — Encounter: Payer: Self-pay | Admitting: Family Medicine

## 2023-02-09 DIAGNOSIS — M25562 Pain in left knee: Secondary | ICD-10-CM | POA: Diagnosis not present

## 2023-03-23 ENCOUNTER — Encounter: Payer: Self-pay | Admitting: Family Medicine

## 2023-03-30 ENCOUNTER — Ambulatory Visit (INDEPENDENT_AMBULATORY_CARE_PROVIDER_SITE_OTHER): Payer: Medicare HMO | Admitting: *Deleted

## 2023-03-30 DIAGNOSIS — Z0000 Encounter for general adult medical examination without abnormal findings: Secondary | ICD-10-CM | POA: Diagnosis not present

## 2023-03-30 NOTE — Progress Notes (Signed)
Subjective:   Christopher Bauer is a 77 y.o. male who presents for Medicare Annual/Subsequent preventive examination.  Visit Complete: Virtual  I connected with  Christopher Bauer on 03/30/23 by a audio enabled telemedicine application and verified that I am speaking with the correct person using two identifiers.  Patient Location: Home  Provider Location: Home Office  I discussed the limitations of evaluation and management by telemedicine. The patient expressed understanding and agreed to proceed.  Patient Medicare AWV questionnaire was completed by the patient on 03-29-2023; I have confirmed that all information answered by patient is correct and no changes since this date.  Cardiac Risk Factors include: advanced age (>43men, >59 women);diabetes mellitus;male gender    Vital Signs: Unable to obtain new vitals due to this being a telehealth visit.  Objective:    There were no vitals filed for this visit. There is no height or weight on file to calculate BMI.     03/30/2023    8:48 AM 06/01/2022    8:29 AM 03/17/2020    1:47 PM 03/22/2019    3:49 PM 03/15/2019    8:05 AM 08/24/2017    9:24 AM  Advanced Directives  Does Patient Have a Medical Advance Directive? No No Yes No No No  Type of Advance Directive   Healthcare Power of Attorney     Does patient want to make changes to medical advance directive?   No - Patient declined     Copy of Healthcare Power of Attorney in Chart?   No - copy requested     Would patient like information on creating a medical advance directive?  No - Patient declined  No - Patient declined No - Patient declined Yes (MAU/Ambulatory/Procedural Areas - Information given)    Current Medications (verified) Outpatient Encounter Medications as of 03/30/2023  Medication Sig   aspirin 81 MG tablet Take 81 mg by mouth daily.   atorvastatin (LIPITOR) 40 MG tablet Take 1 tablet (40 mg total) by mouth daily.   carvedilol (COREG) 6.25 MG tablet Take 1 tablet (6.25 mg total)  by mouth 2 (two) times daily with a meal.   lisinopril (ZESTRIL) 40 MG tablet Take 1 tablet (40 mg total) by mouth daily.   metFORMIN (GLUCOPHAGE) 1000 MG tablet Take 1 tablet (1,000 mg total) by mouth 2 (two) times daily with a meal.   acetaminophen (TYLENOL) 500 MG tablet Take 500 mg by mouth every 6 (six) hours as needed.   cetirizine (ZYRTEC) 10 MG tablet Take 10 mg by mouth daily.   No facility-administered encounter medications on file as of 03/30/2023.    Allergies (verified) Sulfa antibiotics   History: Past Medical History:  Diagnosis Date   Arthritis    Diabetes mellitus without complication (HCC)    Glaucoma    Hyperlipidemia    Hypertension    Prostatitis    Past Surgical History:  Procedure Laterality Date   APPENDECTOMY     Family History  Problem Relation Age of Onset   Heart disease Mother    Social History   Socioeconomic History   Marital status: Divorced    Spouse name: Not on file   Number of children: 2   Years of education: Not on file   Highest education level: High school graduate  Occupational History   Not on file  Tobacco Use   Smoking status: Former    Current packs/day: 0.00    Average packs/day: 0.3 packs/day for 50.0 years (12.5 ttl pk-yrs)  Types: Cigarettes    Start date: 03/18/1966    Quit date: 03/18/2016    Years since quitting: 7.0   Smokeless tobacco: Never  Vaping Use   Vaping status: Never Used  Substance and Sexual Activity   Alcohol use: Yes    Alcohol/week: 0.0 standard drinks of alcohol    Comment: 4 beers once a week    Drug use: No   Sexual activity: Not Currently  Other Topics Concern   Not on file  Social History Narrative   Not on file   Social Determinants of Health   Financial Resource Strain: Low Risk  (03/30/2023)   Overall Financial Resource Strain (CARDIA)    Difficulty of Paying Living Expenses: Not hard at all  Food Insecurity: No Food Insecurity (03/30/2023)   Hunger Vital Sign    Worried About  Running Out of Food in the Last Year: Never true    Ran Out of Food in the Last Year: Never true  Transportation Needs: No Transportation Needs (03/30/2023)   PRAPARE - Administrator, Civil Service (Medical): No    Lack of Transportation (Non-Medical): No  Physical Activity: Insufficiently Active (03/30/2023)   Exercise Vital Sign    Days of Exercise per Week: 2 days    Minutes of Exercise per Session: 10 min  Stress: No Stress Concern Present (03/30/2023)   [employer] of Occupational Health - Occupational Stress Questionnaire    Feeling of Stress : Not at all  Social Connections: Socially Isolated (03/30/2023)   Social Connection and Isolation Panel [NHANES]    Frequency of Communication with Friends and Family: Never    Frequency of Social Gatherings with Friends and Family: Three times a week    Attends Religious Services: Never    Active Member of Clubs or Organizations: No    Attends Engineer, structural: Never    Marital Status: Divorced    Tobacco Counseling Counseling given: Not Answered   Clinical Intake:  Pre-visit preparation completed: Yes  Pain : No/denies pain     Diabetes: Yes CBG done?: No Did pt. bring in CBG monitor from home?: No  How often do you need to have someone help you when you read instructions, pamphlets, or other written materials from your doctor or pharmacy?: 1 - Never  Interpreter Needed?: No  Information entered by :: Remi Haggard LPN   Activities of Daily Living    03/30/2023    8:49 AM 03/29/2023    6:16 PM  In your present state of health, do you have any difficulty performing the following activities:  Hearing? 0 0  Vision? 0 0  Difficulty concentrating or making decisions? 0 0  Walking or climbing stairs? 0 0  Dressing or bathing? 0 0  Doing errands, shopping? 0 0  Preparing Food and eating ? N N  Using the Toilet? N N  In the past six months, have you accidently leaked urine? N N  Do you have  problems with loss of bowel control? N N  Managing your Medications? N N  Managing your Finances? N N  Housekeeping or managing your Housekeeping? N N    Patient Care Team: Shade Flood, MD as PCP - General (Family Medicine) Macdougal Decamp, MD as Consulting Physician (Cardiology) Sallye Lat, MD as Consulting Physician (Ophthalmology)  Indicate any recent Medical Services you may have received from other than [facility] providers in the past year (date may be approximate).     Assessment:  This is a routine wellness examination for Vallie.  Hearing/Vision screen No results found.   Goals Addressed             This Visit's Progress    Weight (lb) < 200 lb (90.7 kg)         Depression Screen    03/30/2023    8:36 AM 12/14/2022    2:06 PM 06/01/2022    8:35 AM 05/23/2022    8:19 AM 11/15/2021    8:37 AM 05/28/2021    8:52 AM 02/19/2021    9:44 AM  PHQ 2/9 Scores  PHQ - 2 Score 0 0 0 0 0 0 0  PHQ- 9 Score 0 0 0 0 0  1    Fall Risk    03/30/2023    8:34 AM 03/29/2023    6:16 PM 03/17/2023    2:02 PM 12/14/2022    2:06 PM 05/25/2022   11:03 AM  Fall Risk   Falls in the past year? 0 0 0 0 0  Number falls in past yr:  0 0 0   Injury with Fall?  0 0 0   Risk for fall due to :    No Fall Risks   Follow up    Falls evaluation completed     MEDICARE RISK AT HOME: Medicare Risk at Home Any stairs in or around the home?: No If so, are there any without handrails?: No Home free of loose throw rugs in walkways, pet beds, electrical cords, etc?: Yes Adequate lighting in your home to reduce risk of falls?: Yes Life alert?: No Use of a cane, walker or w/c?: No Grab bars in the bathroom?: No Shower chair or bench in shower?: No Elevated toilet seat or a handicapped toilet?: No  TIMED UP AND GO:  Was the test performed?  No    Cognitive Function:        03/30/2023    8:35 AM 06/01/2022    8:32 AM 03/17/2020    1:45 PM 03/15/2019    8:05 AM 08/24/2017    9:29 AM  6CIT  Screen  What Year? 0 points 0 points 0 points 0 points 0 points  What month? 0 points 0 points 0 points 0 points 0 points  What time?  0 points 0 points 0 points 0 points  Count back from 20 2 points 0 points 0 points 0 points 0 points  Months in reverse 4 points 0 points 2 points 0 points 4 points  Repeat phrase 0 points 0 points 0 points 0 points 2 points  Total Score  0 points 2 points 0 points 6 points    Immunizations Immunization History  Administered Date(s) Administered   Fluad Quad(high Dose 65+) 03/01/2019, 05/28/2021, 05/23/2022   Influenza Split 04/12/2013   Influenza,inj,Quad PF,6+ Mos 05/25/2016, 02/26/2020   Influenza-Unspecified 04/10/2014, 04/20/2015, 05/11/2018   Moderna Sars-Covid-2 Vaccination 08/26/2019, 09/24/2019   PFIZER(Purple Top)SARS-COV-2 Vaccination 05/05/2020   PNEUMOCOCCAL CONJUGATE-20 12/14/2022   Pneumococcal Conjugate-13 05/01/2014   Pneumococcal Polysaccharide-23 02/04/2016   Tdap 02/15/2017    TDAP status: Up to date  Flu Vaccine status: Due, Education has been provided regarding the importance of this vaccine. Advised may receive this vaccine at local pharmacy or Health Dept. Aware to provide a copy of the vaccination record if obtained from local pharmacy or Health Dept. Verbalized acceptance and understanding.  Pneumococcal vaccine status: Up to date  Covid-19 vaccine status: Information provided on how to obtain vaccines.   Qualifies  for Shingles Vaccine? Yes   Zostavax completed No   Shingrix Completed?: No.    Education has been provided regarding the importance of this vaccine. Patient has been advised to call insurance company to determine out of pocket expense if they have not yet received this vaccine. Advised may also receive vaccine at local pharmacy or Health Dept. Verbalized acceptance and understanding.  Screening Tests Health Maintenance  Topic Date Due   COVID-19 Vaccine (4 - 2023-24 season) 04/15/2023 (Originally 03/12/2023)    Zoster Vaccines- Shingrix (1 of 2) 06/29/2023 (Originally 03/16/1965)   INFLUENZA VACCINE  10/09/2023 (Originally 02/09/2023)   FOOT EXAM  05/24/2023   HEMOGLOBIN A1C  06/15/2023   OPHTHALMOLOGY EXAM  10/27/2023   Diabetic kidney evaluation - eGFR measurement  12/14/2023   Diabetic kidney evaluation - Urine ACR  12/14/2023   Medicare Annual Wellness (AWV)  03/29/2024   DTaP/Tdap/Td (2 - Td or Tdap) 02/16/2027   Pneumonia Vaccine 71+ Years old  Completed   Hepatitis C Screening  Completed   HPV VACCINES  Aged Out   Fecal DNA (Cologuard)  Discontinued    Health Maintenance  There are no preventive care reminders to display for this patient.   Colorectal cancer screening: No longer required.   Lung Cancer Screening: (Low Dose CT Chest recommended if Age 86-80 years, 20 pack-year currently smoking OR have quit w/in 15years.) does not qualify.   Lung Cancer Screening Referral:   Additional Screening:  Hepatitis C Screening: does not qualify; Completed 2017  Vision Screening: Recommended annual ophthalmology exams for early detection of glaucoma and other disorders of the eye. Is the patient up to date with their annual eye exam?  Yes  Who is the provider or what is the name of the office in which the patient attends annual eye exams? [facility] If pt is not established with a provider, would they like to be referred to a provider to establish care? No .   Dental Screening: Recommended annual dental exams for proper oral hygiene  Nutrition Risk Assessment:  Has the patient had any N/V/D within the last 2 months?  No  Does the patient have any non-healing wounds?  No  Has the patient had any unintentional weight loss or weight gain?  No   Diabetes:  Is the patient diabetic?  Yes  If diabetic, was a CBG obtained today?  No  Did the patient bring in their glucometer from home?  No  How often do you monitor your CBG's? Does not check at home.   Financial Strains and Diabetes  Management:  Are you having any financial strains with the device, your supplies or your medication? No .  Does the patient want to be seen by Chronic Care Management for management of their diabetes?  No  Would the patient like to be referred to a Nutritionist or for Diabetic Management?  No   Diabetic Exams:  Diabetic Eye Exam: Completed.Pt has been advised about the importance in completing this exam  Diabetic Foot Exam:. Pt has been advised about the importance in completing this exam  Community Resource Referral / Chronic Care Management: CRR required this visit?  No   CCM required this visit?  No     Plan:     I have personally reviewed and noted the following in the patient's chart:   Medical and social history Use of alcohol, tobacco or illicit drugs  Current medications and supplements including opioid prescriptions. Patient is not currently taking opioid prescriptions. Functional  ability and status Nutritional status Physical activity Advanced directives List of other physicians Hospitalizations, surgeries, and ER visits in previous 12 months Vitals Screenings to include cognitive, depression, and falls Referrals and appointments  In addition, I have reviewed and discussed with patient certain preventive protocols, quality metrics, and best practice recommendations. A written personalized care plan for preventive services as well as general preventive health recommendations were provided to patient.     Remi Haggard, LPN   10/17/8117   After Visit Summary: (MyChart) Due to this being a telephonic visit, the after visit summary with patients personalized plan was offered to patient via MyChart   Nurse Notes:

## 2023-03-30 NOTE — Patient Instructions (Signed)
Christopher Bauer , Thank you for taking time to come for your Medicare Wellness Visit. I appreciate your ongoing commitment to your health goals. Please review the following plan we discussed and let me know if I can assist you in the future.   Screening recommendations/referrals: Colonoscopy: no longer required Recommended yearly ophthalmology/optometry visit for glaucoma screening and checkup Recommended yearly dental visit for hygiene and checkup  Vaccinations: Influenza vaccine: Education provided Pneumococcal vaccine: up to date Tdap vaccine: up to date Shingles vaccine: Education provided    Advanced directives: Education provided   Preventive Care 65 Years and Older, Male Preventive care refers to lifestyle choices and visits with your health care provider that can promote health and wellness. What does preventive care include? A yearly physical exam. This is also called an annual well check. Dental exams once or twice a year. Routine eye exams. Ask your health care provider how often you should have your eyes checked. Personal lifestyle choices, including: Daily care of your teeth and gums. Regular physical activity. Eating a healthy diet. Avoiding tobacco and drug use. Limiting alcohol use. Practicing safe sex. Taking low doses of aspirin every day. Taking vitamin and mineral supplements as recommended by your health care provider. What happens during an annual well check? The services and screenings done by your health care provider during your annual well check will depend on your age, overall health, lifestyle risk factors, and family history of disease. Counseling  Your health care provider may ask you questions about your: Alcohol use. Tobacco use. Drug use. Emotional well-being. Home and relationship well-being. Sexual activity. Eating habits. History of falls. Memory and ability to understand (cognition). Work and work Astronomer. Screening  You may have the  following tests or measurements: Height, weight, and BMI. Blood pressure. Lipid and cholesterol levels. These may be checked every 5 years, or more frequently if you are over 89 years old. Skin check. Lung cancer screening. You may have this screening every year starting at age 62 if you have a 30-pack-year history of smoking and currently smoke or have quit within the past 15 years. Fecal occult blood test (FOBT) of the stool. You may have this test every year starting at age 64. Flexible sigmoidoscopy or colonoscopy. You may have a sigmoidoscopy every 5 years or a colonoscopy every 10 years starting at age 41. Prostate cancer screening. Recommendations will vary depending on your family history and other risks. Hepatitis C blood test. Hepatitis B blood test. Sexually transmitted disease (STD) testing. Diabetes screening. This is done by checking your blood sugar (glucose) after you have not eaten for a while (fasting). You may have this done every 1-3 years. Abdominal aortic aneurysm (AAA) screening. You may need this if you are a current or former smoker. Osteoporosis. You may be screened starting at age 69 if you are at high risk. Talk with your health care provider about your test results, treatment options, and if necessary, the need for more tests. Vaccines  Your health care provider may recommend certain vaccines, such as: Influenza vaccine. This is recommended every year. Tetanus, diphtheria, and acellular pertussis (Tdap, Td) vaccine. You may need a Td booster every 10 years. Zoster vaccine. You may need this after age 30. Pneumococcal 13-valent conjugate (PCV13) vaccine. One dose is recommended after age 63. Pneumococcal polysaccharide (PPSV23) vaccine. One dose is recommended after age 34. Talk to your health care provider about which screenings and vaccines you need and how often you need them. This information is  not intended to replace advice given to you by your health care  provider. Make sure you discuss any questions you have with your health care provider. Document Released: 07/24/2015 Document Revised: 03/16/2016 Document Reviewed: 04/28/2015 Elsevier Interactive Patient Education  2017 [employer].  Fall Prevention in the Home Falls can cause injuries. They can happen to people of all ages. There are many things you can do to make your home safe and to help prevent falls. What can I do on the outside of my home? Regularly fix the edges of walkways and driveways and fix any cracks. Remove anything that might make you trip as you walk through a door, such as a raised step or threshold. Trim any bushes or trees on the path to your home. Use bright outdoor lighting. Clear any walking paths of anything that might make someone trip, such as rocks or tools. Regularly check to see if handrails are loose or broken. Make sure that both sides of any steps have handrails. Any raised decks and porches should have guardrails on the edges. Have any leaves, snow, or ice cleared regularly. Use sand or salt on walking paths during winter. Clean up any spills in your garage right away. This includes oil or grease spills. What can I do in the bathroom? Use night lights. Install grab bars by the toilet and in the tub and shower. Do not use towel bars as grab bars. Use non-skid mats or decals in the tub or shower. If you need to sit down in the shower, use a plastic, non-slip stool. Keep the floor dry. Clean up any water that spills on the floor as soon as it happens. Remove soap buildup in the tub or shower regularly. Attach bath mats securely with double-sided non-slip rug tape. Do not have throw rugs and other things on the floor that can make you trip. What can I do in the bedroom? Use night lights. Make sure that you have a light by your bed that is easy to reach. Do not use any sheets or blankets that are too big for your bed. They should not hang down onto the  floor. Have a firm chair that has side arms. You can use this for support while you get dressed. Do not have throw rugs and other things on the floor that can make you trip. What can I do in the kitchen? Clean up any spills right away. Avoid walking on wet floors. Keep items that you use a lot in easy-to-reach places. If you need to reach something above you, use a strong step stool that has a grab bar. Keep electrical cords out of the way. Do not use floor polish or wax that makes floors slippery. If you must use wax, use non-skid floor wax. Do not have throw rugs and other things on the floor that can make you trip. What can I do with my stairs? Do not leave any items on the stairs. Make sure that there are handrails on both sides of the stairs and use them. Fix handrails that are broken or loose. Make sure that handrails are as long as the stairways. Check any carpeting to make sure that it is firmly attached to the stairs. Fix any carpet that is loose or worn. Avoid having throw rugs at the top or bottom of the stairs. If you do have throw rugs, attach them to the floor with carpet tape. Make sure that you have a light switch at the top of the  stairs and the bottom of the stairs. If you do not have them, ask someone to add them for you. What else can I do to help prevent falls? Wear shoes that: Do not have high heels. Have rubber bottoms. Are comfortable and fit you well. Are closed at the toe. Do not wear sandals. If you use a stepladder: Make sure that it is fully opened. Do not climb a closed stepladder. Make sure that both sides of the stepladder are locked into place. Ask someone to hold it for you, if possible. Clearly mark and make sure that you can see: Any grab bars or handrails. First and last steps. Where the edge of each step is. Use tools that help you move around (mobility aids) if they are needed. These include: Canes. Walkers. Scooters. Crutches. Turn on the  lights when you go into a dark area. Replace any light bulbs as soon as they burn out. Set up your furniture so you have a clear path. Avoid moving your furniture around. If any of your floors are uneven, fix them. If there are any pets around you, be aware of where they are. Review your medicines with your doctor. Some medicines can make you feel dizzy. This can increase your chance of falling. Ask your doctor what other things that you can do to help prevent falls. This information is not intended to replace advice given to you by your health care provider. Make sure you discuss any questions you have with your health care provider. Document Released: 04/23/2009 Document Revised: 12/03/2015 Document Reviewed: 08/01/2014 Elsevier Interactive Patient Education  2017 [employer].

## 2023-04-30 ENCOUNTER — Ambulatory Visit
Admission: RE | Admit: 2023-04-30 | Discharge: 2023-04-30 | Disposition: A | Discharge status: Home / Self Care | Payer: Medicare HMO | Source: Ambulatory Visit | Attending: Internal Medicine | Admitting: Internal Medicine

## 2023-04-30 VITALS — BP 153/78 | HR 72 | Temp 97.8°F | Resp 18 | Ht 72.0 in | Wt 240.0 lb

## 2023-04-30 DIAGNOSIS — M542 Cervicalgia: Secondary | ICD-10-CM

## 2023-04-30 DIAGNOSIS — R229 Localized swelling, mass and lump, unspecified: Secondary | ICD-10-CM | POA: Diagnosis not present

## 2023-04-30 MED ORDER — NAPROXEN 375 MG PO TABS
375.0000 mg | ORAL_TABLET | Freq: Two times a day (BID) | ORAL | 0 refills | Status: AC
Start: 2023-04-30 — End: 2023-05-07

## 2023-04-30 MED ORDER — CEPHALEXIN 500 MG PO CAPS
500.0000 mg | ORAL_CAPSULE | Freq: Three times a day (TID) | ORAL | 0 refills | Status: AC
Start: 2023-04-30 — End: 2023-05-07

## 2023-04-30 MED ORDER — HYDROXYZINE HCL 25 MG PO TABS
25.0000 mg | ORAL_TABLET | Freq: Two times a day (BID) | ORAL | 0 refills | Status: DC | PRN
Start: 2023-04-30 — End: 2023-09-22

## 2023-04-30 NOTE — ED Triage Notes (Addendum)
Patient states he slept wrong and used Aleve X pain reliever lotion on his right side of neck, resulting with inflamed lymph node in neck, itching and burning x 1.5 weeks.  Treatment includes wet compress, Ibuprofen, heat and cool ice pack. Also states he took a left over oxycodone from dentist appointment without relief.

## 2023-04-30 NOTE — Discharge Instructions (Addendum)
Start naproxen twice daily for 7 days.  You may take hydroxyzine twice daily as needed for itching.  Please note this medication can make you drowsy.  Do not drink alcohol or drive on this medication.  Start Keflex 3 times a day for a week to cover for any underlying infection.  Continue cool compresses to the area.  Please follow-up with your PCP in 2 days for recheck.  Please go to the ER ASAP if you develop any worsening symptoms.  I hope you feel better soon!

## 2023-04-30 NOTE — ED Provider Notes (Signed)
[facility]    CSN: [id number] Arrival date & time: 04/30/23  1144      History   Chief Complaint Chief Complaint  Patient presents with   Allergic Reaction    Used otc pain lotion aleveX - possibly inflamed lymph node - Entered by patient    HPI Christopher Bauer is a 77 y.o. male presents for swelling on the backside of his neck.  Patient reports about a week and a half ago he had some muscle tension to the right backside of his neck.  He had picked up a new product over-the-counter which is a topical Aleve.  States he is used it on his knees before without issue.  He applied it to the area and very shortly after that he developed some swelling that has persisted.  Also continues to report muscle pain at the site that radiates down into his right trapezius.  Denies any rashes, warmth or erythema.  No shortness of breath, tongue/lip/throat swelling.  He has been taking Tylenol for his symptoms.  He also took an oxycodone leftover from a previous dental procedure.  He does endorse being under a lot of stress due to medical issues with his partner.  No other concerns at this time.   Allergic Reaction   Past Medical History:  Diagnosis Date   Arthritis    Diabetes mellitus without complication (HCC)    Glaucoma    Hyperlipidemia    Hypertension    Prostatitis     Patient Active Problem List   Diagnosis Date Noted   Basal cell carcinoma of face 12/22/2021   Former smoker 08/24/2016   Coronary artery calcification 02/04/2016   Abnormal liver function test 10/15/2015   Abnormal CXR 05/01/2014   Diabetes mellitus, type II (HCC) 04/13/2012   High cholesterol 04/13/2012   Osteoarthritis of both knees 04/13/2012    Past Surgical History:  Procedure Laterality Date   APPENDECTOMY         Home Medications    Prior to Admission medications   Medication Sig Start Date End Date Taking? Authorizing Provider  cephALEXin (KEFLEX) 500 MG capsule Take 1 capsule (500 mg  total) by mouth 3 (three) times daily for 7 days. 04/30/23 05/07/23 Yes Radford Pax, NP  hydrOXYzine (ATARAX) 25 MG tablet Take 1 tablet (25 mg total) by mouth 2 (two) times daily as needed for itching. 04/30/23  Yes Radford Pax, NP  naproxen (NAPROSYN) 375 MG tablet Take 1 tablet (375 mg total) by mouth 2 (two) times daily for 7 days. 04/30/23 05/07/23 Yes Radford Pax, NP  acetaminophen (TYLENOL) 500 MG tablet Take 500 mg by mouth every 6 (six) hours as needed.    [provider]  aspirin 81 MG tablet Take 81 mg by mouth daily.    [provider]  atorvastatin (LIPITOR) 40 MG tablet Take 1 tablet (40 mg total) by mouth daily. 12/14/22   Shade Flood, MD  carvedilol (COREG) 6.25 MG tablet Take 1 tablet (6.25 mg total) by mouth 2 (two) times daily with a meal. 12/14/22   Shade Flood, MD  cetirizine (ZYRTEC) 10 MG tablet Take 10 mg by mouth daily.    [provider]  lisinopril (ZESTRIL) 40 MG tablet Take 1 tablet (40 mg total) by mouth daily. 12/14/22   Shade Flood, MD  lisinopril-hydrochlorothiazide (ZESTORETIC) 20-12.5 MG tablet     [provider]  metFORMIN (GLUCOPHAGE) 1000 MG tablet Take 1 tablet (1,000 mg total)  by mouth 2 (two) times daily with a meal. 12/14/22   Shade Flood, MD    Family History Family History  Problem Relation Age of Onset   Heart disease Mother     Social History Social History   Tobacco Use   Smoking status: Former    Current packs/day: 0.00    Average packs/day: 0.3 packs/day for 50.0 years (12.5 ttl pk-yrs)    Types: Cigarettes    Start date: 03/18/1966    Quit date: 03/18/2016    Years since quitting: 7.1   Smokeless tobacco: Never  Vaping Use   Vaping status: Never Used  Substance Use Topics   Alcohol use: Yes    Alcohol/week: 0.0 standard drinks of alcohol    Comment: 4 beers once a week    Drug use: No     Allergies   Sulfa antibiotics   Review of Systems Review of Systems   Musculoskeletal:  Positive for neck pain.     Physical Exam Triage Vital Signs ED Triage Vitals  Encounter Vitals Group     BP 04/30/23 1220 (!) 153/78     Systolic BP Percentile --      Diastolic BP Percentile --      Pulse Rate 04/30/23 1220 72     Resp 04/30/23 1220 18     Temp 04/30/23 1220 97.8 F (36.6 C)     Temp Source 04/30/23 1220 Oral     SpO2 04/30/23 1220 95 %     Weight 04/30/23 1218 240 lb (108.9 kg)     Height 04/30/23 1218 6' (1.829 m)     Head Circumference --      Peak Flow --      Pain Score 04/30/23 1218 9     Pain Loc --      Pain Education --      Exclude from Growth Chart --    No data found.  Updated Vital Signs BP (!) 153/78 (BP Location: Right Arm)   Pulse 72   Temp 97.8 F (36.6 C) (Oral)   Resp 18   Ht 6' (1.829 m)   Wt 240 lb (108.9 kg)   SpO2 95%   BMI 32.55 kg/m   Visual Acuity Right Eye Distance:   Left Eye Distance:   Bilateral Distance:    Right Eye Near:   Left Eye Near:    Bilateral Near:     Physical Exam Vitals and nursing note reviewed.  Constitutional:      General: He is not in acute distress.    Appearance: Normal appearance. He is not ill-appearing.  HENT:     Head: Normocephalic and atraumatic.  Eyes:     Pupils: Pupils are equal, round, and reactive to light.  Neck:      Comments: There is some mild soft tissue swelling without induration or fluctuance to the right posterior scalp that extends slightly into the upper neck.  There are no rashes, lesions, vesicles, warmth, erythema.  Mildly tender to palpation that extends to the right paracervical muscles into the right trapezius.  Skin is intact.  There is no adenopathy.  Full range of motion of neck. Cardiovascular:     Rate and Rhythm: Normal rate.  Pulmonary:     Effort: Pulmonary effort is normal.  Musculoskeletal:     Cervical back: Normal range of motion and neck supple. No signs of trauma, rigidity or torticollis. Pain with movement and muscular  tenderness present. No spinous process tenderness.  Normal range of motion.  Skin:    General: Skin is warm and dry.  Neurological:     General: No focal deficit present.     Mental Status: He is alert and oriented to person, place, and time.  Psychiatric:        Mood and Affect: Mood normal.        Behavior: Behavior normal.      UC Treatments / Results  Labs (all labs ordered are listed, but only abnormal results are displayed) Labs Reviewed - No data to display  EKG   Radiology No results found.  Procedures Procedures (including critical care time)  Medications Ordered in UC Medications - No data to display  Initial Impression / Assessment and Plan / UC Course  I have reviewed the triage vital signs and the nursing notes.  Pertinent labs & imaging results that were available during my care of the patient were reviewed by me and considered in my medical decision making (see chart for details).     Reviewed exam and symptoms with patient.  No red flags.  Doubt this is allergic reaction given it has been a week and a half since application of the Aleve topical.  Differentials include infection, muscle strain, allergic reaction.  Will do trial of naproxen and hydroxyzine, side effect profile reviewed.  Will also do Keflex 3 times daily for 7 days to cover any underlying infection.  Cool compresses to the area.  Advised PCP follow-up 2 days for recheck.  Strict ER precautions reviewed and patient verbalized understanding. Final Clinical Impressions(s) / UC Diagnoses   Final diagnoses:  Neck pain on right side  Localized soft tissue swelling     Discharge Instructions      Start naproxen twice daily for 7 days.  You may take hydroxyzine twice daily as needed for itching.  Please note this medication can make you drowsy.  Do not drink alcohol or drive on this medication.  Start Keflex 3 times a day for a week to cover for any underlying infection.  Continue cool compresses  to the area.  Please follow-up with your PCP in 2 days for recheck.  Please go to the ER ASAP if you develop any worsening symptoms.  I hope you feel better soon!     ED Prescriptions     Medication Sig Dispense Auth. Provider   naproxen (NAPROSYN) 375 MG tablet Take 1 tablet (375 mg total) by mouth 2 (two) times daily for 7 days. 14 tablet Radford Pax, NP   hydrOXYzine (ATARAX) 25 MG tablet Take 1 tablet (25 mg total) by mouth 2 (two) times daily as needed for itching. 12 tablet Radford Pax, NP   cephALEXin (KEFLEX) 500 MG capsule Take 1 capsule (500 mg total) by mouth 3 (three) times daily for 7 days. 21 capsule Radford Pax, NP      PDMP not reviewed this encounter.   Radford Pax, NP 04/30/23 [phone]

## 2023-05-11 DIAGNOSIS — M5136 Other intervertebral disc degeneration, lumbar region with discogenic back pain only: Secondary | ICD-10-CM | POA: Diagnosis not present

## 2023-05-11 DIAGNOSIS — M50322 Other cervical disc degeneration at C5-C6 level: Secondary | ICD-10-CM | POA: Diagnosis not present

## 2023-05-11 DIAGNOSIS — M9903 Segmental and somatic dysfunction of lumbar region: Secondary | ICD-10-CM | POA: Diagnosis not present

## 2023-05-11 DIAGNOSIS — M9901 Segmental and somatic dysfunction of cervical region: Secondary | ICD-10-CM | POA: Diagnosis not present

## 2023-05-18 DIAGNOSIS — M50322 Other cervical disc degeneration at C5-C6 level: Secondary | ICD-10-CM | POA: Diagnosis not present

## 2023-05-18 DIAGNOSIS — M9903 Segmental and somatic dysfunction of lumbar region: Secondary | ICD-10-CM | POA: Diagnosis not present

## 2023-05-18 DIAGNOSIS — M9901 Segmental and somatic dysfunction of cervical region: Secondary | ICD-10-CM | POA: Diagnosis not present

## 2023-05-18 DIAGNOSIS — M5136 Other intervertebral disc degeneration, lumbar region with discogenic back pain only: Secondary | ICD-10-CM | POA: Diagnosis not present

## 2023-05-25 DIAGNOSIS — M9903 Segmental and somatic dysfunction of lumbar region: Secondary | ICD-10-CM | POA: Diagnosis not present

## 2023-05-25 DIAGNOSIS — M50322 Other cervical disc degeneration at C5-C6 level: Secondary | ICD-10-CM | POA: Diagnosis not present

## 2023-05-25 DIAGNOSIS — M9901 Segmental and somatic dysfunction of cervical region: Secondary | ICD-10-CM | POA: Diagnosis not present

## 2023-05-25 DIAGNOSIS — M5136 Other intervertebral disc degeneration, lumbar region with discogenic back pain only: Secondary | ICD-10-CM | POA: Diagnosis not present

## 2023-06-15 ENCOUNTER — Ambulatory Visit: Payer: Medicare HMO | Admitting: Family Medicine

## 2023-06-15 ENCOUNTER — Encounter: Payer: Self-pay | Admitting: Family Medicine

## 2023-06-15 VITALS — BP 124/60 | HR 77 | Temp 98.3°F | Ht 72.0 in | Wt 231.6 lb

## 2023-06-15 DIAGNOSIS — I10 Essential (primary) hypertension: Secondary | ICD-10-CM

## 2023-06-15 DIAGNOSIS — E782 Mixed hyperlipidemia: Secondary | ICD-10-CM | POA: Diagnosis not present

## 2023-06-15 DIAGNOSIS — E1169 Type 2 diabetes mellitus with other specified complication: Secondary | ICD-10-CM

## 2023-06-15 DIAGNOSIS — E785 Hyperlipidemia, unspecified: Secondary | ICD-10-CM

## 2023-06-15 DIAGNOSIS — Z7984 Long term (current) use of oral hypoglycemic drugs: Secondary | ICD-10-CM

## 2023-06-15 LAB — COMPREHENSIVE METABOLIC PANEL
ALT: 26 U/L (ref 0–53)
AST: 23 U/L (ref 0–37)
Albumin: 3.8 g/dL (ref 3.5–5.2)
Alkaline Phosphatase: 105 U/L (ref 39–117)
BUN: 9 mg/dL (ref 6–23)
CO2: 26 meq/L (ref 19–32)
Calcium: 9.2 mg/dL (ref 8.4–10.5)
Chloride: 103 meq/L (ref 96–112)
Creatinine, Ser: 0.56 mg/dL (ref 0.40–1.50)
GFR: 94.58 mL/min (ref >60.00)
Glucose, Bld: 134 mg/dL — ABNORMAL HIGH (ref 70–99)
Potassium: 3.9 meq/L (ref 3.5–5.1)
Sodium: 138 meq/L (ref 135–145)
Total Bilirubin: 0.8 mg/dL (ref 0.2–1.2)
Total Protein: 6.3 g/dL (ref 6.0–8.3)

## 2023-06-15 LAB — LIPID PANEL
Cholesterol: 129 mg/dL (ref 0–200)
HDL: 39.7 mg/dL (ref >39.00)
LDL Cholesterol: 70 mg/dL (ref 0–99)
NonHDL: 89.6
Total CHOL/HDL Ratio: 3
Triglycerides: 100 mg/dL (ref 0.0–149.0)
VLDL: 20 mg/dL (ref 0.0–40.0)

## 2023-06-15 LAB — HEMOGLOBIN A1C: Hgb A1c MFr Bld: 7.2 % — ABNORMAL HIGH (ref 4.6–6.5)

## 2023-06-15 MED ORDER — METFORMIN HCL ER 750 MG PO TB24
1500.0000 mg | ORAL_TABLET | Freq: Every day | ORAL | 1 refills | Status: DC
Start: 2023-06-15 — End: 2023-11-29

## 2023-06-15 NOTE — Patient Instructions (Addendum)
Try changing metformin to the extended release formulation once per day and we will lower the dose slightly to 1500 mg total per day.  If the soft stools or diarrhea did not improve, let me know and we can make further changes.  Recheck labs in 3 months, but I will check labs today.  No other med changes at this time.   Take care.

## 2023-06-15 NOTE — Progress Notes (Signed)
Subjective:  Patient ID: Christopher Bauer, male    DOB: 1945-12-17  Age: 77 y.o. MRN: [id number]  CC:  Chief Complaint  Patient presents with   Medical Management of Chronic Issues    Pt is doing well no questions or concerns     HPI Oras Splitt presents for  Follow up. No new health concerns. 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   Diabetes: Complicated by microalbuminuria, hyperlipidemia treated with metformin 1000 mg twice daily. He is on ACE inhibitor and statin, denies any side effects with meds, diarrhea in the past thought to be due to diet. Last month and a half - softer stools, diarrhea at times.  No regular home readings but no symptoms of low blood sugars. Microalbumin: 5.4 on 12/14/2022 Optho, foot exam, pneumovax: Pneumovax given last visit.  Up-to-date.  Lab Results  Component Value Date   HGBA1C 6.9 (H) 12/14/2022   HGBA1C 7.0 (H) 05/23/2022   HGBA1C 7.2 (A) 11/15/2021   Lab Results  Component Value Date   MICROALBUR 5.4 (H) 12/14/2022   LDLCALC 65 12/14/2022   CREATININE 0.69 12/14/2022   Hypertension: With history of coronary artery calcifications, on lisinopril 40 mg daily, carvedilol 6.25 mg twice daily followed by cardiology, Dr. Jacinto Halim with as needed follow-up.  Aspirin 81 mg daily without new bleeding or side effects of meds. Home readings: none.  BP Readings from Last 3 Encounters:  06/15/23 124/60  04/30/23 (!) 153/78  12/14/22 136/74   Lab Results  Component Value Date   CREATININE 0.69 12/14/2022   Hyperlipidemia: Lipitor 40 mg daily without new side effects or myalgias. Lab Results  Component Value Date   CHOL 150 12/14/2022   HDL 50.20 12/14/2022   LDLCALC 65 12/14/2022   TRIG 172.0 (H) 12/14/2022   CHOLHDL 3 12/14/2022   Lab Results  Component Value Date   ALT 24 12/14/2022   AST 22 12/14/2022   ALKPHOS 114 12/14/2022   BILITOT 0.9 12/14/2022        History Patient Active Problem List   Diagnosis Date  Noted   Basal cell carcinoma of face 12/22/2021   Former smoker 08/24/2016   Coronary artery calcification 02/04/2016   Abnormal liver function test 10/15/2015   Abnormal CXR 05/01/2014   Diabetes mellitus, type II (HCC) 04/13/2012   High cholesterol 04/13/2012   Osteoarthritis of both knees 04/13/2012   Past Medical History:  Diagnosis Date   Arthritis    Diabetes mellitus without complication (HCC)    Glaucoma    Hyperlipidemia    Hypertension    Prostatitis    Past Surgical History:  Procedure Laterality Date   APPENDECTOMY     Allergies  Allergen Reactions   Sulfa Antibiotics Itching   Prior to Admission medications   Medication Sig Start Date End Date Taking? Authorizing Provider  aspirin 81 MG tablet Take 81 mg by mouth daily.   Yes [provider]  atorvastatin (LIPITOR) 40 MG tablet Take 1 tablet (40 mg total) by mouth daily. 12/14/22  Yes Shade Flood, MD  carvedilol (COREG) 6.25 MG tablet Take 1 tablet (6.25 mg total) by mouth 2 (two) times daily with a meal. 12/14/22  Yes Shade Flood, MD  hydrOXYzine (ATARAX) 25 MG tablet Take 1 tablet (25 mg total) by mouth 2 (two) times daily as needed for itching. 04/30/23  Yes Radford Pax, NP  lisinopril (ZESTRIL) 40 MG tablet Take 1 tablet (40 mg total) by mouth daily.  12/14/22  Yes Shade Flood, MD  lisinopril-hydrochlorothiazide (ZESTORETIC) 20-12.5 MG tablet    Yes [provider]  metFORMIN (GLUCOPHAGE) 1000 MG tablet Take 1 tablet (1,000 mg total) by mouth 2 (two) times daily with a meal. 12/14/22  Yes Shade Flood, MD   Social History   Socioeconomic History   Marital status: Divorced    Spouse name: Not on file   Number of children: 2   Years of education: Not on file   Highest education level: 12th grade  Occupational History   Not on file  Tobacco Use   Smoking status: Former    Current packs/day: 0.00    Average packs/day: 0.3 packs/day for 50.0 years (12.5 ttl pk-yrs)     Types: Cigarettes    Start date: 03/18/1966    Quit date: 03/18/2016    Years since quitting: 7.2   Smokeless tobacco: Never  Vaping Use   Vaping status: Never Used  Substance and Sexual Activity   Alcohol use: Yes    Alcohol/week: 0.0 standard drinks of alcohol    Comment: 4 beers once a week    Drug use: No   Sexual activity: Not Currently  Other Topics Concern   Not on file  Social History Narrative   Not on file   Social Determinants of Health   Financial Resource Strain: Low Risk  (06/11/2023)   Overall Financial Resource Strain (CARDIA)    Difficulty of Paying Living Expenses: Not hard at all  Food Insecurity: No Food Insecurity (06/11/2023)   Hunger Vital Sign    Worried About Running Out of Food in the Last Year: Never true    Ran Out of Food in the Last Year: Never true  Transportation Needs: No Transportation Needs (06/11/2023)   PRAPARE - Administrator, Civil Service (Medical): No    Lack of Transportation (Non-Medical): No  Physical Activity: Insufficiently Active (06/11/2023)   Exercise Vital Sign    Days of Exercise per Week: 1 day    Minutes of Exercise per Session: 10 min  Stress: No Stress Concern Present (06/11/2023)   [employer] of Occupational Health - Occupational Stress Questionnaire    Feeling of Stress : Only a little  Social Connections: Socially Isolated (06/11/2023)   Social Connection and Isolation Panel [NHANES]    Frequency of Communication with Friends and Family: Never    Frequency of Social Gatherings with Friends and Family: Three times a week    Attends Religious Services: Never    Active Member of Clubs or Organizations: No    Attends Banker Meetings: Never    Marital Status: Divorced  Catering manager Violence: Not At Risk (03/30/2023)   Humiliation, Afraid, Rape, and Kick questionnaire    Fear of Current or Ex-Partner: No    Emotionally Abused: No    Physically Abused: No    Sexually Abused: No    Review  of Systems  Constitutional:  Negative for fatigue and unexpected weight change.  Eyes:  Negative for visual disturbance.  Respiratory:  Negative for cough, chest tightness and shortness of breath.   Cardiovascular:  Negative for chest pain, palpitations and leg swelling.  Gastrointestinal:  Positive for diarrhea (loose stools as above.). Negative for abdominal pain and blood in stool.  Neurological:  Negative for dizziness, light-headedness and headaches.     Objective:   Vitals:   06/15/23 1015  BP: 124/60  Pulse: 77  Temp: 98.3 F (36.8 C)  TempSrc: Temporal  SpO2: 97%  Weight: 231 lb 9.6 oz (105.1 kg)  Height: 6' (1.829 m)     Physical Exam Vitals reviewed.  Constitutional:      Appearance: He is well-developed.  HENT:     Head: Normocephalic and atraumatic.  Neck:     Vascular: No carotid bruit or JVD.  Cardiovascular:     Rate and Rhythm: Normal rate and regular rhythm.     Heart sounds: Normal heart sounds. No murmur heard. Pulmonary:     Effort: Pulmonary effort is normal.     Breath sounds: Normal breath sounds. No rales.  Musculoskeletal:     Right lower leg: No edema.     Left lower leg: No edema.  Skin:    General: Skin is warm and dry.  Neurological:     Mental Status: He is alert and oriented to person, place, and time.  Psychiatric:        Mood and Affect: Mood normal.        Assessment & Plan:  Trevion Baser is a 77 y.o. male . DM type 2 with diabetic mixed hyperlipidemia (HCC) - Plan: metFORMIN (GLUCOPHAGE-XR) 750 MG 24 hr tablet, Hemoglobin A1c  -Loose stools, diarrhea likely related to metformin.  Will try changing to extended release formulation and lower dose slightly to a total of 1500 mg daily based on his last A1c being well-controlled.  Will check A1c today and again in 3 months.  If diarrhea or loose stools persist we will need to lower dose metformin further and likely add other agent.  RTC precautions given.  Essential hypertension -  Plan: Comprehensive metabolic panel  -Stable, tolerating current regimen, no changes for now, check labs above.  Adjust plan accordingly  Hyperlipidemia, unspecified hyperlipidemia type - Plan: Comprehensive metabolic panel, Lipid panel  -Tolerating current dose statin, denies chest pain or dyspnea, or other cardiac symptoms.  Tolerating current regimen, continue same and check labs above.  Adjust plan accordingly.  Meds ordered this encounter  Medications   metFORMIN (GLUCOPHAGE-XR) 750 MG 24 hr tablet    Sig: Take 2 tablets (1,500 mg total) by mouth daily.    Dispense:  180 tablet    Refill:  1   Patient Instructions  Try changing metformin to the extended release formulation once per day and we will lower the dose slightly to 1500 mg total per day.  If the soft stools or diarrhea did not improve, let me know and we can make further changes.  Recheck labs in 3 months, but I will check labs today.  No other med changes at this time.   Take care.     Signed,   Meredith Staggers, MD [facility], [facility] 06/15/23 10:59 AM

## 2023-09-04 ENCOUNTER — Other Ambulatory Visit: Payer: Self-pay | Admitting: Family Medicine

## 2023-09-04 DIAGNOSIS — E785 Hyperlipidemia, unspecified: Secondary | ICD-10-CM

## 2023-09-04 DIAGNOSIS — I10 Essential (primary) hypertension: Secondary | ICD-10-CM

## 2023-09-04 DIAGNOSIS — I2510 Atherosclerotic heart disease of native coronary artery without angina pectoris: Secondary | ICD-10-CM

## 2023-09-15 ENCOUNTER — Ambulatory Visit: Payer: Medicare HMO | Admitting: Family Medicine

## 2023-09-19 ENCOUNTER — Telehealth: Payer: Self-pay | Admitting: Family Medicine

## 2023-09-19 DIAGNOSIS — E1169 Type 2 diabetes mellitus with other specified complication: Secondary | ICD-10-CM

## 2023-09-19 NOTE — Telephone Encounter (Signed)
 Appointment is on Friday.  A1c and CMP ordered if he would like to have that performed at [facility] or lab only visit in the next day or 2.

## 2023-09-19 NOTE — Telephone Encounter (Signed)
 Called Patient to discuss note for labs per Dr.Greene. Patient stated he did not call in for labs to be placed. Patient was able to tell me that he had an appointment on 09/22/2023 and usually at that appointment Dr.Greene will grab his labs at the end of the appointment. Patient is not sure if he hit a button on accident requesting these labs when he was in [place] earlier today or if it was Dr.Greene requesting he have these labs done prior due to recent change in medication. Patient stated best way to reach him is through [place]. I did look through his chart but was not able find any other request or notes about labs. Not sure what happened with this phone encounter? Patient told me to update him on whatever Dr.Greene recommends for him to do

## 2023-09-19 NOTE — Telephone Encounter (Signed)
 Pt is requesting labs for tomorrows appointment. Please advise, Thanks

## 2023-09-22 ENCOUNTER — Encounter: Payer: Self-pay | Admitting: Family Medicine

## 2023-09-22 ENCOUNTER — Ambulatory Visit: Payer: Medicare HMO | Admitting: Family Medicine

## 2023-09-22 VITALS — BP 124/64 | HR 70 | Temp 97.9°F | Ht 72.0 in | Wt 226.0 lb

## 2023-09-22 DIAGNOSIS — E785 Hyperlipidemia, unspecified: Secondary | ICD-10-CM | POA: Diagnosis not present

## 2023-09-22 DIAGNOSIS — E1169 Type 2 diabetes mellitus with other specified complication: Secondary | ICD-10-CM

## 2023-09-22 DIAGNOSIS — I2510 Atherosclerotic heart disease of native coronary artery without angina pectoris: Secondary | ICD-10-CM | POA: Diagnosis not present

## 2023-09-22 DIAGNOSIS — I10 Essential (primary) hypertension: Secondary | ICD-10-CM

## 2023-09-22 DIAGNOSIS — E782 Mixed hyperlipidemia: Secondary | ICD-10-CM | POA: Diagnosis not present

## 2023-09-22 DIAGNOSIS — Z7984 Long term (current) use of oral hypoglycemic drugs: Secondary | ICD-10-CM | POA: Diagnosis not present

## 2023-09-22 LAB — COMPREHENSIVE METABOLIC PANEL
ALT: 14 U/L (ref 0–53)
AST: 17 U/L (ref 0–37)
Albumin: 3.9 g/dL (ref 3.5–5.2)
Alkaline Phosphatase: 134 U/L — ABNORMAL HIGH (ref 39–117)
BUN: 12 mg/dL (ref 6–23)
CO2: 26 meq/L (ref 19–32)
Calcium: 9.3 mg/dL (ref 8.4–10.5)
Chloride: 105 meq/L (ref 96–112)
Creatinine, Ser: 0.7 mg/dL (ref 0.40–1.50)
GFR: 88.25 mL/min (ref >60.00)
Glucose, Bld: 150 mg/dL — ABNORMAL HIGH (ref 70–99)
Potassium: 4.4 meq/L (ref 3.5–5.1)
Sodium: 140 meq/L (ref 135–145)
Total Bilirubin: 0.6 mg/dL (ref 0.2–1.2)
Total Protein: 6.3 g/dL (ref 6.0–8.3)

## 2023-09-22 LAB — HEMOGLOBIN A1C: Hgb A1c MFr Bld: 7.5 % — ABNORMAL HIGH (ref 4.6–6.5)

## 2023-09-22 MED ORDER — LISINOPRIL 40 MG PO TABS: 40.0000 mg | ORAL_TABLET | Freq: Every day | ORAL | 1 refills | Status: DC

## 2023-09-22 MED ORDER — ATORVASTATIN CALCIUM 40 MG PO TABS: 40.0000 mg | ORAL_TABLET | Freq: Every day | ORAL | 1 refills | Status: DC

## 2023-09-22 MED ORDER — CARVEDILOL 6.25 MG PO TABS: 6.2500 mg | ORAL_TABLET | Freq: Two times a day (BID) | ORAL | 1 refills | Status: DC

## 2023-09-22 NOTE — Progress Notes (Signed)
 Subjective:  Patient ID: Christopher Bauer, male    DOB: 03-20-1946  Age: 78 y.o. MRN: [id number]  CC:  Chief Complaint  Patient presents with   Medical Management of Chronic Issues    Patient is doing well, no concerns from the patient, notes the new XR Metformin med has been working well no side effects    Health Maintenance    Patient does not want to have shingles and has not had COVID-19 vaccine in 2024     HPI Christopher Bauer presents for   Diabetes: Complicated by microalbuminuria, hyperlipidemia.  Treated with metformin.  Gastrointestinal side effects discussed at his last visit in December, changed to XR formulation.  2000 mg total per day.  This has been helpful with no new side effects. More solid stools.  No regular home blood sugar readings, but denies symptoms of low blood sugar. Less beer. Cut back. 6 per week.  Weight improved by 5lb.  No home readings.  Microalbumin: 5.4 on 12/14/2022.  He is on ACE inhibitor as well as statin. Optho, foot exam, pneumovax: Up-to-date.  Wt Readings from Last 3 Encounters:  09/22/23 226 lb (102.5 kg)  06/15/23 231 lb 9.6 oz (105.1 kg)  04/30/23 240 lb (108.9 kg)     Lab Results  Component Value Date   HGBA1C 7.2 (H) 06/15/2023   HGBA1C 6.9 (H) 12/14/2022   HGBA1C 7.0 (H) 05/23/2022   Lab Results  Component Value Date   MICROALBUR 5.4 (H) 12/14/2022   LDLCALC 70 06/15/2023   CREATININE 0.56 06/15/2023   Hypertension, hyperlipidemia discussed at his December visit, continued on lisinopril, carvedilol, aspirin, Lipitor for statin. No new side effects.   Covid booster and shingle vaccine declined.    History Patient Active Problem List   Diagnosis Date Noted   Osteoarthritis of left knee 02/02/2022   Pain in joint of left knee 12/23/2021   Basal cell carcinoma of face 12/22/2021   Acquired trigger finger of left ring finger 03/03/2020   Former smoker 08/24/2016   Coronary artery calcification 02/04/2016   Abnormal liver  function test 10/15/2015   LFT elevation 10/15/2015   Abnormal CXR 05/01/2014   Diabetes mellitus, type II (HCC) 04/13/2012   High cholesterol 04/13/2012   Osteoarthritis of both knees 04/13/2012   Past Medical History:  Diagnosis Date   Arthritis    Diabetes mellitus without complication (HCC)    Glaucoma    Hyperlipidemia    Hypertension    Prostatitis    Past Surgical History:  Procedure Laterality Date   APPENDECTOMY     Allergies  Allergen Reactions   Sulfa Antibiotics Itching   Prior to Admission medications   Medication Sig Start Date End Date Taking? Authorizing Provider  aspirin 81 MG tablet Take 81 mg by mouth daily.   Yes [provider]  atorvastatin (LIPITOR) 40 MG tablet Take 1 tablet (40 mg total) by mouth daily. 12/14/22  Yes Shade Flood, MD  carvedilol (COREG) 6.25 MG tablet Take 1 tablet (6.25 mg total) by mouth 2 (two) times daily with a meal. 12/14/22  Yes Shade Flood, MD  lisinopril (ZESTRIL) 40 MG tablet Take 1 tablet (40 mg total) by mouth daily. 12/14/22  Yes Shade Flood, MD  metFORMIN (GLUCOPHAGE-XR) 750 MG 24 hr tablet Take 2 tablets (1,500 mg total) by mouth daily. 06/15/23  Yes Shade Flood, MD   Social History   Socioeconomic History   Marital status: Divorced    Spouse name:  Not on file   Number of children: 2   Years of education: Not on file   Highest education level: 12th grade  Occupational History   Not on file  Tobacco Use   Smoking status: Former    Current packs/day: 0.00    Average packs/day: 0.3 packs/day for 50.0 years (12.5 ttl pk-yrs)    Types: Cigarettes    Start date: 03/18/1966    Quit date: 03/18/2016    Years since quitting: 7.5   Smokeless tobacco: Never  Vaping Use   Vaping status: Never Used  Substance and Sexual Activity   Alcohol use: Yes    Alcohol/week: 0.0 standard drinks of alcohol    Comment: 4 beers once a week    Drug use: No   Sexual activity: Not Currently  Other Topics  Concern   Not on file  Social History Narrative   Not on file   Social Drivers of Health   Financial Resource Strain: Low Risk  (09/18/2023)   Overall Financial Resource Strain (CARDIA)    Difficulty of Paying Living Expenses: Not hard at all  Food Insecurity: No Food Insecurity (09/18/2023)   Hunger Vital Sign    Worried About Running Out of Food in the Last Year: Never true    Ran Out of Food in the Last Year: Never true  Transportation Needs: No Transportation Needs (09/18/2023)   PRAPARE - Administrator, Civil Service (Medical): No    Lack of Transportation (Non-Medical): No  Physical Activity: Insufficiently Active (09/18/2023)   Exercise Vital Sign    Days of Exercise per Week: 1 day    Minutes of Exercise per Session: 10 min  Stress: No Stress Concern Present (09/18/2023)   [employer] of Occupational Health - Occupational Stress Questionnaire    Feeling of Stress : Not at all  Social Connections: Moderately Isolated (09/18/2023)   Social Connection and Isolation Panel [NHANES]    Frequency of Communication with Friends and Family: Once a week    Frequency of Social Gatherings with Friends and Family: Twice a week    Attends Religious Services: Never    Database administrator or Organizations: No    Attends Banker Meetings: Never    Marital Status: Living with partner  Intimate Partner Violence: Not At Risk (03/30/2023)   Humiliation, Afraid, Rape, and Kick questionnaire    Fear of Current or Ex-Partner: No    Emotionally Abused: No    Physically Abused: No    Sexually Abused: No    Review of Systems  Constitutional:  Negative for fatigue and unexpected weight change.  Eyes:  Negative for visual disturbance.  Respiratory:  Negative for cough, chest tightness and shortness of breath.   Cardiovascular:  Negative for chest pain, palpitations and leg swelling.  Gastrointestinal:  Negative for abdominal pain and blood in stool.  Neurological:   Negative for dizziness, light-headedness and headaches.   Per HPI  Objective:   Vitals:   09/22/23 0816  BP: 124/64  Pulse: 70  Temp: 97.9 F (36.6 C)  TempSrc: Temporal  SpO2: 98%  Weight: 226 lb (102.5 kg)  Height: 6' (1.829 m)     Physical Exam Vitals reviewed.  Constitutional:      Appearance: He is well-developed.  HENT:     Head: Normocephalic and atraumatic.  Neck:     Vascular: No carotid bruit or JVD.  Cardiovascular:     Rate and Rhythm: Normal rate and regular rhythm.  Heart sounds: Normal heart sounds. No murmur heard. Pulmonary:     Effort: Pulmonary effort is normal.     Breath sounds: Normal breath sounds. No rales.  Musculoskeletal:     Right lower leg: No edema.     Left lower leg: No edema.  Skin:    General: Skin is warm and dry.  Neurological:     Mental Status: He is alert and oriented to person, place, and time.  Psychiatric:        Mood and Affect: Mood normal.     Assessment & Plan:  Christopher Bauer is a 78 y.o. male . DM type 2 with diabetic mixed hyperlipidemia (HCC) - Plan: Comprehensive metabolic panel, Hemoglobin A1c  -Improved tolerance with metformin extended release.  Continue same, check A1c and adjust plan accordingly.  Commended on decreased alcohol use and weight loss.  Hyperlipidemia, unspecified hyperlipidemia type - Plan: atorvastatin (LIPITOR) 40 MG tablet  -Tolerating current statin, labs last visit, repeat lipids in 3 months.  Coronary artery disease involving native heart without angina pectoris, unspecified vessel or lesion type - Plan: atorvastatin (LIPITOR) 40 MG tablet Essential hypertension - Plan: carvedilol (COREG) 6.25 MG tablet, lisinopril (ZESTRIL) 40 MG tablet  -Asymptomatic, tolerating current med regimen.  Continue same.  Meds ordered this encounter  Medications   atorvastatin (LIPITOR) 40 MG tablet    Sig: Take 1 tablet (40 mg total) by mouth daily.    Dispense:  90 tablet    Refill:  1   carvedilol  (COREG) 6.25 MG tablet    Sig: Take 1 tablet (6.25 mg total) by mouth 2 (two) times daily with a meal.    Dispense:  180 tablet    Refill:  1   lisinopril (ZESTRIL) 40 MG tablet    Sig: Take 1 tablet (40 mg total) by mouth daily.    Dispense:  90 tablet    Refill:  1   Patient Instructions  Great work on the weight loss.  Glad to hear the news of extended release formulation of metformin has been better tolerated.  No change in meds for now.  I suspect the A1c will look better with the weight loss.  Continue other medications and I will let you know if any concerns on labs.  Take care!    Signed,   Meredith Staggers, MD [facility], [facility] 09/22/23 8:55 AM

## 2023-09-22 NOTE — Patient Instructions (Addendum)
 Great work on the weight loss.  Glad to hear the news of extended release formulation of metformin has been better tolerated.  No change in meds for now.  I suspect the A1c will look better with the weight loss.  Continue other medications and I will let you know if any concerns on labs.  Take care!

## 2023-09-26 ENCOUNTER — Encounter: Payer: Self-pay | Admitting: Family Medicine

## 2023-11-29 ENCOUNTER — Other Ambulatory Visit: Payer: Self-pay

## 2023-11-29 ENCOUNTER — Other Ambulatory Visit: Payer: Self-pay | Admitting: Family Medicine

## 2023-11-29 DIAGNOSIS — E1169 Type 2 diabetes mellitus with other specified complication: Secondary | ICD-10-CM

## 2023-11-29 MED ORDER — METFORMIN HCL ER 750 MG PO TB24: 1500.0000 mg | ORAL_TABLET | Freq: Every day | ORAL | 1 refills | Status: DC

## 2024-01-04 ENCOUNTER — Ambulatory Visit: Admitting: Family Medicine

## 2024-01-04 ENCOUNTER — Encounter: Payer: Self-pay | Admitting: Family Medicine

## 2024-01-04 VITALS — BP 118/74 | HR 72 | Temp 98.9°F | Resp 16 | Ht 72.0 in | Wt 232.4 lb

## 2024-01-04 DIAGNOSIS — E782 Mixed hyperlipidemia: Secondary | ICD-10-CM

## 2024-01-04 DIAGNOSIS — I10 Essential (primary) hypertension: Secondary | ICD-10-CM | POA: Diagnosis not present

## 2024-01-04 DIAGNOSIS — E1169 Type 2 diabetes mellitus with other specified complication: Secondary | ICD-10-CM

## 2024-01-04 DIAGNOSIS — Z7984 Long term (current) use of oral hypoglycemic drugs: Secondary | ICD-10-CM | POA: Diagnosis not present

## 2024-01-04 DIAGNOSIS — E785 Hyperlipidemia, unspecified: Secondary | ICD-10-CM

## 2024-01-04 DIAGNOSIS — I2510 Atherosclerotic heart disease of native coronary artery without angina pectoris: Secondary | ICD-10-CM | POA: Diagnosis not present

## 2024-01-04 LAB — COMPREHENSIVE METABOLIC PANEL WITH GFR
ALT: 25 U/L (ref 0–53)
AST: 23 U/L (ref 0–37)
Albumin: 4.1 g/dL (ref 3.5–5.2)
Alkaline Phosphatase: 132 U/L — ABNORMAL HIGH (ref 39–117)
BUN: 15 mg/dL (ref 6–23)
CO2: 28 meq/L (ref 19–32)
Calcium: 9.4 mg/dL (ref 8.4–10.5)
Chloride: 103 meq/L (ref 96–112)
Creatinine, Ser: 0.71 mg/dL (ref 0.40–1.50)
GFR: 87.7 mL/min (ref >60.00)
Glucose, Bld: 176 mg/dL — ABNORMAL HIGH (ref 70–99)
Potassium: 4.9 meq/L (ref 3.5–5.1)
Sodium: 138 meq/L (ref 135–145)
Total Bilirubin: 1.1 mg/dL (ref 0.2–1.2)
Total Protein: 6.3 g/dL (ref 6.0–8.3)

## 2024-01-04 LAB — LIPID PANEL
Cholesterol: 159 mg/dL (ref 0–200)
HDL: 42.8 mg/dL (ref >39.00)
LDL Cholesterol: 87 mg/dL (ref 0–99)
NonHDL: 116.01
Total CHOL/HDL Ratio: 4
Triglycerides: 143 mg/dL (ref 0.0–149.0)
VLDL: 28.6 mg/dL (ref 0.0–40.0)

## 2024-01-04 LAB — MICROALBUMIN / CREATININE URINE RATIO
Creatinine,U: 203.1 mg/dL
Microalb Creat Ratio: 37.8 mg/g — ABNORMAL HIGH (ref 0.0–30.0)
Microalb, Ur: 7.7 mg/dL — ABNORMAL HIGH (ref 0.0–1.9)

## 2024-01-04 LAB — HEMOGLOBIN A1C: Hgb A1c MFr Bld: 7.7 % — ABNORMAL HIGH (ref 4.6–6.5)

## 2024-01-04 MED ORDER — METFORMIN HCL ER 750 MG PO TB24: 1500.0000 mg | ORAL_TABLET | Freq: Every day | ORAL | 1 refills | Status: DC

## 2024-01-04 MED ORDER — ATORVASTATIN CALCIUM 40 MG PO TABS: 40.0000 mg | ORAL_TABLET | Freq: Every day | ORAL | 1 refills | Status: DC

## 2024-01-04 MED ORDER — LISINOPRIL 40 MG PO TABS: 40.0000 mg | ORAL_TABLET | Freq: Every day | ORAL | 1 refills | Status: DC

## 2024-01-04 MED ORDER — CARVEDILOL 6.25 MG PO TABS: 6.2500 mg | ORAL_TABLET | Freq: Two times a day (BID) | ORAL | 1 refills | Status: DC

## 2024-01-04 NOTE — Patient Instructions (Signed)
 Thanks for coming in today.  No medication changes at this time.  Extended release metformin  seems to be better tolerated as far as the gastrointestinal side effects.  Continue to take 2 pills all at once, once per day.  If any concerns on labs I will let you know.  As long as A1c is under 7.5 can follow-up in 6 months for physical.  Take care!

## 2024-01-04 NOTE — Progress Notes (Signed)
 Subjective:  Patient ID: Christopher Bauer, male    DOB: [birth year]  Age: 78 y.o. MRN: [id number]  CC:  Chief Complaint  Patient presents with   Medical Management of Chronic Issues    Pt is doing okay, would like to know when/ how to take his metformin  he has been taking 2 tablets in the morning.  Also wonders why he is on a 3 month follow up schedule     HPI Christopher Bauer presents for   Diabetes: Complicated by microalbuminuria, hyperlipidemia. Metformin  XR formulation in place of immediate release in December for improved GI tolerance.  The side effects improved at his March visit.  Still taking 2/day of 750mg  dose.  No diarrhea, or new side effects with meds.  No home readings, no symptoms of low blood sugar.  They had improved last visit, up again today.  He had cut back on beer to 6/week at his last visit. Now 6-9 beers per week. New job - more time for lunch.   Microalbumin: 5.4 on 12/14/2022, he is on ACE inhibitor and statin. Optho, foot exam, pneumovax:  Optho - saw Dr. Octavia in January - record requested.  Shingrix, COVID booster declined.  Wt Readings from Last 3 Encounters:  01/04/24 232 lb 6.4 oz (105.4 kg)  09/22/23 226 lb (102.5 kg)  06/15/23 231 lb 9.6 oz (105.1 kg)    Lab Results  Component Value Date   HGBA1C 7.5 (H) 09/22/2023   HGBA1C 7.2 (H) 06/15/2023   HGBA1C 6.9 (H) 12/14/2022   Lab Results  Component Value Date   MICROALBUR 5.4 (H) 12/14/2022   LDLCALC 70 06/15/2023   CREATININE 0.70 09/22/2023   Hypertension: Carvedilol  6.25 mg twice daily, lisinopril  40 mg daily. Home readings: none.  No side effects.  BP Readings from Last 3 Encounters:  01/04/24 118/74  09/22/23 124/64  06/15/23 124/60   Lab Results  Component Value Date   CREATININE 0.70 09/22/2023    Hyperlipidemia: With history of coronary artery calcifications, treated with Lipitor 40 mg daily.  He is also on aspirin 81 mg daily. No new bruising/bleeding.  Lab Results  Component  Value Date   CHOL 129 06/15/2023   HDL 39.70 06/15/2023   LDLCALC 70 06/15/2023   TRIG 100.0 06/15/2023   CHOLHDL 3 06/15/2023   Lab Results  Component Value Date   ALT 14 09/22/2023   AST 17 09/22/2023   ALKPHOS 134 (H) 09/22/2023   BILITOT 0.6 09/22/2023    History Patient Active Problem List   Diagnosis Date Noted   Pain in joint of left knee 12/23/2021   Diabetes mellitus, type II (HCC) 04/13/2012   High cholesterol 04/13/2012   Osteoarthritis of both knees 04/13/2012   Past Medical History:  Diagnosis Date   Arthritis    Diabetes mellitus without complication (HCC)    Glaucoma    Hyperlipidemia    Hypertension    Prostatitis    Past Surgical History:  Procedure Laterality Date   APPENDECTOMY     Allergies  Allergen Reactions   Sulfa  Antibiotics Itching   Prior to Admission medications   Medication Sig Start Date End Date Taking? Authorizing Provider  aspirin 81 MG tablet Take 81 mg by mouth daily.   Yes [provider]  atorvastatin  (LIPITOR) 40 MG tablet Take 1 tablet (40 mg total) by mouth daily. 09/22/23  Yes Levora Reyes SAUNDERS, MD  carvedilol  (COREG ) 6.25 MG tablet Take 1 tablet (6.25 mg total) by mouth 2 (two) times  daily with a meal. 09/22/23  Yes Levora Reyes SAUNDERS, MD  lisinopril  (ZESTRIL ) 40 MG tablet Take 1 tablet (40 mg total) by mouth daily. 09/22/23  Yes Levora Reyes SAUNDERS, MD  metFORMIN  (GLUCOPHAGE -XR) 750 MG 24 hr tablet TAKE 2 TABLETS DAILY 11/29/23  Yes Levora Reyes SAUNDERS, MD  metFORMIN  (GLUCOPHAGE -XR) 750 MG 24 hr tablet Take 2 tablets (1,500 mg total) by mouth daily. 11/29/23  Yes Levora Reyes SAUNDERS, MD   Social History   Socioeconomic History   Marital status: Divorced    Spouse name: Not on file   Number of children: 2   Years of education: Not on file   Highest education level: 12th grade  Occupational History   Not on file  Tobacco Use   Smoking status: Former    Current packs/day: 0.00    Average packs/day: 0.3 packs/day for  50.0 years (12.5 ttl pk-yrs)    Types: Cigarettes    Start date: 03/18/1966    Quit date: 03/18/2016    Years since quitting: 7.8   Smokeless tobacco: Never  Vaping Use   Vaping status: Never Used  Substance and Sexual Activity   Alcohol use: Yes    Alcohol/week: 0.0 standard drinks of alcohol    Comment: 4 beers once a week    Drug use: No   Sexual activity: Not Currently  Other Topics Concern   Not on file  Social History Narrative   Not on file   Social Drivers of Health   Financial Resource Strain: Low Risk  (12/28/2023)   Overall Financial Resource Strain (CARDIA)    Difficulty of Paying Living Expenses: Not hard at all  Food Insecurity: No Food Insecurity (12/28/2023)   Hunger Vital Sign    Worried About Running Out of Food in the Last Year: Never true    Ran Out of Food in the Last Year: Never true  Transportation Needs: No Transportation Needs (12/28/2023)   PRAPARE - Administrator, Civil Service (Medical): No    Lack of Transportation (Non-Medical): No  Physical Activity: Insufficiently Active (12/28/2023)   Exercise Vital Sign    Days of Exercise per Week: 3 days    Minutes of Exercise per Session: 30 min  Stress: No Stress Concern Present (12/28/2023)   [employer] of Occupational Health - Occupational Stress Questionnaire    Feeling of Stress: Not at all  Social Connections: Moderately Isolated (12/28/2023)   Social Connection and Isolation Panel    Frequency of Communication with Friends and Family: Once a week    Frequency of Social Gatherings with Friends and Family: Three times a week    Attends Religious Services: Never    Active Member of Clubs or Organizations: No    Attends Banker Meetings: Not on file    Marital Status: Living with partner  Intimate Partner Violence: Not At Risk (03/30/2023)   Humiliation, Afraid, Rape, and Kick questionnaire    Fear of Current or Ex-Partner: No    Emotionally Abused: No    Physically  Abused: No    Sexually Abused: No    Review of Systems  Constitutional:  Negative for fatigue and unexpected weight change.  Eyes:  Negative for visual disturbance.  Respiratory:  Negative for cough, chest tightness and shortness of breath.   Cardiovascular:  Negative for chest pain, palpitations and leg swelling.  Gastrointestinal:  Negative for abdominal pain and blood in stool.  Neurological:  Negative for dizziness, light-headedness and headaches.  Objective:   Vitals:   01/04/24 0753  BP: 118/74  Pulse: 72  Resp: 16  Temp: 98.9 F (37.2 C)  TempSrc: Temporal  SpO2: 96%  Weight: 232 lb 6.4 oz (105.4 kg)  Height: 6' (1.829 m)     Physical Exam Vitals reviewed.  Constitutional:      Appearance: He is well-developed.  HENT:     Head: Normocephalic and atraumatic.  Neck:     Vascular: No carotid bruit or JVD.   Cardiovascular:     Rate and Rhythm: Normal rate and regular rhythm.     Heart sounds: Normal heart sounds. No murmur heard. Pulmonary:     Effort: Pulmonary effort is normal.     Breath sounds: Normal breath sounds. No rales.   Musculoskeletal:     Right lower leg: No edema.     Left lower leg: No edema.   Skin:    General: Skin is warm and dry.   Neurological:     Mental Status: He is alert and oriented to person, place, and time.   Psychiatric:        Mood and Affect: Mood normal.      Assessment & Plan:  Christopher Bauer is a 78 y.o. male . DM type 2 with diabetic mixed hyperlipidemia (HCC) - Plan: Microalbumin / creatinine urine ratio, Hemoglobin A1c, metFORMIN  (GLUCOPHAGE -XR) 750 MG 24 hr tablet  - Improved tolerance of extended release metformin .  Continue same.  Check A1c.  This has been trending upwards.  If elevated may need to adjust metformin  dose or add additional medication.  Commended on dietary changes.  Recheck in 6 months if stable.  Optho note requested.  Hyperlipidemia, unspecified hyperlipidemia type - Plan: Comprehensive  metabolic panel with GFR, Lipid panel, atorvastatin  (LIPITOR) 40 MG tablet  - Tolerating Lipitor, same dose refilled.  Check labs and adjust plan accordingly  Essential hypertension - Plan: Comprehensive metabolic panel with GFR, carvedilol  (COREG ) 6.25 MG tablet, lisinopril  (ZESTRIL ) 40 MG tablet  - Stable on current regimen without side effects, continue same.  Labs as above.  Coronary artery disease involving native heart without angina pectoris, unspecified vessel or lesion type - Plan: Lipid panel, atorvastatin  (LIPITOR) 40 MG tablet  - Asymptomatic, continue statin, BP control, diabetes control as above.  Tolerating aspirin.  Meds ordered this encounter  Medications   atorvastatin  (LIPITOR) 40 MG tablet    Sig: Take 1 tablet (40 mg total) by mouth daily.    Dispense:  90 tablet    Refill:  1   carvedilol  (COREG ) 6.25 MG tablet    Sig: Take 1 tablet (6.25 mg total) by mouth 2 (two) times daily with a meal.    Dispense:  180 tablet    Refill:  1   lisinopril  (ZESTRIL ) 40 MG tablet    Sig: Take 1 tablet (40 mg total) by mouth daily.    Dispense:  90 tablet    Refill:  1   metFORMIN  (GLUCOPHAGE -XR) 750 MG 24 hr tablet    Sig: Take 2 tablets (1,500 mg total) by mouth daily.    Dispense:  180 tablet    Refill:  1   Patient Instructions  Thanks for coming in today.  No medication changes at this time.  Extended release metformin  seems to be better tolerated as far as the gastrointestinal side effects.  Continue to take 2 pills all at once, once per day.  If any concerns on labs I will let you know.  As  long as A1c is under 7.5 can follow-up in 6 months for physical.  Take care!    Signed,   Reyes Pines, MD [facility], [facility] 01/04/24 8:14 AM

## 2024-01-06 ENCOUNTER — Ambulatory Visit: Payer: Self-pay | Admitting: Family Medicine

## 2024-01-06 DIAGNOSIS — E1169 Type 2 diabetes mellitus with other specified complication: Secondary | ICD-10-CM

## 2024-01-08 NOTE — Telephone Encounter (Signed)
 Patient is okay with the increase of metformin .

## 2024-01-09 MED ORDER — METFORMIN HCL ER 500 MG PO TB24: 2000.0000 mg | ORAL_TABLET | Freq: Every evening | ORAL | 1 refills | Status: DC

## 2024-06-18 ENCOUNTER — Other Ambulatory Visit: Payer: Self-pay | Admitting: Family Medicine

## 2024-06-18 DIAGNOSIS — I2510 Atherosclerotic heart disease of native coronary artery without angina pectoris: Secondary | ICD-10-CM

## 2024-06-18 DIAGNOSIS — E785 Hyperlipidemia, unspecified: Secondary | ICD-10-CM

## 2024-06-18 DIAGNOSIS — I10 Essential (primary) hypertension: Secondary | ICD-10-CM

## 2024-06-18 DIAGNOSIS — E1169 Type 2 diabetes mellitus with other specified complication: Secondary | ICD-10-CM

## 2024-06-27 ENCOUNTER — Encounter: Payer: Self-pay | Admitting: Family Medicine

## 2024-06-27 ENCOUNTER — Ambulatory Visit: Admitting: Family Medicine

## 2024-06-27 VITALS — BP 128/60 | HR 68 | Temp 98.7°F | Resp 18 | Ht 72.0 in | Wt 234.6 lb

## 2024-06-27 DIAGNOSIS — E785 Hyperlipidemia, unspecified: Secondary | ICD-10-CM | POA: Diagnosis not present

## 2024-06-27 DIAGNOSIS — E782 Mixed hyperlipidemia: Secondary | ICD-10-CM | POA: Diagnosis not present

## 2024-06-27 DIAGNOSIS — Z7984 Long term (current) use of oral hypoglycemic drugs: Secondary | ICD-10-CM

## 2024-06-27 DIAGNOSIS — E66811 Obesity, class 1: Secondary | ICD-10-CM

## 2024-06-27 DIAGNOSIS — Z6831 Body mass index (BMI) 31.0-31.9, adult: Secondary | ICD-10-CM | POA: Diagnosis not present

## 2024-06-27 DIAGNOSIS — Z0000 Encounter for general adult medical examination without abnormal findings: Secondary | ICD-10-CM

## 2024-06-27 DIAGNOSIS — I10 Essential (primary) hypertension: Secondary | ICD-10-CM

## 2024-06-27 DIAGNOSIS — Z23 Encounter for immunization: Secondary | ICD-10-CM | POA: Diagnosis not present

## 2024-06-27 DIAGNOSIS — R635 Abnormal weight gain: Secondary | ICD-10-CM

## 2024-06-27 DIAGNOSIS — E1169 Type 2 diabetes mellitus with other specified complication: Secondary | ICD-10-CM | POA: Diagnosis not present

## 2024-06-27 LAB — COMPREHENSIVE METABOLIC PANEL WITH GFR
ALT: 24 U/L (ref 3–53)
AST: 20 U/L (ref 5–37)
Albumin: 4 g/dL (ref 3.5–5.2)
Alkaline Phosphatase: 110 U/L (ref 39–117)
BUN: 14 mg/dL (ref 6–23)
CO2: 25 meq/L (ref 19–32)
Calcium: 9.2 mg/dL (ref 8.4–10.5)
Chloride: 103 meq/L (ref 96–112)
Creatinine, Ser: 0.7 mg/dL (ref 0.40–1.50)
GFR: 87.78 mL/min (ref >60.00)
Glucose, Bld: 171 mg/dL — ABNORMAL HIGH (ref 70–99)
Potassium: 4.4 meq/L (ref 3.5–5.1)
Sodium: 137 meq/L (ref 135–145)
Total Bilirubin: 0.8 mg/dL (ref 0.2–1.2)
Total Protein: 6.4 g/dL (ref 6.0–8.3)

## 2024-06-27 LAB — LIPID PANEL
Cholesterol: 153 mg/dL (ref 28–200)
HDL: 42.4 mg/dL (ref >39.00)
LDL Cholesterol: 83 mg/dL (ref 10–99)
NonHDL: 110.93
Total CHOL/HDL Ratio: 4
Triglycerides: 139 mg/dL (ref 10.0–149.0)
VLDL: 27.8 mg/dL (ref 0.0–40.0)

## 2024-06-27 LAB — TSH: TSH: 1.24 u[IU]/mL (ref 0.35–5.50)

## 2024-06-27 LAB — HEMOGLOBIN A1C: Hgb A1c MFr Bld: 8.5 % — ABNORMAL HIGH (ref 4.6–6.5)

## 2024-06-27 NOTE — Progress Notes (Unsigned)
 Subjective:  Patient ID: Christopher Bauer, male    DOB: [birth year]  Age: 78 y.o. MRN: [id number]  CC:  Chief Complaint  Patient presents with   Annual Exam    Patient reports he is doing well. No questions or concerns.     HPI Christopher Bauer presents for Annual Exam, no acute concerns.   PCP, me Ortho/sports medicine, saw Dr. Delane last year for left knee pain, and has seen Dr. Kay as well. Hand/Ortho, Dr. Shari. Cardiology, Dr. Ladona - no recent appt. Follow up as needed only. Chiropractic, [facility], Dr. Glendia Gaudy - reported visit earlier this year, appt scheduled next year.  Dermatology, Dr. Lynnell, Dr. Jadine with history of basal cell carcinoma of nose in 2023.  Diabetes: Complicated by microalbuminuria, hyperlipidemia.  Metformin  twice daily, on ACE inhibitor and statin.  GI side effects improved on extended release metformin , and at June visit was on 750 mg dose 2/day.  Elevated A1c at June visit with option to increase his extended release metformin  to 1000 mg, 2/day.  He is taking total dose of 2000 mg/day. No side effects.  No home readings.  No sx of lows.  Microalbumin: Ratio 37.8 in June. On ACE inhibitor. Considering sglt2 depending on A1c.  Optho, foot exam, pneumovax:  Diabetic Foot Exam - Simple   Simple Foot Form Diabetic Foot exam was performed with the following findings: Yes 06/27/2024  9:01 AM  Visual Inspection No deformities, no ulcerations, no other skin breakdown bilaterally: Yes Sensation Testing See comments: Yes Pulse Check Posterior Tibialis and Dorsalis pulse intact bilaterally: Yes Comments Intact to touch with monofilament 100% on right foot but left foot big toe and heel patient could  feel it a little.       Lab Results  Component Value Date   HGBA1C 7.7 (H) 01/04/2024   HGBA1C 7.5 (H) 09/22/2023   HGBA1C 7.2 (H) 06/15/2023   Lab Results  Component Value Date   MICROALBUR 7.7 (H) 01/04/2024   LDLCALC 87  01/04/2024   CREATININE 0.71 01/04/2024    Hyperlipidemia: Lipitor 40 mg daily.  History of coronary artery calcifications, aspirin 81 mg daily without new bruising or bleeding.  Has seen cardiology in the past, no recent visit.  Denies chest pain or dyspnea with exertion or new side effects with meds. Lab Results  Component Value Date   CHOL 159 01/04/2024   HDL 42.80 01/04/2024   LDLCALC 87 01/04/2024   TRIG 143.0 01/04/2024   CHOLHDL 4 01/04/2024   Lab Results  Component Value Date   ALT 25 01/04/2024   AST 23 01/04/2024   ALKPHOS 132 (H) 01/04/2024   BILITOT 1.1 01/04/2024    Hypertension: Lisinopril  40 mg daily, carvedilol  6.25 mg twice daily. No side effects.  Home readings:none BP Readings from Last 3 Encounters:  06/27/24 128/60  01/04/24 118/74  09/22/23 124/64   Lab Results  Component Value Date   CREATININE 0.71 01/04/2024        06/27/2024    8:40 AM 01/04/2024    7:56 AM 09/22/2023    8:23 AM 06/15/2023   10:18 AM 03/30/2023    8:36 AM  Depression screen PHQ 2/9  Decreased Interest 0 0 0 0 0  Down, Depressed, Hopeless 0 0 0 0 0  PHQ - 2 Score 0 0 0 0 0  Altered sleeping 0 0 0 0 0  Tired, decreased energy 0 0 0 0 0  Change in appetite 0 0 0 0  0  Feeling bad or failure about yourself  0 0 0 0 0  Trouble concentrating 0 0 0 0 0  Moving slowly or fidgety/restless 0 0 0 0 0  Suicidal thoughts 0 0 0 0 0  PHQ-9 Score 0 0  0  0  0   Difficult doing work/chores Not difficult at all Not difficult at all   Not difficult at all     Data saved with a previous flowsheet row definition    Health Maintenance  Topic Date Due   OPHTHALMOLOGY EXAM  10/27/2023   Medicare Annual Wellness (AWV)  03/29/2024   COVID-19 Vaccine (4 - 2025-26 season) 07/13/2024 (Originally 03/11/2024)   Zoster Vaccines- Shingrix (1 of 2) 09/25/2024 (Originally 03/17/1995)   Lung Cancer Screening  06/27/2025 (Originally 11/25/2016)   HEMOGLOBIN A1C  07/05/2024   Diabetic kidney evaluation  - eGFR measurement  01/03/2025   Diabetic kidney evaluation - Urine ACR  01/03/2025   FOOT EXAM  06/27/2025   DTaP/Tdap/Td (2 - Td or Tdap) 02/16/2027   Pneumococcal Vaccine: 50+ Years  Completed   Influenza Vaccine  Completed   Hepatitis C Screening  Completed   Meningococcal B Vaccine  Aged Out   Fecal DNA (Cologuard)  Discontinued  Cologuard negative in 2021. he has declined further testing.  Immunization History  Administered Date(s) Administered   Fluad Quad(high Dose 65+) 03/01/2019, 05/28/2021, 05/23/2022   INFLUENZA, HIGH DOSE SEASONAL PF 06/27/2024   Influenza Split 04/12/2013   Influenza,inj,Quad PF,6+ Mos 05/25/2016, 02/26/2020   Influenza-Unspecified 04/10/2014, 04/20/2015, 05/11/2018   Moderna Sars-Covid-2 Vaccination 08/26/2019, 09/24/2019   PFIZER(Purple Top)SARS-COV-2 Vaccination 05/05/2020   PNEUMOCOCCAL CONJUGATE-20 12/14/2022   Pneumococcal Conjugate-13 05/01/2014   Pneumococcal Polysaccharide-23 02/04/2016   Tdap 02/15/2017  Shingrix, COVID booster declined. RSV vaccine declined.  Flu vaccine given today.  Denies prior hearing screen - having to repeat at times at during today's visit. Denies hearing issues.   No results found. Optho, Dr. Octavia, reports visit earlier in the year.  Dental: partial - no dentist.   Alcohol: 6 beers per week.   Tobacco: none. Quit 8 yrs ago. Declines lung cancer screening.   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  Wt Readings from Last 3 Encounters:  06/27/24 234 lb 9.6 oz (106.4 kg)  01/04/24 232 lb 6.4 oz (105.4 kg)  09/22/23 226 lb (102.5 kg)   Body mass index is 31.82 kg/m.    History Patient Active Problem List   Diagnosis Date Noted   Pain in joint of left knee 12/23/2021   Diabetes mellitus, type II (HCC) 04/13/2012   High cholesterol 04/13/2012   Osteoarthritis of both knees 04/13/2012   Past Medical  History:  Diagnosis Date   Arthritis    Diabetes mellitus without complication (HCC)    Glaucoma    Hyperlipidemia    Hypertension    Prostatitis    Past Surgical History:  Procedure Laterality Date   APPENDECTOMY     Allergies[1] Prior to Admission medications  Medication Sig Start Date End Date Taking? Authorizing Provider  aspirin 81 MG tablet Take 81 mg by mouth daily.   Yes [provider]  atorvastatin  (LIPITOR) 40 MG tablet TAKE 1 TABLET DAILY 06/18/24  Yes Levora Reyes SAUNDERS, MD  carvedilol  (COREG ) 6.25 MG tablet TAKE 1 TABLET TWICE DAILY  WITH MEALS 06/18/24  Yes Levora Reyes SAUNDERS, MD  lisinopril  (ZESTRIL ) 40 MG tablet  TAKE 1 TABLET DAILY 06/18/24  Yes Levora Reyes SAUNDERS, MD  metFORMIN  (GLUCOPHAGE -XR) 500 MG 24 hr tablet TAKE 4 TABLETS EVERY       EVENING 06/18/24  Yes Levora Reyes SAUNDERS, MD   Social History   Socioeconomic History   Marital status: Divorced    Spouse name: Not on file   Number of children: 2   Years of education: Not on file   Highest education level: 12th grade  Occupational History   Not on file  Tobacco Use   Smoking status: Former    Current packs/day: 0.00    Average packs/day: 0.6 packs/day for 87.5 years (50.0 ttl pk-yrs)    Types: Cigarettes    Start date: 03/18/1966    Quit date: 03/18/2016    Years since quitting: 8.2   Smokeless tobacco: Never  Vaping Use   Vaping status: Never Used  Substance and Sexual Activity   Alcohol use: Yes    Alcohol/week: 8.0 standard drinks of alcohol    Types: 8 Cans of beer per week    Comment: moderate   Drug use: Never   Sexual activity: Not Currently    Birth control/protection: None  Other Topics Concern   Not on file  Social History Narrative   Retired - working part job at [employer]   Social Drivers of Health   Tobacco Use: Medium Risk (01/04/2024)   Patient History    Smoking Tobacco Use: Former    Smokeless Tobacco Use: Never    Passive Exposure: Not on Surveyor, Minerals Strain: Low Risk (06/23/2024)   Overall Financial Resource Strain (CARDIA)    Difficulty of Paying Living Expenses: Not hard at all  Food Insecurity: No Food Insecurity (06/23/2024)   Epic    Worried About Radiation Protection Practitioner of Food in the Last Year: Never true    Ran Out of Food in the Last Year: Never true  Transportation Needs: No Transportation Needs (06/23/2024)   Epic    Lack of Transportation (Medical): No    Lack of Transportation (Non-Medical): No  Physical Activity: Inactive (06/23/2024)   Exercise Vital Sign    Days of Exercise per Week: 0 days    Minutes of Exercise per Session: Not on file  Stress: No Stress Concern Present (06/23/2024)   [employer] of Occupational Health - Occupational Stress Questionnaire    Feeling of Stress: Not at all  Social Connections: Moderately Isolated (06/23/2024)   Social Connection and Isolation Panel    Frequency of Communication with Friends and Family: Never    Frequency of Social Gatherings with Friends and Family: Three times a week    Attends Religious Services: Never    Active Member of Clubs or Organizations: No    Attends Banker Meetings: Not on file    Marital Status: Living with partner  Intimate Partner Violence: Not At Risk (03/30/2023)   Humiliation, Afraid, Rape, and Kick questionnaire    Fear of Current or Ex-Partner: No    Emotionally Abused: No    Physically Abused: No    Sexually Abused: No  Depression (PHQ2-9): Low Risk (06/27/2024)   Depression (PHQ2-9)    PHQ-2 Score: 0  Alcohol Screen: Low Risk (06/23/2024)   Alcohol Screen    Last Alcohol Screening Score (AUDIT): 3  Housing: Low Risk (06/23/2024)   Epic    Unable to Pay for Housing in the Last Year: No    Number of Times Moved in the Last Year:  0    Homeless in the Last Year: No  Utilities: Not At Risk (03/30/2023)   AHC Utilities    Threatened with loss of utilities: No  Health Literacy: Adequate Health Literacy (03/30/2023)   B1300  Health Literacy    Frequency of need for help with medical instructions: Never    Review of Systems 13 point review of systems per patient health survey noted.  Negative other than as indicated above or in HPI.    Objective:   Vitals:   06/27/24 0835  BP: 128/60  Pulse: 68  Resp: 18  Temp: 98.7 F (37.1 C)  TempSrc: Temporal  SpO2: 96%  Weight: 234 lb 9.6 oz (106.4 kg)  Height: 6' (1.829 m)   {Vitals History (Optional):23777}  Physical Exam Vitals reviewed.  Constitutional:      Appearance: He is well-developed.  HENT:     Head: Normocephalic and atraumatic.     Right Ear: External ear normal.     Left Ear: External ear normal.  Eyes:     Conjunctiva/sclera: Conjunctivae normal.     Pupils: Pupils are equal, round, and reactive to light.  Neck:     Thyroid : No thyromegaly.  Cardiovascular:     Rate and Rhythm: Normal rate and regular rhythm.     Heart sounds: Normal heart sounds.  Pulmonary:     Effort: Pulmonary effort is normal. No respiratory distress.     Breath sounds: Normal breath sounds. No wheezing.  Abdominal:     General: There is no distension.     Palpations: Abdomen is soft.     Tenderness: There is no abdominal tenderness.  Musculoskeletal:        General: No tenderness. Normal range of motion.     Cervical back: Normal range of motion and neck supple.  Lymphadenopathy:     Cervical: No cervical adenopathy.  Skin:    General: Skin is warm and dry.  Neurological:     Mental Status: He is alert and oriented to person, place, and time.     Deep Tendon Reflexes: Reflexes are normal and symmetric.  Psychiatric:        Behavior: Behavior normal.        Assessment & Plan:  Arran Fessel is a 78 y.o. male . Need for influenza vaccination - Plan: Flu vaccine HIGH DOSE PF(Fluzone Trivalent)  Essential hypertension - Plan: TSH  Hyperlipidemia, unspecified hyperlipidemia type - Plan: Lipid panel  DM type 2 with diabetic mixed hyperlipidemia  (HCC) - Plan: Comprehensive metabolic panel with GFR, Hemoglobin A1c  Annual physical exam  Weight gain - Plan: TSH  Class 1 obesity with serious comorbidity and body mass index (BMI) of 31.0 to 31.9 in adult, unspecified obesity type - Plan: TSH   No orders of the defined types were placed in this encounter.  Patient Instructions  Thank you for coming in today. No change in medications at this time.  It appears you are due for dermatology follow-up  Please call and schedule appointment with Dr. Lynnell.  Weight has increased - continue walking, watch food choices. Avoid skipping meals. Protein drink/meal replacement drink like Glucerna may be easier at times if needed.  Depending on labs we may need to consider adding a new med for diabetes that can help protect kidneys. I will let you know.  I would recommend a hearing screening - there are various screening options in town, but let me know if you would like a referral.  If there  are any concerns on your bloodwork, I will let you know. Take care!   Preventive Care 50 Years and Older, Male Preventive care refers to lifestyle choices and visits with your health care provider that can promote health and wellness. Preventive care visits are also called wellness exams. What can I expect for my preventive care visit? Counseling During your preventive care visit, your health care provider may ask about your: Medical history, including: Past medical problems. Family medical history. History of falls. Current health, including: Emotional well-being. Home life and relationship well-being. Sexual activity. Memory and ability to understand (cognition). Lifestyle, including: Alcohol, nicotine or tobacco, and drug use. Access to firearms. Diet, exercise, and sleep habits. Work and work astronomer. Sunscreen use. Safety issues such as seatbelt and bike helmet use. Physical exam Your health care provider will check your: Height and  weight. These may be used to calculate your BMI (body mass index). BMI is a measurement that tells if you are at a healthy weight. Waist circumference. This measures the distance around your waistline. This measurement also tells if you are at a healthy weight and may help predict your risk of certain diseases, such as type 2 diabetes and high blood pressure. Heart rate and blood pressure. Body temperature. Skin for abnormal spots. What immunizations do I need?  Vaccines are usually given at various ages, according to a schedule. Your health care provider will recommend vaccines for you based on your age, medical history, and lifestyle or other factors, such as travel or where you work. What tests do I need? Screening Your health care provider may recommend screening tests for certain conditions. This may include: Lipid and cholesterol levels. Diabetes screening. This is done by checking your blood sugar (glucose) after you have not eaten for a while (fasting). Hepatitis C test. Hepatitis B test. HIV (human immunodeficiency virus) test. STI (sexually transmitted infection) testing, if you are at risk. Lung cancer screening. Colorectal cancer screening. Prostate cancer screening. Abdominal aortic aneurysm (AAA) screening. You may need this if you are a current or former smoker. Talk with your health care provider about your test results, treatment options, and if necessary, the need for more tests. Follow these instructions at home: Eating and drinking  Eat a diet that includes fresh fruits and vegetables, whole grains, lean protein, and low-fat dairy products. Limit your intake of foods with high amounts of sugar, saturated fats, and salt. Take vitamin and mineral supplements as recommended by your health care provider. Do not drink alcohol if your health care provider tells you not to drink. If you drink alcohol: Limit how much you have to 0-2 drinks a day. Know how much alcohol is in  your drink. In the [place] one drink equals one 12 oz bottle of beer (355 mL), one 5 oz glass of wine (148 mL), or one 1 oz glass of hard liquor (44 mL). Lifestyle Brush your teeth every morning and night with fluoride toothpaste. Floss one time each day. Exercise for at least 30 minutes 5 or more days each week. Do not use any products that contain nicotine or tobacco. These products include cigarettes, chewing tobacco, and vaping devices, such as e-cigarettes. If you need help quitting, ask your health care provider. Do not use drugs. If you are sexually active, practice safe sex. Use a condom or other form of protection to prevent STIs. Take aspirin only as told by your health care provider. Make sure that you understand how much to take and what  form to take. Work with your health care provider to find out whether it is safe and beneficial for you to take aspirin daily. Ask your health care provider if you need to take a cholesterol-lowering medicine (statin). Find healthy ways to manage stress, such as: Meditation, yoga, or listening to music. Journaling. Talking to a trusted person. Spending time with friends and family. Safety Always wear your seat belt while driving or riding in a vehicle. Do not drive: If you have been drinking alcohol. Do not ride with someone who has been drinking. When you are tired or distracted. While texting. If you have been using any mind-altering substances or drugs. Wear a helmet and other protective equipment during sports activities. If you have firearms in your house, make sure you follow all gun safety procedures. Minimize exposure to UV radiation to reduce your risk of skin cancer. What's next? Visit your health care provider once a year for an annual wellness visit. Ask your health care provider how often you should have your eyes and teeth checked. Stay up to date on all vaccines. This information is not intended to replace advice given to you by  your health care provider. Make sure you discuss any questions you have with your health care provider. Document Revised: 12/23/2020 Document Reviewed: 12/23/2020 Elsevier Patient Education  2024 Else[employer] Inc.     Signed,   Reyes Pines, MD  Primary Care, [facility] 06/27/2024 9:34 AM      [1]  Allergies Allergen Reactions   Sulfa  Antibiotics Itching

## 2024-06-27 NOTE — Patient Instructions (Addendum)
 Thank you for coming in today. No change in medications at this time.  It appears you are due for dermatology follow-up  Please call and schedule appointment with Dr. Lynnell.  Weight has increased - continue walking, watch food choices. Avoid skipping meals. Protein drink/meal replacement drink like Glucerna may be easier at times if needed.  Depending on labs we may need to consider adding a new med for diabetes that can help protect kidneys. I will let you know.  I would recommend a hearing screening - there are various screening options in town, but let me know if you would like a referral.  If there are any concerns on your bloodwork, I will let you know. Take care!   Preventive Care 66 Years and Older, Male Preventive care refers to lifestyle choices and visits with your health care provider that can promote health and wellness. Preventive care visits are also called wellness exams. What can I expect for my preventive care visit? Counseling During your preventive care visit, your health care provider may ask about your: Medical history, including: Past medical problems. Family medical history. History of falls. Current health, including: Emotional well-being. Home life and relationship well-being. Sexual activity. Memory and ability to understand (cognition). Lifestyle, including: Alcohol, nicotine or tobacco, and drug use. Access to firearms. Diet, exercise, and sleep habits. Work and work astronomer. Sunscreen use. Safety issues such as seatbelt and bike helmet use. Physical exam Your health care provider will check your: Height and weight. These may be used to calculate your BMI (body mass index). BMI is a measurement that tells if you are at a healthy weight. Waist circumference. This measures the distance around your waistline. This measurement also tells if you are at a healthy weight and may help predict your risk of certain diseases, such as type 2 diabetes and high  blood pressure. Heart rate and blood pressure. Body temperature. Skin for abnormal spots. What immunizations do I need?  Vaccines are usually given at various ages, according to a schedule. Your health care provider will recommend vaccines for you based on your age, medical history, and lifestyle or other factors, such as travel or where you work. What tests do I need? Screening Your health care provider may recommend screening tests for certain conditions. This may include: Lipid and cholesterol levels. Diabetes screening. This is done by checking your blood sugar (glucose) after you have not eaten for a while (fasting). Hepatitis C test. Hepatitis B test. HIV (human immunodeficiency virus) test. STI (sexually transmitted infection) testing, if you are at risk. Lung cancer screening. Colorectal cancer screening. Prostate cancer screening. Abdominal aortic aneurysm (AAA) screening. You may need this if you are a current or former smoker. Talk with your health care provider about your test results, treatment options, and if necessary, the need for more tests. Follow these instructions at home: Eating and drinking  Eat a diet that includes fresh fruits and vegetables, whole grains, lean protein, and low-fat dairy products. Limit your intake of foods with high amounts of sugar, saturated fats, and salt. Take vitamin and mineral supplements as recommended by your health care provider. Do not drink alcohol if your health care provider tells you not to drink. If you drink alcohol: Limit how much you have to 0-2 drinks a day. Know how much alcohol is in your drink. In the [place] one drink equals one 12 oz bottle of beer (355 mL), one 5 oz glass of wine (148 mL), or one 1 oz glass  of hard liquor (44 mL). Lifestyle Brush your teeth every morning and night with fluoride toothpaste. Floss one time each day. Exercise for at least 30 minutes 5 or more days each week. Do not use any products that  contain nicotine or tobacco. These products include cigarettes, chewing tobacco, and vaping devices, such as e-cigarettes. If you need help quitting, ask your health care provider. Do not use drugs. If you are sexually active, practice safe sex. Use a condom or other form of protection to prevent STIs. Take aspirin only as told by your health care provider. Make sure that you understand how much to take and what form to take. Work with your health care provider to find out whether it is safe and beneficial for you to take aspirin daily. Ask your health care provider if you need to take a cholesterol-lowering medicine (statin). Find healthy ways to manage stress, such as: Meditation, yoga, or listening to music. Journaling. Talking to a trusted person. Spending time with friends and family. Safety Always wear your seat belt while driving or riding in a vehicle. Do not drive: If you have been drinking alcohol. Do not ride with someone who has been drinking. When you are tired or distracted. While texting. If you have been using any mind-altering substances or drugs. Wear a helmet and other protective equipment during sports activities. If you have firearms in your house, make sure you follow all gun safety procedures. Minimize exposure to UV radiation to reduce your risk of skin cancer. What's next? Visit your health care provider once a year for an annual wellness visit. Ask your health care provider how often you should have your eyes and teeth checked. Stay up to date on all vaccines. This information is not intended to replace advice given to you by your health care provider. Make sure you discuss any questions you have with your health care provider. Document Revised: 12/23/2020 Document Reviewed: 12/23/2020 Elsevier Patient Education  2024 [employer].

## 2024-07-01 ENCOUNTER — Ambulatory Visit: Payer: Self-pay | Admitting: Family Medicine

## 2024-07-02 NOTE — Telephone Encounter (Signed)
 Patient would like to try diet and exercise first before Jardiance

## 2024-07-18 ENCOUNTER — Ambulatory Visit

## 2024-08-05 ENCOUNTER — Encounter: Payer: Self-pay | Admitting: Family Medicine

## 2024-08-05 DIAGNOSIS — C44619 Basal cell carcinoma of skin of left upper limb, including shoulder: Secondary | ICD-10-CM | POA: Insufficient documentation

## 2024-09-25 ENCOUNTER — Ambulatory Visit: Admitting: Family Medicine
